# Patient Record
Sex: Male | Born: 1950
Health system: Southern US, Community
[De-identification: ages and names within clinical notes are randomized; demographics above are authoritative.]

## PROBLEM LIST (undated history)

## (undated) DIAGNOSIS — K219 Gastro-esophageal reflux disease without esophagitis: Secondary | ICD-10-CM

## (undated) DIAGNOSIS — I10 Essential (primary) hypertension: Secondary | ICD-10-CM

## (undated) DIAGNOSIS — C801 Malignant (primary) neoplasm, unspecified: Secondary | ICD-10-CM

## (undated) DIAGNOSIS — E785 Hyperlipidemia, unspecified: Secondary | ICD-10-CM

## (undated) HISTORY — PX: TONSILLECTOMY: SUR1361

## (undated) HISTORY — PX: EYE SURGERY: SHX253

---

## 2000-01-31 ENCOUNTER — Encounter: Payer: Self-pay | Admitting: Family Medicine

## 2000-01-31 ENCOUNTER — Encounter: Admission: RE | Admit: 2000-01-31 | Discharge: 2000-01-31 | Payer: Self-pay | Admitting: Family Medicine

## 2000-02-22 ENCOUNTER — Encounter: Payer: Self-pay | Admitting: Otolaryngology

## 2000-02-22 ENCOUNTER — Encounter: Admission: RE | Admit: 2000-02-22 | Discharge: 2000-02-22 | Payer: Self-pay | Admitting: Otolaryngology

## 2000-02-24 ENCOUNTER — Ambulatory Visit (HOSPITAL_BASED_OUTPATIENT_CLINIC_OR_DEPARTMENT_OTHER): Admission: RE | Admit: 2000-02-24 | Discharge: 2000-02-24 | Payer: Self-pay | Admitting: Otolaryngology

## 2000-07-17 ENCOUNTER — Encounter: Admission: RE | Admit: 2000-07-17 | Discharge: 2000-07-17 | Payer: Self-pay | Admitting: Family Medicine

## 2000-07-17 ENCOUNTER — Encounter: Payer: Self-pay | Admitting: Family Medicine

## 2001-03-07 ENCOUNTER — Encounter: Admission: RE | Admit: 2001-03-07 | Discharge: 2001-03-07 | Payer: Self-pay | Admitting: Family Medicine

## 2001-03-07 ENCOUNTER — Encounter: Payer: Self-pay | Admitting: Family Medicine

## 2001-04-17 ENCOUNTER — Encounter: Payer: Self-pay | Admitting: Family Medicine

## 2001-04-17 ENCOUNTER — Encounter: Admission: RE | Admit: 2001-04-17 | Discharge: 2001-04-17 | Payer: Self-pay | Admitting: Family Medicine

## 2001-04-23 ENCOUNTER — Ambulatory Visit (HOSPITAL_COMMUNITY): Admission: RE | Admit: 2001-04-23 | Discharge: 2001-04-23 | Payer: Self-pay | Admitting: Gastroenterology

## 2001-11-23 ENCOUNTER — Encounter: Payer: Self-pay | Admitting: Family Medicine

## 2001-11-23 ENCOUNTER — Encounter: Admission: RE | Admit: 2001-11-23 | Discharge: 2001-11-23 | Payer: Self-pay | Admitting: Family Medicine

## 2001-12-07 ENCOUNTER — Ambulatory Visit (HOSPITAL_COMMUNITY): Admission: RE | Admit: 2001-12-07 | Discharge: 2001-12-07 | Payer: Self-pay | Admitting: Gastroenterology

## 2002-06-11 ENCOUNTER — Ambulatory Visit (HOSPITAL_COMMUNITY): Admission: RE | Admit: 2002-06-11 | Discharge: 2002-06-11 | Payer: Self-pay | Admitting: Gastroenterology

## 2002-07-25 ENCOUNTER — Encounter: Payer: Self-pay | Admitting: Surgery

## 2002-07-25 ENCOUNTER — Ambulatory Visit (HOSPITAL_COMMUNITY): Admission: RE | Admit: 2002-07-25 | Discharge: 2002-07-25 | Payer: Self-pay | Admitting: Surgery

## 2002-08-26 ENCOUNTER — Encounter: Payer: Self-pay | Admitting: Surgery

## 2002-08-31 ENCOUNTER — Inpatient Hospital Stay (HOSPITAL_COMMUNITY): Admission: RE | Admit: 2002-08-31 | Discharge: 2002-09-02 | Payer: Self-pay | Admitting: Surgery

## 2002-11-26 ENCOUNTER — Encounter: Admission: RE | Admit: 2002-11-26 | Discharge: 2002-11-26 | Payer: Self-pay | Admitting: Family Medicine

## 2002-12-06 ENCOUNTER — Encounter: Admission: RE | Admit: 2002-12-06 | Discharge: 2002-12-06 | Payer: Self-pay | Admitting: Otolaryngology

## 2004-03-10 ENCOUNTER — Encounter: Admission: RE | Admit: 2004-03-10 | Discharge: 2004-03-10 | Payer: Self-pay | Admitting: Family Medicine

## 2006-04-26 ENCOUNTER — Encounter: Admission: RE | Admit: 2006-04-26 | Discharge: 2006-04-26 | Payer: Self-pay | Admitting: Family Medicine

## 2010-02-08 ENCOUNTER — Encounter: Payer: Self-pay | Admitting: Family Medicine

## 2011-08-30 ENCOUNTER — Other Ambulatory Visit: Payer: Self-pay | Admitting: Gastroenterology

## 2013-01-14 ENCOUNTER — Other Ambulatory Visit: Payer: Self-pay | Admitting: Family Medicine

## 2013-01-14 ENCOUNTER — Ambulatory Visit
Admission: RE | Admit: 2013-01-14 | Discharge: 2013-01-14 | Disposition: A | Discharge status: Home / Self Care | Payer: BC Managed Care – PPO | Source: Ambulatory Visit | Attending: Family Medicine | Admitting: Family Medicine

## 2013-01-14 DIAGNOSIS — R053 Chronic cough: Secondary | ICD-10-CM

## 2013-01-14 DIAGNOSIS — R05 Cough: Secondary | ICD-10-CM

## 2014-12-08 ENCOUNTER — Encounter: Payer: Self-pay | Admitting: Internal Medicine

## 2017-02-03 DIAGNOSIS — J209 Acute bronchitis, unspecified: Secondary | ICD-10-CM | POA: Diagnosis not present

## 2017-02-03 DIAGNOSIS — J0100 Acute maxillary sinusitis, unspecified: Secondary | ICD-10-CM | POA: Diagnosis not present

## 2017-02-08 DIAGNOSIS — R69 Illness, unspecified: Secondary | ICD-10-CM | POA: Diagnosis not present

## 2017-03-07 DIAGNOSIS — Z86010 Personal history of colonic polyps: Secondary | ICD-10-CM | POA: Diagnosis not present

## 2017-03-22 DIAGNOSIS — J4530 Mild persistent asthma, uncomplicated: Secondary | ICD-10-CM | POA: Diagnosis not present

## 2017-03-22 DIAGNOSIS — I10 Essential (primary) hypertension: Secondary | ICD-10-CM | POA: Diagnosis not present

## 2017-03-22 DIAGNOSIS — R69 Illness, unspecified: Secondary | ICD-10-CM | POA: Diagnosis not present

## 2017-05-01 DIAGNOSIS — R69 Illness, unspecified: Secondary | ICD-10-CM | POA: Diagnosis not present

## 2017-05-23 DIAGNOSIS — I10 Essential (primary) hypertension: Secondary | ICD-10-CM | POA: Diagnosis not present

## 2017-05-23 DIAGNOSIS — Z7951 Long term (current) use of inhaled steroids: Secondary | ICD-10-CM | POA: Diagnosis not present

## 2017-05-23 DIAGNOSIS — N529 Male erectile dysfunction, unspecified: Secondary | ICD-10-CM | POA: Diagnosis not present

## 2017-05-23 DIAGNOSIS — N400 Enlarged prostate without lower urinary tract symptoms: Secondary | ICD-10-CM | POA: Diagnosis not present

## 2017-05-23 DIAGNOSIS — G4700 Insomnia, unspecified: Secondary | ICD-10-CM | POA: Diagnosis not present

## 2017-05-23 DIAGNOSIS — J309 Allergic rhinitis, unspecified: Secondary | ICD-10-CM | POA: Diagnosis not present

## 2017-05-23 DIAGNOSIS — E7800 Pure hypercholesterolemia, unspecified: Secondary | ICD-10-CM | POA: Diagnosis not present

## 2017-05-23 DIAGNOSIS — K219 Gastro-esophageal reflux disease without esophagitis: Secondary | ICD-10-CM | POA: Diagnosis not present

## 2017-05-23 DIAGNOSIS — R69 Illness, unspecified: Secondary | ICD-10-CM | POA: Diagnosis not present

## 2017-05-23 DIAGNOSIS — J42 Unspecified chronic bronchitis: Secondary | ICD-10-CM | POA: Diagnosis not present

## 2017-08-02 DIAGNOSIS — J209 Acute bronchitis, unspecified: Secondary | ICD-10-CM | POA: Diagnosis not present

## 2017-08-02 DIAGNOSIS — J4530 Mild persistent asthma, uncomplicated: Secondary | ICD-10-CM | POA: Diagnosis not present

## 2017-08-02 DIAGNOSIS — B3789 Other sites of candidiasis: Secondary | ICD-10-CM | POA: Diagnosis not present

## 2017-08-02 DIAGNOSIS — J019 Acute sinusitis, unspecified: Secondary | ICD-10-CM | POA: Diagnosis not present

## 2017-08-15 DIAGNOSIS — R69 Illness, unspecified: Secondary | ICD-10-CM | POA: Diagnosis not present

## 2017-10-03 DIAGNOSIS — I10 Essential (primary) hypertension: Secondary | ICD-10-CM | POA: Diagnosis not present

## 2017-10-03 DIAGNOSIS — R311 Benign essential microscopic hematuria: Secondary | ICD-10-CM | POA: Diagnosis not present

## 2017-10-03 DIAGNOSIS — Z0000 Encounter for general adult medical examination without abnormal findings: Secondary | ICD-10-CM | POA: Diagnosis not present

## 2017-10-03 DIAGNOSIS — R319 Hematuria, unspecified: Secondary | ICD-10-CM | POA: Diagnosis not present

## 2017-10-03 DIAGNOSIS — R7302 Impaired glucose tolerance (oral): Secondary | ICD-10-CM | POA: Diagnosis not present

## 2017-10-03 DIAGNOSIS — R3129 Other microscopic hematuria: Secondary | ICD-10-CM | POA: Diagnosis not present

## 2017-10-03 DIAGNOSIS — R7301 Impaired fasting glucose: Secondary | ICD-10-CM | POA: Diagnosis not present

## 2017-10-03 DIAGNOSIS — Z125 Encounter for screening for malignant neoplasm of prostate: Secondary | ICD-10-CM | POA: Diagnosis not present

## 2017-10-03 DIAGNOSIS — E782 Mixed hyperlipidemia: Secondary | ICD-10-CM | POA: Diagnosis not present

## 2017-10-10 DIAGNOSIS — E782 Mixed hyperlipidemia: Secondary | ICD-10-CM | POA: Diagnosis not present

## 2017-10-10 DIAGNOSIS — I493 Ventricular premature depolarization: Secondary | ICD-10-CM | POA: Diagnosis not present

## 2017-10-10 DIAGNOSIS — J449 Chronic obstructive pulmonary disease, unspecified: Secondary | ICD-10-CM | POA: Diagnosis not present

## 2017-10-10 DIAGNOSIS — K219 Gastro-esophageal reflux disease without esophagitis: Secondary | ICD-10-CM | POA: Diagnosis not present

## 2017-10-10 DIAGNOSIS — Z0000 Encounter for general adult medical examination without abnormal findings: Secondary | ICD-10-CM | POA: Diagnosis not present

## 2017-10-11 DIAGNOSIS — R69 Illness, unspecified: Secondary | ICD-10-CM | POA: Diagnosis not present

## 2017-10-11 DIAGNOSIS — Z1159 Encounter for screening for other viral diseases: Secondary | ICD-10-CM | POA: Diagnosis not present

## 2017-10-25 DIAGNOSIS — R69 Illness, unspecified: Secondary | ICD-10-CM | POA: Diagnosis not present

## 2017-11-06 DIAGNOSIS — H40023 Open angle with borderline findings, high risk, bilateral: Secondary | ICD-10-CM | POA: Diagnosis not present

## 2017-11-06 DIAGNOSIS — H524 Presbyopia: Secondary | ICD-10-CM | POA: Diagnosis not present

## 2018-02-19 DIAGNOSIS — R69 Illness, unspecified: Secondary | ICD-10-CM | POA: Diagnosis not present

## 2018-02-20 DIAGNOSIS — R69 Illness, unspecified: Secondary | ICD-10-CM | POA: Diagnosis not present

## 2018-03-08 ENCOUNTER — Other Ambulatory Visit: Payer: Self-pay | Admitting: Family Medicine

## 2018-03-08 ENCOUNTER — Ambulatory Visit
Admission: RE | Admit: 2018-03-08 | Discharge: 2018-03-08 | Disposition: A | Discharge status: Home / Self Care | Payer: Medicare HMO | Source: Ambulatory Visit | Attending: Family Medicine | Admitting: Family Medicine

## 2018-03-08 DIAGNOSIS — S39012A Strain of muscle, fascia and tendon of lower back, initial encounter: Secondary | ICD-10-CM | POA: Diagnosis not present

## 2018-03-08 DIAGNOSIS — S90211A Contusion of right great toe with damage to nail, initial encounter: Secondary | ICD-10-CM | POA: Diagnosis not present

## 2018-03-08 DIAGNOSIS — L03031 Cellulitis of right toe: Secondary | ICD-10-CM | POA: Diagnosis not present

## 2018-03-08 DIAGNOSIS — S90111A Contusion of right great toe without damage to nail, initial encounter: Secondary | ICD-10-CM

## 2018-03-08 DIAGNOSIS — S92421A Displaced fracture of distal phalanx of right great toe, initial encounter for closed fracture: Secondary | ICD-10-CM | POA: Diagnosis not present

## 2018-03-09 ENCOUNTER — Ambulatory Visit (INDEPENDENT_AMBULATORY_CARE_PROVIDER_SITE_OTHER): Payer: Medicare HMO | Admitting: Orthopaedic Surgery

## 2018-03-09 DIAGNOSIS — S92424A Nondisplaced fracture of distal phalanx of right great toe, initial encounter for closed fracture: Secondary | ICD-10-CM | POA: Diagnosis not present

## 2018-03-09 DIAGNOSIS — Z23 Encounter for immunization: Secondary | ICD-10-CM | POA: Diagnosis not present

## 2018-03-09 MED ORDER — AMOXICILLIN-POT CLAVULANATE 875-125 MG PO TABS: 1.0000 | ORAL_TABLET | Freq: Two times a day (BID) | ORAL | 0 refills | Status: DC

## 2018-03-09 NOTE — Progress Notes (Signed)
Office Visit Note   Patient: Jerry Cook           Date of Birth: [birth year]           MRN: [id number] Visit Date: 03/09/2018              Requested by: Jerry Late, MD [address] [place], [place] [address] PCP: Jerry Late, MD   Assessment & Plan: Visit Diagnoses:  1. Nondisplaced fracture of distal phalanx of right great toe, initial encounter for closed fracture     Plan: Impression is subacute right great toe nailbed and nail injury.  History is consistent with a nailbed hematoma which he evacuated himself.  I will change him to Augmentin for 10 days.  He is to stop taking the Keflex.  We discussed that the current nail will fall off and a new nail will take several months to grow in.  We will keep the current nail on in order to protect the nailbed.  The x-rays are consistent with a nondisplaced distal phalanx fracture.  The air on the x-ray is consistent with nailbed injury which allowed air to escape into the toe.  This should should heal fine without any issues.  He is likely to have sensitivity to the toe for several months.  I would like to recheck him in 2 weeks.  Questions encouraged and answered.  Follow-Up Instructions: Return in about 2 weeks (around 03/23/2018).   Orders:  No orders of the defined types were placed in this encounter.  Meds ordered this encounter  Medications  . amoxicillin-clavulanate (AUGMENTIN) 875-125 MG tablet    Sig: Take 1 tablet by mouth 2 (two) times daily.    Dispense:  14 tablet    Refill:  0      Procedures: No procedures performed   Clinical Data: No additional findings.   Subjective: No chief complaint on file.   Jerry Cook is a 68 year old gentleman who has been sent over today as an urgent consult from his PCPs office Dr. Sandi Cook for evaluation of right great toe injury.  He works at the [employer] on [address] and was [employer] when he stubbed his right great toe on a wheel.  He subsequently  developed significant swelling and a blood blister which he then decompressed himself with a sterilized needle.  He endorses soreness and discomfort and swelling in the right great toe.  He denies any constitutional symptoms.  Denies any continued drainage.  Initially there was bloody drainage from the hematoma.  The swelling is overall improved from the initial injury.  He was seen by his PCP today and x-rays were also taken.  He was just started on Keflex.  He is not diabetic.   Review of Systems  Constitutional: Negative.   All other systems reviewed and are negative.    Objective: Vital Signs: There were no vitals taken for this visit.  Physical Exam Vitals signs and nursing note reviewed.  Constitutional:      Appearance: He is well-developed.  HENT:     Head: Normocephalic and atraumatic.  Eyes:     Pupils: Pupils are equal, round, and reactive to light.  Neck:     Musculoskeletal: Neck supple.  Pulmonary:     Effort: Pulmonary effort is normal.  Abdominal:     Palpations: Abdomen is soft.  Musculoskeletal: Normal range of motion.  Skin:    General: Skin is warm.  Neurological:     Mental Status: He is  alert and oriented to person, place, and time.  Psychiatric:        Behavior: Behavior normal.        Thought Content: Thought content normal.        Judgment: Judgment normal.     Ortho Exam Right great toe exam shows mild swelling and a purplish hue consistent with mild venous congestion.  There is good blood flow to the toe with normal capillary refill.  Sensation is intact.  He is tender around the cuticle.  The nail appears to be injured and the findings are consistent with a subacute nailbed injury with nailbed hematoma.  There is no signs of infection.  The nail is adhered to the nailbed. Specialty Comments:  No specialty comments available.  Imaging: Dg Toe Great Right  Result Date: 03/08/2018 CLINICAL DATA:  68 year old male with a history of first toe  injury EXAM: RIGHT GREAT TOE COMPARISON:  None. FINDINGS: There is a fracture of the distal tuft of the distal phalanx of the first toe right foot. Minimally displaced. There is rounded lucency on the dorsal aspect of the great toe, centered within the soft tissues underneath the nail. No radiopaque foreign body. IMPRESSION: Fracture of the distal tuft of the distal phalanx first toe, right foot. There is lucency within the soft tissues of the dorsal first toe, which appears underneath the nail bed. The etiology of the gas is uncertain, however, differential would include penetrating injury and thus definition of an open fracture, or gas-forming organism in the setting of infection. Electronically Signed   By: Corrie Mckusick D.O.   On: 03/08/2018 17:00     PMFS History: There are no active problems to display for this patient.  No past medical history on file.  No family history on file.   Social History   Occupational History  . Not on file  Tobacco Use  . Smoking status: Not on file  Substance and Sexual Activity  . Alcohol use: Not on file  . Drug use: Not on file  . Sexual activity: Not on file

## 2018-03-23 ENCOUNTER — Encounter (INDEPENDENT_AMBULATORY_CARE_PROVIDER_SITE_OTHER): Payer: Self-pay | Admitting: Orthopaedic Surgery

## 2018-03-23 ENCOUNTER — Ambulatory Visit (INDEPENDENT_AMBULATORY_CARE_PROVIDER_SITE_OTHER): Payer: Medicare HMO | Admitting: Orthopaedic Surgery

## 2018-03-23 DIAGNOSIS — S92424A Nondisplaced fracture of distal phalanx of right great toe, initial encounter for closed fracture: Secondary | ICD-10-CM

## 2018-03-23 DIAGNOSIS — S92424D Nondisplaced fracture of distal phalanx of right great toe, subsequent encounter for fracture with routine healing: Secondary | ICD-10-CM

## 2018-03-23 NOTE — Progress Notes (Signed)
    Patient: Jerry Cook           Date of Birth: 08-23-1950           MRN: [id number] Visit Date: 03/23/2018 PCP: Derinda Late, MD   Assessment & Plan:  Chief Complaint:  Chief Complaint  Patient presents with  . Right Great Toe - Pain   Visit Diagnoses:  1. Nondisplaced fracture of distal phalanx of right great toe, initial encounter for closed fracture     Plan: Jerry Cook returns today for follow-up of his great toe injury.  He is feeling much better.  He has completed his course of Augmentin.  Denies any constitutional symptoms. His toenail is now become detached from the cuticle.  I can see that a new nail is starting to form underneath.  There is no evidence of infection.  His toe is mildly tender.  Overall the swelling is improved.  At this point he may begin weaning out of the postop shoe to a regular shoe as tolerated.  He is doing well from my standpoint.  I would like to recheck in 1 more time in about 6 weeks.  We would expect the old nail to slough off over the next couple months as the new nail was coming in.  Follow-Up Instructions: Return in about 6 weeks (around 05/04/2018).   Orders:  No orders of the defined types were placed in this encounter.  No orders of the defined types were placed in this encounter.   Imaging: No results found.  PMFS History: There are no active problems to display for this patient.  History reviewed. No pertinent past medical history.  History reviewed. No pertinent family history.  History reviewed. No pertinent surgical history. Social History   Occupational History  . Not on file  Tobacco Use  . Smoking status: Not on file  Substance and Sexual Activity  . Alcohol use: Not on file  . Drug use: Not on file  . Sexual activity: Not on file

## 2018-04-11 DIAGNOSIS — R75 Inconclusive laboratory evidence of human immunodeficiency virus [HIV]: Secondary | ICD-10-CM | POA: Diagnosis not present

## 2018-04-11 DIAGNOSIS — R972 Elevated prostate specific antigen [PSA]: Secondary | ICD-10-CM | POA: Diagnosis not present

## 2018-05-04 ENCOUNTER — Ambulatory Visit (INDEPENDENT_AMBULATORY_CARE_PROVIDER_SITE_OTHER): Payer: Medicare HMO | Admitting: Orthopaedic Surgery

## 2018-05-08 ENCOUNTER — Ambulatory Visit (INDEPENDENT_AMBULATORY_CARE_PROVIDER_SITE_OTHER): Payer: Medicare HMO

## 2018-05-08 ENCOUNTER — Other Ambulatory Visit: Payer: Self-pay

## 2018-05-08 ENCOUNTER — Ambulatory Visit (INDEPENDENT_AMBULATORY_CARE_PROVIDER_SITE_OTHER): Payer: Medicare HMO | Admitting: Orthopaedic Surgery

## 2018-05-08 DIAGNOSIS — S92424A Nondisplaced fracture of distal phalanx of right great toe, initial encounter for closed fracture: Secondary | ICD-10-CM | POA: Diagnosis not present

## 2018-05-08 NOTE — Progress Notes (Signed)
   Office Visit Note   Patient: Jerry Cook           Date of Birth: [birth year]           MRN: [id number] Visit Date: 05/08/2018              Requested by: Derinda Late, MD [address] [place], [place] [address] PCP: Derinda Late, MD   Assessment & Plan: Visit Diagnoses:  1. Nondisplaced fracture of distal phalanx of right great toe, initial encounter for closed fracture     Plan: Impression is healed right great toe distal phalanx fracture.  His toenail has almost fully grown in.  He will continue to advance with activity as tolerated.  Call with concerns or questions.  Follow-up with [place] as needed.  Follow-Up Instructions: Return if symptoms worsen or fail to improve.   Orders:  Orders Placed This Encounter  Procedures  . XR Toe Great Right   No orders of the defined types were placed in this encounter.     Procedures: No procedures performed   Clinical Data: No additional findings.   Subjective: Chief Complaint  Patient presents with  . Right Great Toe - Follow-up    HPI patient is a pleasant 68 year old gentleman who presents our clinic today approximately 2 months status post right great toe distal phalanx fracture, date of injury 03/08/2018.  He has been doing well.  He notes that his toenail fell off approximately 2 weeks ago which is relieved a lot of pressure.  No fevers or chills.  Minimal pain.  Weightbearing in regular shoe without any issues.    Review of Systems as detailed in HPI.  All others reviewed and are negative.   Objective: Vital Signs: There were no vitals taken for this visit.  Physical Exam well-developed well-nourished gentleman in no acute distress.  Alert and oriented x3.  Ortho Exam examination of the right great toe reveals minimal diffuse erythema.  Very minimal tenderness throughout.  No drainage.  No signs of infection.  His toenail has almost fully grown in.  Neurovascular intact distally.  Specialty Comments:  No  specialty comments available.  Imaging: Xr Toe Great Right  Result Date: 05/08/2018 X-rays demonstrate stable alignment of the fracture with significant bony callus formation    PMFS History: Patient Active Problem List   Diagnosis Date Noted  . Nondisplaced fracture of distal phalanx of right great toe, initial encounter for closed fracture 05/08/2018   No past medical history on file.  No family history on file.  No past surgical history on file. Social History   Occupational History  . Not on file  Tobacco Use  . Smoking status: Not on file  Substance and Sexual Activity  . Alcohol use: Not on file  . Drug use: Not on file  . Sexual activity: Not on file

## 2018-06-22 DIAGNOSIS — R69 Illness, unspecified: Secondary | ICD-10-CM | POA: Diagnosis not present

## 2018-09-06 DIAGNOSIS — R69 Illness, unspecified: Secondary | ICD-10-CM | POA: Diagnosis not present

## 2018-10-10 DIAGNOSIS — R69 Illness, unspecified: Secondary | ICD-10-CM | POA: Diagnosis not present

## 2019-01-14 DIAGNOSIS — N401 Enlarged prostate with lower urinary tract symptoms: Secondary | ICD-10-CM | POA: Diagnosis not present

## 2019-01-14 DIAGNOSIS — Z125 Encounter for screening for malignant neoplasm of prostate: Secondary | ICD-10-CM | POA: Diagnosis not present

## 2019-01-14 DIAGNOSIS — E782 Mixed hyperlipidemia: Secondary | ICD-10-CM | POA: Diagnosis not present

## 2019-01-14 DIAGNOSIS — R69 Illness, unspecified: Secondary | ICD-10-CM | POA: Diagnosis not present

## 2019-01-14 DIAGNOSIS — N528 Other male erectile dysfunction: Secondary | ICD-10-CM | POA: Diagnosis not present

## 2019-01-14 DIAGNOSIS — K219 Gastro-esophageal reflux disease without esophagitis: Secondary | ICD-10-CM | POA: Diagnosis not present

## 2019-01-14 DIAGNOSIS — Z0000 Encounter for general adult medical examination without abnormal findings: Secondary | ICD-10-CM | POA: Diagnosis not present

## 2019-01-14 DIAGNOSIS — I10 Essential (primary) hypertension: Secondary | ICD-10-CM | POA: Diagnosis not present

## 2019-01-14 DIAGNOSIS — R351 Nocturia: Secondary | ICD-10-CM | POA: Diagnosis not present

## 2019-01-14 DIAGNOSIS — R739 Hyperglycemia, unspecified: Secondary | ICD-10-CM | POA: Diagnosis not present

## 2019-01-21 DIAGNOSIS — H40023 Open angle with borderline findings, high risk, bilateral: Secondary | ICD-10-CM | POA: Diagnosis not present

## 2019-01-21 DIAGNOSIS — H524 Presbyopia: Secondary | ICD-10-CM | POA: Diagnosis not present

## 2019-03-05 DIAGNOSIS — R69 Illness, unspecified: Secondary | ICD-10-CM | POA: Diagnosis not present

## 2019-03-20 DIAGNOSIS — S39012A Strain of muscle, fascia and tendon of lower back, initial encounter: Secondary | ICD-10-CM | POA: Diagnosis not present

## 2019-04-18 DIAGNOSIS — M545 Low back pain: Secondary | ICD-10-CM | POA: Diagnosis not present

## 2019-04-20 DIAGNOSIS — Z008 Encounter for other general examination: Secondary | ICD-10-CM | POA: Diagnosis not present

## 2019-04-20 DIAGNOSIS — N400 Enlarged prostate without lower urinary tract symptoms: Secondary | ICD-10-CM | POA: Diagnosis not present

## 2019-04-20 DIAGNOSIS — E663 Overweight: Secondary | ICD-10-CM | POA: Diagnosis not present

## 2019-04-20 DIAGNOSIS — R69 Illness, unspecified: Secondary | ICD-10-CM | POA: Diagnosis not present

## 2019-04-20 DIAGNOSIS — I10 Essential (primary) hypertension: Secondary | ICD-10-CM | POA: Diagnosis not present

## 2019-04-20 DIAGNOSIS — G4700 Insomnia, unspecified: Secondary | ICD-10-CM | POA: Diagnosis not present

## 2019-04-20 DIAGNOSIS — M62838 Other muscle spasm: Secondary | ICD-10-CM | POA: Diagnosis not present

## 2019-04-20 DIAGNOSIS — I739 Peripheral vascular disease, unspecified: Secondary | ICD-10-CM | POA: Diagnosis not present

## 2019-04-20 DIAGNOSIS — J309 Allergic rhinitis, unspecified: Secondary | ICD-10-CM | POA: Diagnosis not present

## 2019-04-20 DIAGNOSIS — K219 Gastro-esophageal reflux disease without esophagitis: Secondary | ICD-10-CM | POA: Diagnosis not present

## 2019-04-20 DIAGNOSIS — E785 Hyperlipidemia, unspecified: Secondary | ICD-10-CM | POA: Diagnosis not present

## 2019-06-13 DIAGNOSIS — Z0100 Encounter for examination of eyes and vision without abnormal findings: Secondary | ICD-10-CM | POA: Diagnosis not present

## 2019-09-18 DIAGNOSIS — R69 Illness, unspecified: Secondary | ICD-10-CM | POA: Diagnosis not present

## 2019-10-15 DIAGNOSIS — R69 Illness, unspecified: Secondary | ICD-10-CM | POA: Diagnosis not present

## 2019-12-23 DIAGNOSIS — R69 Illness, unspecified: Secondary | ICD-10-CM | POA: Diagnosis not present

## 2020-01-28 DIAGNOSIS — E782 Mixed hyperlipidemia: Secondary | ICD-10-CM | POA: Diagnosis not present

## 2020-01-28 DIAGNOSIS — I10 Essential (primary) hypertension: Secondary | ICD-10-CM | POA: Diagnosis not present

## 2020-01-28 DIAGNOSIS — Z125 Encounter for screening for malignant neoplasm of prostate: Secondary | ICD-10-CM | POA: Diagnosis not present

## 2020-01-28 DIAGNOSIS — K219 Gastro-esophageal reflux disease without esophagitis: Secondary | ICD-10-CM | POA: Diagnosis not present

## 2020-01-28 DIAGNOSIS — Z1159 Encounter for screening for other viral diseases: Secondary | ICD-10-CM | POA: Diagnosis not present

## 2020-01-28 DIAGNOSIS — R69 Illness, unspecified: Secondary | ICD-10-CM | POA: Diagnosis not present

## 2020-01-28 DIAGNOSIS — N528 Other male erectile dysfunction: Secondary | ICD-10-CM | POA: Diagnosis not present

## 2020-01-28 DIAGNOSIS — N401 Enlarged prostate with lower urinary tract symptoms: Secondary | ICD-10-CM | POA: Diagnosis not present

## 2020-01-28 DIAGNOSIS — Z0000 Encounter for general adult medical examination without abnormal findings: Secondary | ICD-10-CM | POA: Diagnosis not present

## 2020-03-03 ENCOUNTER — Encounter (HOSPITAL_COMMUNITY): Payer: Self-pay | Admitting: Emergency Medicine

## 2020-03-03 ENCOUNTER — Emergency Department (HOSPITAL_COMMUNITY): Payer: Medicare HMO

## 2020-03-03 ENCOUNTER — Other Ambulatory Visit: Payer: Self-pay

## 2020-03-03 ENCOUNTER — Observation Stay (HOSPITAL_COMMUNITY)
Admission: EM | Admit: 2020-03-03 | Discharge: 2020-03-04 | Disposition: A | Discharge status: Home / Self Care | Payer: Medicare HMO | Attending: Internal Medicine | Admitting: Internal Medicine

## 2020-03-03 ENCOUNTER — Ambulatory Visit (HOSPITAL_COMMUNITY)
Admission: EM | Admit: 2020-03-03 | Discharge: 2020-03-03 | Disposition: A | Discharge status: Home / Self Care | Payer: Medicare HMO

## 2020-03-03 DIAGNOSIS — R042 Hemoptysis: Secondary | ICD-10-CM | POA: Diagnosis not present

## 2020-03-03 DIAGNOSIS — K921 Melena: Secondary | ICD-10-CM

## 2020-03-03 DIAGNOSIS — Z87891 Personal history of nicotine dependence: Secondary | ICD-10-CM | POA: Insufficient documentation

## 2020-03-03 DIAGNOSIS — C01 Malignant neoplasm of base of tongue: Secondary | ICD-10-CM | POA: Diagnosis not present

## 2020-03-03 DIAGNOSIS — C770 Secondary and unspecified malignant neoplasm of lymph nodes of head, face and neck: Secondary | ICD-10-CM | POA: Diagnosis not present

## 2020-03-03 DIAGNOSIS — R591 Generalized enlarged lymph nodes: Secondary | ICD-10-CM

## 2020-03-03 DIAGNOSIS — Z79899 Other long term (current) drug therapy: Secondary | ICD-10-CM | POA: Diagnosis not present

## 2020-03-03 DIAGNOSIS — K625 Hemorrhage of anus and rectum: Secondary | ICD-10-CM | POA: Diagnosis not present

## 2020-03-03 DIAGNOSIS — Z20822 Contact with and (suspected) exposure to covid-19: Secondary | ICD-10-CM | POA: Insufficient documentation

## 2020-03-03 DIAGNOSIS — J069 Acute upper respiratory infection, unspecified: Secondary | ICD-10-CM | POA: Diagnosis not present

## 2020-03-03 DIAGNOSIS — J449 Chronic obstructive pulmonary disease, unspecified: Secondary | ICD-10-CM | POA: Diagnosis not present

## 2020-03-03 DIAGNOSIS — K922 Gastrointestinal hemorrhage, unspecified: Secondary | ICD-10-CM | POA: Diagnosis not present

## 2020-03-03 DIAGNOSIS — R0602 Shortness of breath: Secondary | ICD-10-CM | POA: Diagnosis not present

## 2020-03-03 DIAGNOSIS — I10 Essential (primary) hypertension: Secondary | ICD-10-CM | POA: Diagnosis not present

## 2020-03-03 DIAGNOSIS — K920 Hematemesis: Secondary | ICD-10-CM

## 2020-03-03 DIAGNOSIS — R599 Enlarged lymph nodes, unspecified: Secondary | ICD-10-CM | POA: Diagnosis not present

## 2020-03-03 DIAGNOSIS — C799 Secondary malignant neoplasm of unspecified site: Secondary | ICD-10-CM | POA: Diagnosis not present

## 2020-03-03 DIAGNOSIS — K118 Other diseases of salivary glands: Secondary | ICD-10-CM | POA: Diagnosis not present

## 2020-03-03 DIAGNOSIS — K148 Other diseases of tongue: Secondary | ICD-10-CM | POA: Diagnosis not present

## 2020-03-03 DIAGNOSIS — N400 Enlarged prostate without lower urinary tract symptoms: Secondary | ICD-10-CM

## 2020-03-03 DIAGNOSIS — M50222 Other cervical disc displacement at C5-C6 level: Secondary | ICD-10-CM | POA: Diagnosis not present

## 2020-03-03 HISTORY — DX: Hyperlipidemia, unspecified: E78.5

## 2020-03-03 HISTORY — DX: Essential (primary) hypertension: I10

## 2020-03-03 LAB — CBC
HCT: 37 % — ABNORMAL LOW (ref 39.0–52.0)
Hemoglobin: 12 g/dL — ABNORMAL LOW (ref 13.0–17.0)
MCH: 29.6 pg (ref 26.0–34.0)
MCHC: 32.4 g/dL (ref 30.0–36.0)
MCV: 91.1 fL (ref 80.0–100.0)
Platelets: 155 10*3/uL (ref 150–400)
RBC: 4.06 MIL/uL — ABNORMAL LOW (ref 4.22–5.81)
RDW: 12.5 % (ref 11.5–15.5)
WBC: 9 10*3/uL (ref 4.0–10.5)
nRBC: 0 % (ref 0.0–0.2)

## 2020-03-03 LAB — COMPREHENSIVE METABOLIC PANEL
ALT: 32 U/L (ref 0–44)
AST: 27 U/L (ref 15–41)
Albumin: 3.3 g/dL — ABNORMAL LOW (ref 3.5–5.0)
Alkaline Phosphatase: 59 U/L (ref 38–126)
Anion gap: 9 (ref 5–15)
BUN: 23 mg/dL (ref 8–23)
CO2: 29 mmol/L (ref 22–32)
Calcium: 8.6 mg/dL — ABNORMAL LOW (ref 8.9–10.3)
Chloride: 104 mmol/L (ref 98–111)
Creatinine, Ser: 1.17 mg/dL (ref 0.61–1.24)
GFR, Estimated: >60 mL/min (ref >60)
Glucose, Bld: 110 mg/dL — ABNORMAL HIGH (ref 70–99)
Potassium: 3.5 mmol/L (ref 3.5–5.1)
Sodium: 142 mmol/L (ref 135–145)
Total Bilirubin: 0.5 mg/dL (ref 0.3–1.2)
Total Protein: 6 g/dL — ABNORMAL LOW (ref 6.5–8.1)

## 2020-03-03 LAB — POC OCCULT BLOOD, ED: Fecal Occult Bld: POSITIVE — AB

## 2020-03-03 LAB — PROTIME-INR
INR: 1.2 (ref 0.8–1.2)
Prothrombin Time: 14.4 seconds (ref 11.4–15.2)

## 2020-03-03 LAB — TYPE AND SCREEN
ABO/RH(D): O POS
Antibody Screen: NEGATIVE

## 2020-03-03 LAB — SARS CORONAVIRUS 2 (TAT 6-24 HRS): SARS Coronavirus 2: NEGATIVE

## 2020-03-03 MED ORDER — FINASTERIDE 5 MG PO TABS
5.0000 mg | ORAL_TABLET | Freq: Every day | ORAL | Status: DC
  Administered 2020-03-04: 5 mg via ORAL
  Filled 2020-03-03 (×2): qty 1

## 2020-03-03 MED ORDER — ATORVASTATIN CALCIUM 10 MG PO TABS
10.0000 mg | ORAL_TABLET | Freq: Every day | ORAL | Status: DC
  Administered 2020-03-03 – 2020-03-04 (×2): 10 mg via ORAL
  Filled 2020-03-03 (×2): qty 1

## 2020-03-03 MED ORDER — MOMETASONE FURO-FORMOTEROL FUM 200-5 MCG/ACT IN AERO: 2.0000 | INHALATION_SPRAY | Freq: Two times a day (BID) | RESPIRATORY_TRACT | Status: DC

## 2020-03-03 MED ORDER — ALBUTEROL SULFATE HFA 108 (90 BASE) MCG/ACT IN AERS: 1.0000 | INHALATION_SPRAY | RESPIRATORY_TRACT | Status: DC | PRN

## 2020-03-03 MED ORDER — TRAZODONE HCL 50 MG PO TABS
50.0000 mg | ORAL_TABLET | Freq: Every day | ORAL | Status: DC
  Administered 2020-03-03: 50 mg via ORAL
  Filled 2020-03-03: qty 1

## 2020-03-03 MED ORDER — SILDENAFIL CITRATE 20 MG PO TABS: 20.0000 mg | ORAL_TABLET | Freq: Three times a day (TID) | ORAL | Status: DC

## 2020-03-03 MED ORDER — TRIAMTERENE-HCTZ 37.5-25 MG PO TABS
1.0000 | ORAL_TABLET | Freq: Every day | ORAL | Status: DC
  Filled 2020-03-03: qty 1

## 2020-03-03 MED ORDER — ACETAMINOPHEN 325 MG PO TABS: 650.0000 mg | ORAL_TABLET | Freq: Four times a day (QID) | ORAL | Status: DC | PRN

## 2020-03-03 MED ORDER — TAMSULOSIN HCL 0.4 MG PO CAPS
0.4000 mg | ORAL_CAPSULE | Freq: Every day | ORAL | Status: DC
  Administered 2020-03-03: 0.4 mg via ORAL
  Filled 2020-03-03: qty 1

## 2020-03-03 MED ORDER — IOHEXOL 300 MG/ML  SOLN
75.0000 mL | Freq: Once | INTRAMUSCULAR | Status: AC | PRN
  Administered 2020-03-03: 75 mL via INTRAVENOUS

## 2020-03-03 MED ORDER — PANTOPRAZOLE SODIUM 40 MG PO TBEC
40.0000 mg | DELAYED_RELEASE_TABLET | Freq: Every day | ORAL | Status: DC
  Administered 2020-03-04: 40 mg via ORAL
  Filled 2020-03-03: qty 1

## 2020-03-03 MED ORDER — METOPROLOL TARTRATE 12.5 MG HALF TABLET
12.5000 mg | ORAL_TABLET | Freq: Two times a day (BID) | ORAL | Status: DC
  Administered 2020-03-03 – 2020-03-04 (×2): 12.5 mg via ORAL
  Filled 2020-03-03 (×2): qty 1

## 2020-03-03 MED ORDER — PAROXETINE HCL 20 MG PO TABS
20.0000 mg | ORAL_TABLET | Freq: Every day | ORAL | Status: DC
  Administered 2020-03-04: 20 mg via ORAL
  Filled 2020-03-03 (×2): qty 1

## 2020-03-03 MED ORDER — ACETAMINOPHEN 650 MG RE SUPP: 650.0000 mg | Freq: Four times a day (QID) | RECTAL | Status: DC | PRN

## 2020-03-03 MED ORDER — TRIAMTERENE-HCTZ 37.5-25 MG PO CAPS
1.0000 | ORAL_CAPSULE | Freq: Every day | ORAL | Status: DC
  Filled 2020-03-03: qty 1

## 2020-03-03 MED ORDER — PANTOPRAZOLE SODIUM 40 MG IV SOLR
40.0000 mg | Freq: Once | INTRAVENOUS | Status: AC
  Administered 2020-03-03: 40 mg via INTRAVENOUS
  Filled 2020-03-03: qty 40

## 2020-03-03 NOTE — ED Notes (Signed)
Pt stood up to use urinal in room and had BM in pants and in bedside commode. This RN helped clean pt up, mesh underwear given to pt, pt put into clean gown. This RN hooked pt back up to monitor.

## 2020-03-03 NOTE — ED Triage Notes (Signed)
Patient here from home. Complaint of vomiting blood and dark stools for 1 week. Patient states he has hard dark stools in the past. NAD.

## 2020-03-03 NOTE — ED Triage Notes (Signed)
Pt presents with blood in sputum and dark stools with blood but not black stool xs 2-3 weeks. Denies abdominal pain, SOB or fever. Also states has swelling in lymph nodes with sore throat "that has subsided for the most part."

## 2020-03-03 NOTE — ED Provider Notes (Signed)
I assumed care of patient at shift change from [facility], please see his note for full H&P.  Briefly patient is here for evaluation of rectal bleeding and spitting out phlegm with blood in it.  Physical Exam  BP 133/71   Pulse 63   Temp (!) 97.5 F (36.4 C) (Oral)   Resp 12   SpO2 99%   Physical Exam Vitals and nursing note reviewed.  Constitutional:      General: He is not in acute distress.    Appearance: He is not diaphoretic.  HENT:     Head: Normocephalic and atraumatic.     Mouth/Throat:     Mouth: Mucous membranes are moist.     Pharynx: Oropharynx is clear.     Comments: No obvious intraoral mass or edema.  Voice is hoarse sounding.  Eyes:     General: No scleral icterus.       Right eye: No discharge.        Left eye: No discharge.     Conjunctiva/sclera: Conjunctivae normal.  Neck:     Comments: There is obvious visualized asymmetric edema of the neck.  On palpation these areas are firm consistent with markedly enlarged lymph nodes.  They are nontender. Cardiovascular:     Rate and Rhythm: Normal rate.  Pulmonary:     Effort: Pulmonary effort is normal. No respiratory distress.     Breath sounds: No stridor.  Abdominal:     General: There is no distension.  Musculoskeletal:        General: No deformity.  Skin:    General: Skin is warm and dry.  Neurological:     General: No focal deficit present.     Mental Status: He is alert and oriented to person, place, and time.     Motor: No abnormal muscle tone.  Psychiatric:     Comments: Mood and behavior are appropriate for situation     ED Course/Procedures      Procedures   DG Chest 2 View  Result Date: 03/03/2020 CLINICAL DATA:  Hemoptysis. EXAM: CHEST - 2 VIEW COMPARISON:  01/14/2013. FINDINGS: Mediastinum and hilar structures normal. Heart size normal. No focal infiltrate. No pleural effusion or pneumothorax. Degenerative change thoracic spine. IMPRESSION: No acute cardiopulmonary disease.  Electronically Signed   By: Marcello Moores  Register   On: 03/03/2020 15:14   CT Soft Tissue Neck W Contrast  Result Date: 03/03/2020 CLINICAL DATA:  Lymphadenopathy, neck. Enlarged lymph nodes, shortness of breath, hemoptysis. EXAM: CT NECK WITH CONTRAST TECHNIQUE: Multidetector CT imaging of the neck was performed using the standard protocol following the bolus administration of intravenous contrast. CONTRAST:  67mL OMNIPAQUE IOHEXOL 300 MG/ML  SOLN COMPARISON:  CT of the paranasal sinuses 04/26/2006. FINDINGS: Pharynx and larynx: Prominent streak and beam hardening artifact arising from dental restoration obscures portions of the oral cavity and pharynx. Asymmetric soft tissue within the right base of tongue and glossotonsillar sulcus regions, extending to the right vallecula and measuring 2.5 x 1.9 x 3.0 cm, likely reflecting a base of tongue squamous cell carcinoma (for instance as seen on series 3, image 31). Salivary glands: There is an ill-defined fat pain between right level 2 cystic/necrotic lymphadenopathy and portions of the right parotid and submandibular glands. The parotid and submandibular glands are otherwise unremarkable. Thyroid: Unremarkable. Lymph nodes: Extensive cystic/necrotic cervical lymphadenopathy at the right level 1, 2, 3 and 4 stations and at the left level 2 station. Cystic/necrotic right level 1 lymph nodes measure up  to 2.3 cm in short axis (series 3, image 41). Multiple enlarged cystic/necrotic right level 2 lymph nodes, including a dominant right level 2 nodal conglomerate measuring 4.9 x 4.6 cm in transaxial dimensions (series 3, image 29). This nodal conglomerate has ill-defined margins with the adjacent right sternocleidomastoid muscle, right parotid gland and right submandibular gland and findings are highly suspicious for extracapsular extension. The largest right level 3 lymph node is cystic/necrotic and measures 1.4 cm in short axis (series 3, image 58). An enlarged  cystic/necrotic right level 3/4 lymph node measures 1.7 cm in short axis (series 3, image 66). Multiple cystic/necrotic left level 2 lymph nodes measuring up to 2.3 cm in short axis (series 3, images 29 and 36). An enlarged cystic/necrotic left level 2 lymph node has an ill-defined margin with the adjacent left sternocleidomastoid muscle and findings are suspicious for extracapsular extension. Vascular: Cystic/necrotic lymphadenopathy near completely effaces the upper right internal jugular vein. Cystic/necrotic lymphadenopathy also significantly narrows the upper left internal jugular vein. Limited intracranial: No acute intracranial abnormality identified at the imaged levels. Visualized orbits: Excluded from the field of view. Mastoids and visualized paranasal sinuses: Incompletely imaged sequela of prior maxillofacial reconstructive surgery. Moderate mucosal thickening and possible fluid within the imaged right maxillary sinus. Mild mucosal thickening within the imaged left maxillary sinus. No significant mastoid effusion. Skeleton: No acute bony abnormality or aggressive osseous lesion. C4-C5 grade 1 anterolisthesis. Cervical spondylosis. Most notably, there is advanced disc space narrowing and degenerative endplate sclerosis with a disc bulge, endplate spurring and uncovertebral hypertrophy at C5-C6. Bilateral bony neural foraminal narrowing and apparent mild/moderate spinal canal stenosis at this level. Upper chest: No consolidation within the imaged lung apices. These results were called by telephone at the time of interpretation on 03/03/2020 at 4:54 pm to provider Dr. Tomi Bamberger, who verbally acknowledged these results. IMPRESSION: 2.5 x 1.9 x 3.0 cm right base of tongue and glossotonsillar sulcus mass extending to the region of the right vallecula, highly suspicious for a squamous cell carcinoma primary tumor. ENT consultation and direct visualization recommended. Extensive bilateral cystic/necrotic nodal  metastatic disease at the right level 1, 2, 3 and 4 stations and left level 2 station. Several lymph nodes demonstrate ill-defined margins highly suspicious for extracapsular extension. Lymphadenopathy significantly narrows the upper internal jugular veins bilaterally. Paranasal sinus disease as described. Electronically Signed   By: Kellie Simmering DO   On: 03/03/2020 16:55     MDM   Admit for GI bleed,  Takes PPI  Has large lymph nodes Melana/blood on exam   If Normal CXR than just scan neck.   GI is [facility].   1719: Secure chat message sent to [facility] Dr. Cristina Gong who is patient's primary GI doctor and on call.  He is added to the care team  1725: I spoke with Dr. Cristina Gong who told me that about a month ago patient had labs at [facility] and his hemoglobin was in the 15's then.  We discussed patient's symptoms, he states that patient is on their list to be seen tomorrow morning.  I spoke with Dr. Redmond Baseman of ENT.  He states that he will see patient tomorrow given that patient will be admitted and possibly undergo GI scope.  I spoke with Dr. Roosevelt Locks who will see the patient for admission.  Patient is to be kept n.p.o. at midnight.  I discussed patient's results with him including concern for tongue mass and lymph node involvement.  Considered that patient may actually be bleeding  from his mass given his reports of blood in his mouth and bloody sputum, however unable to rule out a GI source especially with reported red blood on rectal exam.  Note: Portions of this report may have been transcribed using voice recognition software. Every effort was made to ensure accuracy; however, inadvertent computerized transcription errors may be present        Ollen Gross 03/03/20 Tressie Ellis, MD 03/06/20 [id number]

## 2020-03-03 NOTE — Discharge Instructions (Signed)
Please head straight to [facility] ED for further evaluation of bloody stool and throwing up blood. It's important that you head straight there and get evaluated for your symptoms.   If you develop chest pain, dizziness, weakness, headaches, abdominal pain, etc while on the way to the ED- stop immediately and call EMS.

## 2020-03-03 NOTE — ED Provider Notes (Signed)
[place]    CSN: [id number] Arrival date & time: 03/03/20  1057      History   Chief Complaint Chief Complaint  Patient presents with  . Cough  . Rectal Bleeding    HPI Jerry Cook is a 70 y.o. male presenting with cough, rectal bleeding, vomiting blood.  History of hypertension, hyperlipidemia. Pt presents with blood in sputum, 1-2 episodes of bloody vomiting daily, and dark stools with blood. Symptoms for 2-3 weeks. Endorses dizziness with standing but states this is his baseline. Denies abdominal pain, SOB or fever. Also states has cough, sore throat, swelling in lymph nodes with sore throat "that has subsided for the most part." states he has a full physical 3 weeks ago and was feeling well at that time. Denies history colorectal or pulmonary disease.  He states his primary care told him to go to the ER, so he presented to this urgent care instead.  HPI  Past Medical History:  Diagnosis Date  . Hyperlipidemia   . Hypertension     Patient Active Problem List   Diagnosis Date Noted  . Nondisplaced fracture of distal phalanx of right great toe, initial encounter for closed fracture 05/08/2018    Past Surgical History:  Procedure Laterality Date  . TONSILLECTOMY         Home Medications    Prior to Admission medications   Medication Sig Start Date End Date Taking? Authorizing Provider  albuterol (PROVENTIL HFA;VENTOLIN HFA) 108 (90 Base) MCG/ACT inhaler  03/05/18   [provider]  amoxicillin-clavulanate (AUGMENTIN) 875-125 MG tablet Take 1 tablet by mouth 2 (two) times daily. 03/09/18   Leandrew Koyanagi, MD  atorvastatin (LIPITOR) 10 MG tablet  01/23/18   [provider]  clonazePAM Bobbye Charleston) 2 MG tablet  01/26/18   [provider]  finasteride (PROSCAR) 5 MG tablet Take 5 mg by mouth daily.    [provider]  Fluticasone-Salmeterol (ADVAIR) 250-50 MCG/DOSE AEPB Inhale 1 puff into the lungs 2 (two) times daily.     [provider]  metoprolol tartrate (LOPRESSOR) 50 MG tablet  03/05/18   [provider]  omeprazole (PRILOSEC) 20 MG capsule  03/05/18   [provider]  PARoxetine (PAXIL) 20 MG tablet Take 20 mg by mouth daily.    [provider]  sildenafil (REVATIO) 20 MG tablet Take 20 mg by mouth 3 (three) times daily.    [provider]  tamsulosin (FLOMAX) 0.4 MG CAPS capsule Take 0.4 mg by mouth.    [provider]  traZODone (DESYREL) 50 MG tablet  03/07/18   [provider]  triamterene-hydrochlorothiazide (DYAZIDE) 37.5-25 MG capsule Take 1 capsule by mouth daily.    [provider]    Family History History reviewed. No pertinent family history.  Social History Social History   Tobacco Use  . Smoking status: Former Research scientist (life sciences)  . Smokeless tobacco: Never Used  . Tobacco comment: Quit in 1981  Substance Use Topics  . Alcohol use: Yes     Allergies   Patient has no known allergies.   Review of Systems Review of Systems  Constitutional: Negative for appetite change, chills and fever.  HENT: Positive for congestion and sore throat. Negative for ear pain, rhinorrhea, sinus pressure and sinus pain.   Eyes: Negative for redness and visual disturbance.  Respiratory: Positive for cough. Negative for chest tightness, shortness of breath and wheezing.   Cardiovascular: Negative for chest pain and palpitations.  Gastrointestinal: Positive  for blood in stool and vomiting. Negative for abdominal pain, constipation, diarrhea and nausea.  Genitourinary: Negative for dysuria, frequency and urgency.  Musculoskeletal: Negative for myalgias.  Neurological: Negative for dizziness, weakness and headaches.  Psychiatric/Behavioral: Negative for confusion.  All other systems reviewed and are negative.    Physical Exam Triage Vital Signs ED Triage Vitals  Enc Vitals Group     BP 03/03/20 1147 (!) 120/46     Pulse Rate 03/03/20 1147  77     Resp 03/03/20 1147 18     Temp 03/03/20 1147 98.4 F (36.9 C)     Temp Source 03/03/20 1147 Oral     SpO2 03/03/20 1147 96 %     Weight --      Height --      Head Circumference --      Peak Flow --      Pain Score 03/03/20 1142 0     Pain Loc --      Pain Edu? --      Excl. in [place]? --    No data found.  Updated Vital Signs BP (!) 120/46   Pulse 77   Temp 98.4 F (36.9 C) (Oral)   Resp 18   SpO2 96%   Visual Acuity Right Eye Distance:   Left Eye Distance:   Bilateral Distance:    Right Eye Near:   Left Eye Near:    Bilateral Near:     Physical Exam Vitals reviewed.  Constitutional:      General: He is not in acute distress.    Appearance: Normal appearance. He is not ill-appearing.  HENT:     Head: Normocephalic and atraumatic.     Right Ear: Hearing, tympanic membrane, ear canal and external ear normal. No swelling or tenderness. There is no impacted cerumen. No mastoid tenderness. Tympanic membrane is not perforated, erythematous, retracted or bulging.     Left Ear: Hearing, tympanic membrane, ear canal and external ear normal. No swelling or tenderness. There is no impacted cerumen. No mastoid tenderness. Tympanic membrane is not perforated, erythematous, retracted or bulging.     Nose:     Right Sinus: No maxillary sinus tenderness or frontal sinus tenderness.     Left Sinus: No maxillary sinus tenderness or frontal sinus tenderness.     Mouth/Throat:     Mouth: Mucous membranes are moist.     Pharynx: Uvula midline. No oropharyngeal exudate or posterior oropharyngeal erythema.     Tonsils: No tonsillar exudate.  Cardiovascular:     Rate and Rhythm: Normal rate and regular rhythm.     Heart sounds: Normal heart sounds.  Pulmonary:     Effort: Pulmonary effort is normal.     Breath sounds: Normal breath sounds and air entry. No wheezing, rhonchi or rales.  Chest:     Chest wall: No tenderness.  Abdominal:     General: Abdomen is flat. Bowel sounds  are normal. There is no distension.     Palpations: Abdomen is soft. There is no mass.     Tenderness: There is no abdominal tenderness. There is no right CVA tenderness, left CVA tenderness, guarding or rebound.     Comments: Bowel sounds positive in all 4 quadrants. No tenderness to palpation. Negative Murphy Sign, Rovsing's sign, McBurney point tenderness.   Lymphadenopathy:     Cervical: Cervical adenopathy present.     Right cervical: Superficial cervical adenopathy present.  Neurological:     General: No focal deficit present.  Mental Status: He is alert and oriented to person, place, and time.  Psychiatric:        Attention and Perception: Attention and perception normal.        Mood and Affect: Mood and affect normal.        Behavior: Behavior normal. Behavior is cooperative.        Thought Content: Thought content normal.        Judgment: Judgment normal.      UC Treatments / Results  Labs (all labs ordered are listed, but only abnormal results are displayed) Labs Reviewed - No data to display  EKG   Radiology No results found.  Procedures Procedures (including critical care time)  Medications Ordered in UC Medications - No data to display  Initial Impression / Assessment and Plan / UC Course  I have reviewed the triage vital signs and the nursing notes.  Pertinent labs & imaging results that were available during my care of the patient were reviewed by me and considered in my medical decision making (see chart for details).     This patient is a 70 year old male presenting with 3 weeks of vomiting up blood 1-2 times a day, and having 1-2 episodes of bloody diarrhea daily.  Also endorses congestion, sore throat, and enlarged lymph nodes.  Fairly benign exam, no abdominal tenderness.  I am again recommending to this patient that he head straight to the ER for further evaluation.  He will be wheeled there by nurse.  Rectal exam/hemoccult test, covid test  deferred at this visit, as he will be fully worked up at [facility].   Spent over 40 minutes obtaining H&P, performing physical, discussing results, treatment plan and plan for follow-up with patient. Patient agrees with plan.   This chart was dictated using voice recognition software, Dragon. Despite the best efforts of this provider to proofread and correct errors, errors may still occur which can change documentation meaning.     Final Clinical Impressions(s) / UC Diagnoses   Final diagnoses:  Hematemesis, presence of nausea not specified  Melena  Viral URI with cough     Discharge Instructions     Please head straight to [facility] ED for further evaluation of bloody stool and throwing up blood. It's important that you head straight there and get evaluated for your symptoms.   If you develop chest pain, dizziness, weakness, headaches, abdominal pain, etc while on the way to the ED- stop immediately and call EMS.     ED Prescriptions    None     PDMP not reviewed this encounter.   Hazel Sams, PA-C 03/03/20 1214

## 2020-03-03 NOTE — H&P (Signed)
History and Physical    Jerry Cook DOB: 11-Oct-1950 DOA: 03/03/2020  PCP: Derinda Late, MD (Confirm with patient/family/NH records and if not entered, this has to be entered at [facility] point of entry) Patient coming from: Home  I have personally briefly reviewed patient's old medical records in [place]  Chief Complaint: Coughing up blood, black tarry stool  HPI: Jerry Cook is a 70 y.o. male with medical history significant of HTN, history of polyp and rectal bleed, BPH, COPD, HLD, presented with worsening of coughing up blood and rectal bleed.  Patient had first episode of coughing up blood 3 to 4 weeks ago woke up with a big blood clot in the mouth, and this morning, he woke on Monday felt his mouth is full of blood he has some cough but no more blood coming oout.  Same time, he reported on and off throat pain, no fever chills.  He denied any shortness of breath no chest pains, and he did not notice any voice changes.  He lost about 20 pounds in the last year but gained back 10 pounds recently.  Denies any appetite changes.  He has history of colon polyps and has been having on and off rectal bleed which been getting worse lately, he has been noticed streak of blood covering stool and dark-colored stool since last week.  No abdominal pain.  He reported he has a mass on the right side of the neck developed about 2 years ago and gradually getting bigger.  Denies any associated pain.  Quit smoking 40 years ago, drink 1-2 beers a week.  ED Course: Hemoglobin 12 compared to 14 couple weeks ago at [facility].  Large neck mass noticed, CT neck of soft tissue showed large mass on the base of the tongue and glossotonsillar sulcus extending to the region of right vallecula, suspicious for squamous cell carcinoma.  Review of Systems: As per HPI otherwise 14 point review of systems negative.    Past Medical History:  Diagnosis Date  . Hyperlipidemia   . Hypertension      Past Surgical History:  Procedure Laterality Date  . TONSILLECTOMY       reports that he has quit smoking. He has never used smokeless tobacco. He reports current alcohol use. No history on file for drug use.  No Known Allergies  History reviewed. No pertinent family history.    Prior to Admission medications   Medication Sig Start Date End Date Taking? Authorizing Provider  albuterol (PROVENTIL HFA;VENTOLIN HFA) 108 (90 Base) MCG/ACT inhaler  03/05/18   [provider]  amoxicillin-clavulanate (AUGMENTIN) 875-125 MG tablet Take 1 tablet by mouth 2 (two) times daily. 03/09/18   Leandrew Koyanagi, MD  atorvastatin (LIPITOR) 10 MG tablet  01/23/18   [provider]  clonazePAM Bobbye [place]) 2 MG tablet  01/26/18   [provider]  finasteride (PROSCAR) 5 MG tablet Take 5 mg by mouth daily.    [provider]  Fluticasone-Salmeterol (ADVAIR) 250-50 MCG/DOSE AEPB Inhale 1 puff into the lungs 2 (two) times daily.    [provider]  metoprolol tartrate (LOPRESSOR) 50 MG tablet  03/05/18   [provider]  omeprazole (PRILOSEC) 20 MG capsule  03/05/18   [provider]  PARoxetine (PAXIL) 20 MG tablet Take 20 mg by mouth daily.    [provider]  sildenafil (REVATIO) 20 MG tablet Take 20 mg by mouth 3 (three) times daily.    [provider]  tamsulosin (FLOMAX) 0.4 MG CAPS capsule Take 0.4 mg by mouth.    [provider]  traZODone (DESYREL) 50 MG tablet  03/07/18   [provider]  triamterene-hydrochlorothiazide (DYAZIDE) 37.5-25 MG capsule Take 1 capsule by mouth daily.    [provider]    Physical Exam: Vitals:   03/03/20 1715 03/03/20 1800 03/03/20 1815 03/03/20 1830  BP: (!) 152/61 (!) 142/99 116/70 135/71  Pulse: 61 70 (!) 45 (!) 45  Resp: 12 17 11 16   Temp:      TempSrc:      SpO2: 95% 94% 99% 97%    Constitutional: NAD, calm, comfortable Vitals:   03/03/20 1715 03/03/20  1800 03/03/20 1815 03/03/20 1830  BP: (!) 152/61 (!) 142/99 116/70 135/71  Pulse: 61 70 (!) 45 (!) 45  Resp: 12 17 11 16   Temp:      TempSrc:      SpO2: 95% 94% 99% 97%   Eyes: PERRL, lids and conjunctivae normal ENMT: Mucous membranes are moist. Posterior pharynx clear of any exudate or lesions.Normal dentition.  Neck: Large nonmovable right neck lymph node mild tenderness Respiratory: clear to auscultation bilaterally, no wheezing, no crackles. Normal respiratory effort. No accessory muscle use.  Cardiovascular: Regular rate and rhythm, no murmurs / rubs / gallops. No extremity edema. 2+ pedal pulses. No carotid bruits.  Abdomen: no tenderness, no masses palpated. No hepatosplenomegaly. Bowel sounds positive.  Musculoskeletal: no clubbing / cyanosis. No joint deformity upper and lower extremities. Good ROM, no contractures. Normal muscle tone.  Skin: no rashes, lesions, ulcers. No induration Neurologic: CN 2-12 grossly intact. Sensation intact, DTR normal. Strength 5/5 in all 4.  Psychiatric: Normal judgment and insight. Alert and oriented x 3. Normal mood.    Labs on Admission: I have personally reviewed following labs and imaging studies  CBC: Recent Labs  Lab 03/03/20 1303  WBC 9.0  HGB 12.0*  HCT 37.0*  MCV 91.1  PLT 323   Basic Metabolic Panel: Recent Labs  Lab 03/03/20 1303  NA 142  K 3.5  CL 104  CO2 29  GLUCOSE 110*  BUN 23  CREATININE 1.17  CALCIUM 8.6*   GFR: CrCl cannot be calculated (Unknown ideal weight.). Liver Function Tests: Recent Labs  Lab 03/03/20 1303  AST 27  ALT 32  ALKPHOS 59  BILITOT 0.5  PROT 6.0*  ALBUMIN 3.3*   No results for input(s): LIPASE, AMYLASE in the last 168 hours. No results for input(s): AMMONIA in the last 168 hours. Coagulation Profile: Recent Labs  Lab 03/03/20 1513  INR 1.2   Cardiac Enzymes: No results for input(s): CKTOTAL, CKMB, CKMBINDEX, TROPONINI in the last 168 hours. BNP (last 3 results) No  results for input(s): PROBNP in the last 8760 hours. HbA1C: No results for input(s): HGBA1C in the last 72 hours. CBG: No results for input(s): GLUCAP in the last 168 hours. Lipid Profile: No results for input(s): CHOL, HDL, LDLCALC, TRIG, CHOLHDL, LDLDIRECT in the last 72 hours. Thyroid Function Tests: No results for input(s): TSH, T4TOTAL, FREET4, T3FREE, THYROIDAB in the last 72 hours. Anemia Panel: No results for input(s): VITAMINB12, FOLATE, FERRITIN, TIBC, IRON, RETICCTPCT in the last 72 hours. Urine analysis: No results found for: COLORURINE, APPEARANCEUR, LABSPEC, [place], GLUCOSEU, HGBUR, BILIRUBINUR, KETONESUR, PROTEINUR, UROBILINOGEN, NITRITE, LEUKOCYTESUR  Radiological Exams on Admission: DG Chest 2 View  Result Date: 03/03/2020 CLINICAL DATA:  Hemoptysis. EXAM: CHEST - 2 VIEW COMPARISON:  01/14/2013. FINDINGS: Mediastinum and hilar structures normal. Heart  size normal. No focal infiltrate. No pleural effusion or pneumothorax. Degenerative change thoracic spine. IMPRESSION: No acute cardiopulmonary disease. Electronically Signed   By: Marcello Moores  Register   On: 03/03/2020 15:14   CT Soft Tissue Neck W Contrast  Result Date: 03/03/2020 CLINICAL DATA:  Lymphadenopathy, neck. Enlarged lymph nodes, shortness of breath, hemoptysis. EXAM: CT NECK WITH CONTRAST TECHNIQUE: Multidetector CT imaging of the neck was performed using the standard protocol following the bolus administration of intravenous contrast. CONTRAST:  22mL OMNIPAQUE IOHEXOL 300 MG/ML  SOLN COMPARISON:  CT of the paranasal sinuses 04/26/2006. FINDINGS: Pharynx and larynx: Prominent streak and beam hardening artifact arising from dental restoration obscures portions of the oral cavity and pharynx. Asymmetric soft tissue within the right base of tongue and glossotonsillar sulcus regions, extending to the right vallecula and measuring 2.5 x 1.9 x 3.0 cm, likely reflecting a base of tongue squamous cell carcinoma (for instance as  seen on series 3, image 31). Salivary glands: There is an ill-defined fat pain between right level 2 cystic/necrotic lymphadenopathy and portions of the right parotid and submandibular glands. The parotid and submandibular glands are otherwise unremarkable. Thyroid: Unremarkable. Lymph nodes: Extensive cystic/necrotic cervical lymphadenopathy at the right level 1, 2, 3 and 4 stations and at the left level 2 station. Cystic/necrotic right level 1 lymph nodes measure up to 2.3 cm in short axis (series 3, image 41). Multiple enlarged cystic/necrotic right level 2 lymph nodes, including a dominant right level 2 nodal conglomerate measuring 4.9 x 4.6 cm in transaxial dimensions (series 3, image 29). This nodal conglomerate has ill-defined margins with the adjacent right sternocleidomastoid muscle, right parotid gland and right submandibular gland and findings are highly suspicious for extracapsular extension. The largest right level 3 lymph node is cystic/necrotic and measures 1.4 cm in short axis (series 3, image 58). An enlarged cystic/necrotic right level 3/4 lymph node measures 1.7 cm in short axis (series 3, image 66). Multiple cystic/necrotic left level 2 lymph nodes measuring up to 2.3 cm in short axis (series 3, images 29 and 36). An enlarged cystic/necrotic left level 2 lymph node has an ill-defined margin with the adjacent left sternocleidomastoid muscle and findings are suspicious for extracapsular extension. Vascular: Cystic/necrotic lymphadenopathy near completely effaces the upper right internal jugular vein. Cystic/necrotic lymphadenopathy also significantly narrows the upper left internal jugular vein. Limited intracranial: No acute intracranial abnormality identified at the imaged levels. Visualized orbits: Excluded from the field of view. Mastoids and visualized paranasal sinuses: Incompletely imaged sequela of prior maxillofacial reconstructive surgery. Moderate mucosal thickening and possible fluid  within the imaged right maxillary sinus. Mild mucosal thickening within the imaged left maxillary sinus. No significant mastoid effusion. Skeleton: No acute bony abnormality or aggressive osseous lesion. C4-C5 grade 1 anterolisthesis. Cervical spondylosis. Most notably, there is advanced disc space narrowing and degenerative endplate sclerosis with a disc bulge, endplate spurring and uncovertebral hypertrophy at C5-C6. Bilateral bony neural foraminal narrowing and apparent mild/moderate spinal canal stenosis at this level. Upper chest: No consolidation within the imaged lung apices. These results were called by telephone at the time of interpretation on 03/03/2020 at 4:54 pm to provider Dr. Tomi Bamberger, who verbally acknowledged these results. IMPRESSION: 2.5 x 1.9 x 3.0 cm right base of tongue and glossotonsillar sulcus mass extending to the region of the right vallecula, highly suspicious for a squamous cell carcinoma primary tumor. ENT consultation and direct visualization recommended. Extensive bilateral cystic/necrotic nodal metastatic disease at the right level 1, 2, 3 and 4 stations and  left level 2 station. Several lymph nodes demonstrate ill-defined margins highly suspicious for extracapsular extension. Lymphadenopathy significantly narrows the upper internal jugular veins bilaterally. Paranasal sinus disease as described. Electronically Signed   By: Kellie Simmering DO   On: 03/03/2020 16:55    EKG: Independently reviewed.  Sinus bradycardia  Assessment/Plan Active Problems:   GI bleed  (please populate well all problems here in Problem List. (For example, if patient is on BP meds at home and you resume or decide to hold them, it is a problem that needs to be her. Same for CAD, COPD, HLD and so on)  Hemoptysis -Likely from the surface bleeding from the tonsillar mass -ENT consulted, plan for laryngoscopy tomorrow  Lower GI bleed -Suspect recurrent polyp bleed? 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 after midnight  Sinus bradycardia -Asymptomatic, cut down beta-blocker dosage  HTN -As above  COPD -No symptoms signs of acute exacerbation, continue home meds.  BPH -Home meds  DVT prophylaxis: SCD Code Status: Full Code Family Communication: None at bedside Disposition Plan: Expect 1 to 2 days hospital stay for pulmonary scope and EGD and possible colonoscopy. Consults called: [facility] Admission status: Telemetry admission   Lequita Halt MD [facility] Pager [phone]  03/03/2020, 7:20 PM

## 2020-03-03 NOTE — ED Notes (Signed)
Pt was instructed to go to the ED for further evaluation per provider. PT states that he is going to ED now.

## 2020-03-03 NOTE — ED Provider Notes (Signed)
[facility] EMERGENCY DEPARTMENT Provider Note   CSN: [id number] Arrival date & time: 03/03/20  1230     History Chief Complaint  Patient presents with  . Rectal Bleeding  . Emesis    Jerry Cook is a 70 y.o. male.  Patient with history of hyperlipidemia, hypertension, history of GERD and Niesen fundoplication, continues to take PPI daily -- presents to the emergency department via urgent care for concern of hemoptysis, vomiting blood, and bloody stools.  Patient has noted enlarged lymph nodes in his neck for several weeks.  No infectious symptoms at onset.  He states that about a week or 2 ago he produced bloody phlegm that he spit out.  This morning he awoke with blood in his mouth that he spit out.  This is what prompted urgent care visit after he spoke with PCP.  He has noted brown to black stools ongoing over the past several weeks.  He reports that his last checkup was a month ago and had about a 20 pound weight loss but thought this was due to decreasing portion size.  He denies any sick contacts.  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  He has been immunized against Kiln.  He has been seen by Dr. Cristina Gong of GI in the past.  He has had colonoscopies which per his report were unremarkable.  He has also had EGD, last several years ago.  He feels that his voice is "raspy" compared to normal.  He denies chest pain or abdominal pain.  No urinary symptoms.        Past Medical History:  Diagnosis Date  . Hyperlipidemia   . Hypertension     Patient Active Problem List   Diagnosis Date Noted  . Nondisplaced fracture of distal phalanx of right great toe, initial encounter for closed fracture 05/08/2018    Past Surgical History:  Procedure Laterality Date  . TONSILLECTOMY         History reviewed. No pertinent family history.  Social History   Tobacco Use  . Smoking status: Former Research scientist (life sciences)  . Smokeless tobacco: Never Used  . Tobacco comment: Quit in 1981   Substance Use Topics  . Alcohol use: Yes    Home Medications Prior to Admission medications   Medication Sig Start Date End Date Taking? Authorizing Provider  albuterol (PROVENTIL HFA;VENTOLIN HFA) 108 (90 Base) MCG/ACT inhaler  03/05/18   [provider]  amoxicillin-clavulanate (AUGMENTIN) 875-125 MG tablet Take 1 tablet by mouth 2 (two) times daily. 03/09/18   Leandrew Koyanagi, MD  atorvastatin (LIPITOR) 10 MG tablet  01/23/18   [provider]  clonazePAM Bobbye Charleston) 2 MG tablet  01/26/18   [provider]  finasteride (PROSCAR) 5 MG tablet Take 5 mg by mouth daily.    [provider]  Fluticasone-Salmeterol (ADVAIR) 250-50 MCG/DOSE AEPB Inhale 1 puff into the lungs 2 (two) times daily.    [provider]  metoprolol tartrate (LOPRESSOR) 50 MG tablet  03/05/18   [provider]  omeprazole (PRILOSEC) 20 MG capsule  03/05/18   [provider]  PARoxetine (PAXIL) 20 MG tablet Take 20 mg by mouth daily.    [provider]  sildenafil (REVATIO) 20 MG tablet Take 20 mg by mouth 3 (three) times daily.    [provider]  tamsulosin (FLOMAX) 0.4 MG CAPS capsule Take 0.4 mg by mouth.    [provider]  traZODone (DESYREL) 50 MG tablet  03/07/18   [provider]  triamterene-hydrochlorothiazide (DYAZIDE) 37.5-25 MG capsule Take 1 capsule by mouth daily.    [provider]    Allergies    Patient has no known allergies.  Review of Systems   Review of Systems  Constitutional: Positive for unexpected weight change. Negative for fever.  HENT: Positive for sore throat. Negative for rhinorrhea.   Eyes: Negative for redness.  Respiratory: Positive for cough. Negative for shortness of breath.   Cardiovascular: Negative for chest pain.  Gastrointestinal: Positive for blood in stool. Negative for abdominal pain, diarrhea, nausea and vomiting.  Genitourinary: Negative for dysuria and hematuria.   Musculoskeletal: Negative for myalgias.  Skin: Negative for rash.  Neurological: Negative for headaches.  Hematological: Positive for adenopathy.    Physical Exam Updated Vital Signs BP (!) 120/58 (BP Location: Left Arm)   Pulse (!) 47   Temp (!) 97.5 F (36.4 C) (Oral)   Resp 16   SpO2 98%   Physical Exam Vitals and nursing note reviewed. Exam conducted with a chaperone present.  Constitutional:      Appearance: He is well-developed and well-nourished.  HENT:     Head: Normocephalic and atraumatic.     Mouth/Throat:     Pharynx: Oropharynx is clear. No oropharyngeal exudate or posterior oropharyngeal erythema.  Eyes:     General:        Right eye: No discharge.        Left eye: No discharge.     Conjunctiva/sclera: Conjunctivae normal.  Cardiovascular:     Rate and Rhythm: Regular rhythm. Bradycardia present. Occasional extrasystoles are present.    Heart sounds: Normal heart sounds.  Pulmonary:     Effort: Pulmonary effort is normal.     Breath sounds: Normal breath sounds.  Chest:  Breasts:     Right: No supraclavicular adenopathy.     Left: No supraclavicular adenopathy.    Abdominal:     Palpations: Abdomen is soft.     Tenderness: There is no abdominal tenderness.  Genitourinary:    Rectum: Guaiac result positive. External hemorrhoid present. No tenderness.     Comments: Slight red blood noted on finger, no large volume bleeding Musculoskeletal:     Cervical back: Normal range of motion and neck supple.  Lymphadenopathy:     Upper Body:     Right upper body: No supraclavicular adenopathy.     Left upper body: No supraclavicular adenopathy.     Comments: Patient has bulky cervical lymphadenopathy bilaterally.  Lymph nodes are hard, enlarged, not mobile.  No supraclavicular nodes palpated.   Skin:    General: Skin is warm and dry.  Neurological:     Mental Status: He is alert.  Psychiatric:        Mood and Affect: Mood and affect normal.     ED  Results / Procedures / Treatments   Labs (all labs ordered are listed, but only abnormal results are displayed) Labs Reviewed  COMPREHENSIVE METABOLIC PANEL - Abnormal; Notable for the following components:      Result Value   Glucose, Bld 110 (*)    Calcium 8.6 (*)    Total Protein 6.0 (*)    Albumin 3.3 (*)    All other components within normal limits  CBC - Abnormal; Notable for the following components:   RBC 4.06 (*)    Hemoglobin 12.0 (*)    HCT 37.0 (*)    All other components within normal limits  POC OCCULT BLOOD, ED - Abnormal; Notable  for the following components:   Fecal Occult Bld POSITIVE (*)    All other components within normal limits  SARS CORONAVIRUS 2 (TAT 6-24 HRS)  PROTIME-INR  TYPE AND SCREEN  ABO/RH    EKG None  Radiology DG Chest 2 View  Result Date: 03/03/2020 CLINICAL DATA:  Hemoptysis. EXAM: CHEST - 2 VIEW COMPARISON:  01/14/2013. FINDINGS: Mediastinum and hilar structures normal. Heart size normal. No focal infiltrate. No pleural effusion or pneumothorax. Degenerative change thoracic spine. IMPRESSION: No acute cardiopulmonary disease. Electronically Signed   By: Marcello Moores  Register   On: 03/03/2020 15:14    Procedures Procedures   Medications Ordered in ED Medications  pantoprazole (PROTONIX) injection 40 mg (has no administration in time range)    ED Course  I have reviewed the triage vital signs and the nursing notes.  Pertinent labs & imaging results that were available during my care of the patient were reviewed by me and considered in my medical decision making (see chart for details).  Patient seen and examined. Work-up initiated.   Vital signs reviewed and are as follows: BP (!) 120/58 (BP Location: Left Arm)   Pulse (!) 47   Temp (!) 97.5 F (36.4 C) (Oral)   Resp 16   SpO2 98%   Rectal exam performed with RN chaperone.  There is a small amount of reddish stool noted.  No large volume bleed.  No masses.  Chest x-ray  reviewed.  3:25 PM patient will need admission for GI bleed.  CT of the neck ordered to further evaluate lymphadenopathy.  Will need to evaluate for reactive lymph nodes versus other cause.  Signout to [employer] at shift change.     MDM Rules/Calculators/A&P                          Awaiting completion of work-up.   Final Clinical Impression(s) / ED Diagnoses Final diagnoses:  None    Rx / DC Orders ED Discharge Orders    None       Carlisle Cater, PA-C 03/03/20 1526    Veryl Speak, MD 03/04/20 2337

## 2020-03-04 DIAGNOSIS — N400 Enlarged prostate without lower urinary tract symptoms: Secondary | ICD-10-CM

## 2020-03-04 DIAGNOSIS — I10 Essential (primary) hypertension: Secondary | ICD-10-CM

## 2020-03-04 DIAGNOSIS — K922 Gastrointestinal hemorrhage, unspecified: Secondary | ICD-10-CM | POA: Diagnosis present

## 2020-03-04 DIAGNOSIS — C770 Secondary and unspecified malignant neoplasm of lymph nodes of head, face and neck: Secondary | ICD-10-CM | POA: Diagnosis not present

## 2020-03-04 DIAGNOSIS — D62 Acute posthemorrhagic anemia: Secondary | ICD-10-CM | POA: Diagnosis not present

## 2020-03-04 DIAGNOSIS — K921 Melena: Secondary | ICD-10-CM | POA: Diagnosis not present

## 2020-03-04 DIAGNOSIS — R042 Hemoptysis: Secondary | ICD-10-CM | POA: Diagnosis not present

## 2020-03-04 DIAGNOSIS — C01 Malignant neoplasm of base of tongue: Secondary | ICD-10-CM | POA: Diagnosis not present

## 2020-03-04 LAB — BASIC METABOLIC PANEL
Anion gap: 9 (ref 5–15)
BUN: 15 mg/dL (ref 8–23)
CO2: 28 mmol/L (ref 22–32)
Calcium: 8.9 mg/dL (ref 8.9–10.3)
Chloride: 104 mmol/L (ref 98–111)
Creatinine, Ser: 1.05 mg/dL (ref 0.61–1.24)
GFR, Estimated: >60 mL/min (ref >60)
Glucose, Bld: 93 mg/dL (ref 70–99)
Potassium: 3.1 mmol/L — ABNORMAL LOW (ref 3.5–5.1)
Sodium: 141 mmol/L (ref 135–145)

## 2020-03-04 LAB — CBC
HCT: 33.5 % — ABNORMAL LOW (ref 39.0–52.0)
Hemoglobin: 11.6 g/dL — ABNORMAL LOW (ref 13.0–17.0)
MCH: 30.6 pg (ref 26.0–34.0)
MCHC: 34.6 g/dL (ref 30.0–36.0)
MCV: 88.4 fL (ref 80.0–100.0)
Platelets: 149 10*3/uL — ABNORMAL LOW (ref 150–400)
RBC: 3.79 MIL/uL — ABNORMAL LOW (ref 4.22–5.81)
RDW: 12.5 % (ref 11.5–15.5)
WBC: 6.9 10*3/uL (ref 4.0–10.5)
nRBC: 0 % (ref 0.0–0.2)

## 2020-03-04 LAB — IRON AND TIBC
Iron: 43 ug/dL — ABNORMAL LOW (ref 45–182)
Saturation Ratios: 18 % (ref 17.9–39.5)
TIBC: 235 ug/dL — ABNORMAL LOW (ref 250–450)
UIBC: 192 ug/dL

## 2020-03-04 LAB — ABO/RH: ABO/RH(D): O POS

## 2020-03-04 LAB — FERRITIN: Ferritin: 143 ng/mL (ref 24–336)

## 2020-03-04 MED ORDER — GERHARDT'S BUTT CREAM
TOPICAL_CREAM | Freq: Two times a day (BID) | CUTANEOUS | Status: DC | PRN
  Filled 2020-03-04: qty 1

## 2020-03-04 MED ORDER — LOPERAMIDE HCL 2 MG PO CAPS: 2.0000 mg | ORAL_CAPSULE | Freq: Four times a day (QID) | ORAL | Status: DC | PRN

## 2020-03-04 MED ORDER — HYDROCORTISONE (PERIANAL) 2.5 % EX CREA
TOPICAL_CREAM | Freq: Two times a day (BID) | CUTANEOUS | Status: DC
  Filled 2020-03-04: qty 28.35

## 2020-03-04 MED ORDER — POTASSIUM CHLORIDE CRYS ER 20 MEQ PO TBCR
40.0000 meq | EXTENDED_RELEASE_TABLET | Freq: Once | ORAL | Status: AC
  Administered 2020-03-04: 40 meq via ORAL
  Filled 2020-03-04: qty 2

## 2020-03-04 NOTE — Consult Note (Signed)
Referring Provider: Wyn Quaker, PA-C (ED) Primary Care Physician: Shirline Frees [facility]) Primary Gastroenterologist: Dr. Cristina Gong [facility])  Reason for Consultation:  Hemoptysis, melena in the setting of tongue mass  HPI: Jerry Cook is a 70 y.o. male with history of HTN and COPD presenting for consultation of hemoptysis and melena in the setting of tongue mass.  Patient states that he has been having intermittent blood in his mouth over the past 1 month.  1 morning, he woke up with blood in his mouth.  Another time, he coughed up some bloody sputum.  He's noted vocal hoarseness over the last 2-3 weeks.  He denies any nausea, vomiting, or hematemesis.  Denies dysphagia.  Has noted weight loss of approximately 10 to 20 pounds over the past year.  Denies changes in appetite.  Denies abdominal pain.  Notes chronic diarrhea, though states he has not had work-up for this.  Typically has 1-3 loose stools per day, typically after eating.  Reports a couple episodes of melena, though is unable to report whether or not these happened after episodes of hemoptysis.  States he occasionally has a scant amount of bright red blood per rectum on the tissue after wiping a lot due to diarrhea.  Per ED report, patient had external hemorrhoids and some scant bright red blood per rectum on exam.  He denies any family history of colon cancer or other gastrointestinal malignancy.  Denies, or blood thinner use.  He has never had an EGD but has had prior colonoscopies. Colonoscopy 02/2017: diverticulosis, otherwise normal Coloscopy 06/2011: Two 14mm polyps (biopsy revealed one to be a serrated adenoma)  Past Medical History:  Diagnosis Date  . Hyperlipidemia   . Hypertension     Past Surgical History:  Procedure Laterality Date  . TONSILLECTOMY      Prior to Admission medications   Medication Sig Start Date End Date Taking? Authorizing Provider  atorvastatin (LIPITOR) 10 MG tablet Take 10 mg by mouth at  bedtime. 01/23/18  Yes [provider]  cetirizine (ZYRTEC) 10 MG tablet Take 10 mg by mouth daily.   Yes [provider]  clonazePAM (KLONOPIN) 2 MG tablet Take 2 mg by mouth at bedtime. 01/26/18  Yes [provider]  finasteride (PROSCAR) 5 MG tablet Take 5 mg by mouth daily.   Yes [provider]  fluticasone (FLONASE) 50 MCG/ACT nasal spray Place 2 sprays into both nostrils as needed for allergies.   Yes [provider]  metoprolol tartrate (LOPRESSOR) 50 MG tablet Take 50 mg by mouth 2 (two) times daily. 03/05/18  Yes [provider]  omeprazole (PRILOSEC) 20 MG capsule Take 20 mg by mouth daily. 03/05/18  Yes [provider]  PARoxetine (PAXIL) 20 MG tablet Take 20 mg by mouth daily.   Yes [provider]  sildenafil (REVATIO) 20 MG tablet Take 100 mg by mouth as needed (erectile dysfunction).   Yes [provider]  tamsulosin (FLOMAX) 0.4 MG CAPS capsule Take 0.4 mg by mouth at bedtime.   Yes [provider]  traZODone (DESYREL) 100 MG tablet Take 100 mg by mouth at bedtime. 03/07/18  Yes [provider]  triamterene-hydrochlorothiazide (DYAZIDE) 37.5-25 MG capsule Take 1 capsule by mouth daily.   Yes [provider]    Scheduled Meds: . atorvastatin  10 mg Oral Daily  . finasteride  5 mg Oral Daily  . metoprolol tartrate  12.5 mg Oral BID  . pantoprazole  40 mg Oral Daily  .  PARoxetine  20 mg Oral Daily  . potassium chloride  40 mEq Oral Once  . tamsulosin  0.4 mg Oral QPC supper  . traZODone  50 mg Oral QHS   Continuous Infusions: PRN Meds:.acetaminophen **OR** acetaminophen  Allergies as of 03/03/2020  . (No Known Allergies)    History reviewed. No pertinent family history.  Social History   Socioeconomic History  . Marital status: Single    Spouse name: Not on file  . Number of children: Not on file  . Years of education: Not on file  . Highest education level: Not  on file  Occupational History  . Not on file  Tobacco Use  . Smoking status: Former Research scientist (life sciences)  . Smokeless tobacco: Never Used  . Tobacco comment: Quit in 1981  Substance and Sexual Activity  . Alcohol use: Yes  . Drug use: Not on file  . Sexual activity: Not on file  Other Topics Concern  . Not on file  Social History Narrative  . Not on file   Social Determinants of Health   Financial Resource Strain: Not on file  Food Insecurity: Not on file  Transportation Needs: Not on file  Physical Activity: Not on file  Stress: Not on file  Social Connections: Not on file  Intimate Partner Violence: Not on file    Review of Systems: Review of Systems  Constitutional: Positive for weight loss. Negative for chills and fever.  HENT: Negative for hearing loss and tinnitus.   Eyes: Negative for pain and redness.  Respiratory: Negative for cough and shortness of breath.   Cardiovascular: Negative for chest pain and palpitations.  Gastrointestinal: Positive for blood in stool, diarrhea and melena. Negative for abdominal pain, constipation, heartburn, nausea and vomiting.  Genitourinary: Negative for flank pain and hematuria.  Musculoskeletal: Negative for falls and joint pain.  Skin: Negative for itching and rash.  Neurological: Negative for seizures and loss of consciousness.  Endo/Heme/Allergies: Negative for polydipsia. Does not bruise/bleed easily.  Psychiatric/Behavioral: Negative for substance abuse. The patient is not nervous/anxious.      Physical Exam: Vital signs: Vitals:   03/04/20 0542 03/04/20 0704  BP: (!) 142/77 (!) 144/73  Pulse: (!) 56 75  Resp: 19 17  Temp:    SpO2: 94% 97%      Physical Exam Vitals reviewed.  Constitutional:      General: He is not in acute distress. HENT:     Head: Normocephalic and atraumatic.     Nose: Nose normal. No congestion.     Mouth/Throat:     Mouth: Mucous membranes are moist.     Pharynx: Oropharynx is clear.     Comments:  Vocal hoarseness Cardiovascular:     Rate and Rhythm: Normal rate and regular rhythm.     Pulses: Normal pulses.  Pulmonary:     Effort: Pulmonary effort is normal. No respiratory distress.     Breath sounds: Normal breath sounds.  Abdominal:     General: Bowel sounds are normal. There is no distension.     Palpations: Abdomen is soft. There is no mass.     Tenderness: There is no abdominal tenderness. There is no guarding or rebound.     Hernia: No hernia is present.  Musculoskeletal:        General: No swelling.     Cervical back: Normal range of motion and neck supple.  Skin:    General: Skin is warm and dry.  Neurological:     General: No  focal deficit present.     Mental Status: He is alert and oriented to person, place, and time.  Psychiatric:        Mood and Affect: Mood normal.        Behavior: Behavior normal. Behavior is cooperative.      GI:  Lab Results: Recent Labs    03/03/20 1303 03/04/20 0535  WBC 9.0 6.9  HGB 12.0* 11.6*  HCT 37.0* 33.5*  PLT 155 149*   BMET Recent Labs    03/03/20 1303 03/04/20 0535  NA 142 141  K 3.5 3.1*  CL 104 104  CO2 29 28  GLUCOSE 110* 93  BUN 23 15  CREATININE 1.17 1.05  CALCIUM 8.6* 8.9   LFT Recent Labs    03/03/20 1303  PROT 6.0*  ALBUMIN 3.3*  AST 27  ALT 32  ALKPHOS 59  BILITOT 0.5   PT/INR Recent Labs    03/03/20 1513  LABPROT 14.4  INR 1.2     Studies/Results: DG Chest 2 View  Result Date: 03/03/2020 CLINICAL DATA:  Hemoptysis. EXAM: CHEST - 2 VIEW COMPARISON:  01/14/2013. FINDINGS: Mediastinum and hilar structures normal. Heart size normal. No focal infiltrate. No pleural effusion or pneumothorax. Degenerative change thoracic spine. IMPRESSION: No acute cardiopulmonary disease. Electronically Signed   By: Marcello Moores  Register   On: 03/03/2020 15:14   CT Soft Tissue Neck W Contrast  Result Date: 03/03/2020 CLINICAL DATA:  Lymphadenopathy, neck. Enlarged lymph nodes, shortness of breath,  hemoptysis. EXAM: CT NECK WITH CONTRAST TECHNIQUE: Multidetector CT imaging of the neck was performed using the standard protocol following the bolus administration of intravenous contrast. CONTRAST:  13mL OMNIPAQUE IOHEXOL 300 MG/ML  SOLN COMPARISON:  CT of the paranasal sinuses 04/26/2006. FINDINGS: Pharynx and larynx: Prominent streak and beam hardening artifact arising from dental restoration obscures portions of the oral cavity and pharynx. Asymmetric soft tissue within the right base of tongue and glossotonsillar sulcus regions, extending to the right vallecula and measuring 2.5 x 1.9 x 3.0 cm, likely reflecting a base of tongue squamous cell carcinoma (for instance as seen on series 3, image 31). Salivary glands: There is an ill-defined fat pain between right level 2 cystic/necrotic lymphadenopathy and portions of the right parotid and submandibular glands. The parotid and submandibular glands are otherwise unremarkable. Thyroid: Unremarkable. Lymph nodes: Extensive cystic/necrotic cervical lymphadenopathy at the right level 1, 2, 3 and 4 stations and at the left level 2 station. Cystic/necrotic right level 1 lymph nodes measure up to 2.3 cm in short axis (series 3, image 41). Multiple enlarged cystic/necrotic right level 2 lymph nodes, including a dominant right level 2 nodal conglomerate measuring 4.9 x 4.6 cm in transaxial dimensions (series 3, image 29). This nodal conglomerate has ill-defined margins with the adjacent right sternocleidomastoid muscle, right parotid gland and right submandibular gland and findings are highly suspicious for extracapsular extension. The largest right level 3 lymph node is cystic/necrotic and measures 1.4 cm in short axis (series 3, image 58). An enlarged cystic/necrotic right level 3/4 lymph node measures 1.7 cm in short axis (series 3, image 66). Multiple cystic/necrotic left level 2 lymph nodes measuring up to 2.3 cm in short axis (series 3, images 29 and 36). An enlarged  cystic/necrotic left level 2 lymph node has an ill-defined margin with the adjacent left sternocleidomastoid muscle and findings are suspicious for extracapsular extension. Vascular: Cystic/necrotic lymphadenopathy near completely effaces the upper right internal jugular vein. Cystic/necrotic lymphadenopathy also significantly narrows the upper left internal  jugular vein. Limited intracranial: No acute intracranial abnormality identified at the imaged levels. Visualized orbits: Excluded from the field of view. Mastoids and visualized paranasal sinuses: Incompletely imaged sequela of prior maxillofacial reconstructive surgery. Moderate mucosal thickening and possible fluid within the imaged right maxillary sinus. Mild mucosal thickening within the imaged left maxillary sinus. No significant mastoid effusion. Skeleton: No acute bony abnormality or aggressive osseous lesion. C4-C5 grade 1 anterolisthesis. Cervical spondylosis. Most notably, there is advanced disc space narrowing and degenerative endplate sclerosis with a disc bulge, endplate spurring and uncovertebral hypertrophy at C5-C6. Bilateral bony neural foraminal narrowing and apparent mild/moderate spinal canal stenosis at this level. Upper chest: No consolidation within the imaged lung apices. These results were called by telephone at the time of interpretation on 03/03/2020 at 4:54 pm to provider Dr. Tomi Bamberger, who verbally acknowledged these results. IMPRESSION: 2.5 x 1.9 x 3.0 cm right base of tongue and glossotonsillar sulcus mass extending to the region of the right vallecula, highly suspicious for a squamous cell carcinoma primary tumor. ENT consultation and direct visualization recommended. Extensive bilateral cystic/necrotic nodal metastatic disease at the right level 1, 2, 3 and 4 stations and left level 2 station. Several lymph nodes demonstrate ill-defined margins highly suspicious for extracapsular extension. Lymphadenopathy significantly narrows the  upper internal jugular veins bilaterally. Paranasal sinus disease as described. Electronically Signed   By: Kellie Simmering DO   On: 03/03/2020 16:55    Impression: Hemoptysis and melena in the setting of tongue mass.  Suspect melena is from swallowed blood and less likely originating from the GI tract.  Drop in hemoglobin likely related to (concerning for malignancy). -Hgb 11.6, iron and ferritin pending -Normal BUN (15)/ Cr 1.05 -Normal INR: 1.2  Scant bright red blood per rectum, most likely related to external hemorrhoids and wiping due to diarrhea.  Can use topical hydrocortisone x 2 weeks, as well as topical cream like zinc oxide to prevent irritation.  Chronic diarrhea  Plan: Would favor a work-up for right ENT/oncology.  If ENT and or oncology would like further GI work-up, we can complete that after work-up of tongue mass.  Recommend outpatient follow-up for chronic diarrhea and scant BRBPR (which is likely hemorrhoidal).  Imodium as needed for diarrhea.  [facility] will sign off.  Please contact us if we can be of any further assistance during his hospital stay.   LOS: 1 day   Salley Slaughter  PA-C 03/04/2020, 8:50 AM  Contact #  [phone]

## 2020-03-04 NOTE — TOC Transition Note (Signed)
Transition of Care [facility]) - CM/SW Discharge Note   Patient Details  Name: Jerry Cook MRN: [id number] Date of Birth: Mar 05, 1950  Transition of Care [facility]) CM/SW Contact:  Pollie Friar, RN Phone Number: 03/04/2020, 11:55 AM   Clinical Narrative:    Pt discharging home with self care. No needs per TOC.    Final next level of care: Home/Self Care Barriers to Discharge: No Barriers Identified   Patient Goals and CMS Choice        Discharge Placement                       Discharge Plan and Services                                     Social Determinants of Health (SDOH) Interventions     Readmission Risk Interventions No flowsheet data found.

## 2020-03-04 NOTE — Consult Note (Signed)
Reason for Consult: Tongue base mass Referring Physician: Hospitalist  Jerry Cook is an 70 y.o. male.  HPI: 70 year old male admitted yesterday due to complaint of coughing up blood and rectal bleeding.  He had an episode of blood in the throat 3-4 weeks ago and then again a few days ago.  He has had a mass in the right side of the neck that has gradually grown, he says, over the past few weeks.  He has had some intermittent throat pain.  He has had some weight loss.  He denies shortness of breath, difficulty swallowing, or change to voice.  He quit smoking 40 years ago and drinks 1-2 beers per week.  He has had some rectal bright red bleeding also.  Past Medical History:  Diagnosis Date  . Hyperlipidemia   . Hypertension     Past Surgical History:  Procedure Laterality Date  . TONSILLECTOMY      History reviewed. No pertinent family history.  Social History:  reports that he has quit smoking. He has never used smokeless tobacco. He reports current alcohol use. No history on file for drug use.  Allergies: No Known Allergies  Medications: I have reviewed the patient's current medications.  Results for orders placed or performed during the hospital encounter of 03/03/20 (from the past 48 hour(s))  Type and screen [place]     Status: None   Collection Time: 03/03/20  1:00 PM  Result Value Ref Range   ABO/RH(D) O POS    Antibody Screen NEG    Sample Expiration      03/06/2020,2359 Performed at [facility], [place] [address] [place], [place] [address]   Comprehensive metabolic panel     Status: Abnormal   Collection Time: 03/03/20  1:03 PM  Result Value Ref Range   Sodium 142 135 - 145 mmol/L   Potassium 3.5 3.5 - 5.1 mmol/L   Chloride 104 98 - 111 mmol/L   CO2 29 22 - 32 mmol/L   Glucose, Bld 110 (H) 70 - 99 mg/dL    Comment: Glucose reference range applies only to samples taken after fasting for at least 8 hours.   BUN 23 8 - 23 mg/dL    Creatinine, Ser 1.17 0.61 - 1.24 mg/dL   Calcium 8.6 (L) 8.9 - 10.3 mg/dL   Total Protein 6.0 (L) 6.5 - 8.1 g/dL   Albumin 3.3 (L) 3.5 - 5.0 g/dL   AST 27 15 - 41 U/L   ALT 32 0 - 44 U/L   Alkaline Phosphatase 59 38 - 126 U/L   Total Bilirubin 0.5 0.3 - 1.2 mg/dL   GFR, Estimated >60 >60 mL/min    Comment: (NOTE) Calculated using the CKD-EPI Creatinine Equation (2021)    Anion gap 9 5 - 15    Comment: Performed at [facility], [place] [address] [place], [place] [address]  CBC     Status: Abnormal   Collection Time: 03/03/20  1:03 PM  Result Value Ref Range   WBC 9.0 4.0 - 10.5 K/uL   RBC 4.06 (L) 4.22 - 5.81 MIL/uL   Hemoglobin 12.0 (L) 13.0 - 17.0 g/dL   HCT 37.0 (L) 39.0 - 52.0 %   MCV 91.1 80.0 - 100.0 fL   MCH 29.6 26.0 - 34.0 pg   MCHC 32.4 30.0 - 36.0 g/dL   RDW 12.5 11.5 - 15.5 %   Platelets 155 150 - 400 K/uL   nRBC  0.0 0.0 - 0.2 %    Comment: Performed at [facility], [place] [address] [place], [place] [address]  SARS CORONAVIRUS 2 (TAT 6-24 HRS) Nasopharyngeal Nasopharyngeal Swab     Status: None   Collection Time: 03/03/20  2:29 PM   Specimen: Nasopharyngeal Swab  Result Value Ref Range   SARS Coronavirus 2 NEGATIVE NEGATIVE    Comment: (NOTE) SARS-CoV-2 target nucleic acids are NOT DETECTED.  The SARS-CoV-2 RNA is generally detectable in upper and lower respiratory specimens during the acute phase of infection. Negative results do not preclude SARS-CoV-2 infection, do not rule out co-infections with other pathogens, and should not be used as the sole basis for treatment or other patient management decisions. Negative results must be combined with clinical observations, patient history, and epidemiological information. The expected result is Negative.  Fact Sheet for Patients: SugarRoll.be  Fact Sheet for Healthcare Providers: https://www.woods-mathews.com/  This test is not yet approved or cleared by the  [place] FDA and  has been authorized for detection and/or diagnosis of SARS-CoV-2 by FDA under an Emergency Use Authorization (EUA). This EUA will remain  in effect (meaning this test can be used) for the duration of the COVID-19 declaration under Se ction 564(b)(1) of the Act, 21 U.S.C. section 360bbb-3(b)(1), unless the authorization is terminated or revoked sooner.  Performed at [facility], [place] [address], [place], [place] [address]   POC occult blood, ED     Status: Abnormal   Collection Time: 03/03/20  3:05 PM  Result Value Ref Range   Fecal Occult Bld POSITIVE (A) NEGATIVE  Protime-INR     Status: None   Collection Time: 03/03/20  3:13 PM  Result Value Ref Range   Prothrombin Time 14.4 11.4 - 15.2 seconds   INR 1.2 0.8 - 1.2    Comment: (NOTE) INR goal varies based on device and disease states. Performed at [facility], [place] [address], [place], [place] [address]   Basic metabolic panel     Status: Abnormal   Collection Time: 03/04/20  5:35 AM  Result Value Ref Range   Sodium 141 135 - 145 mmol/L   Potassium 3.1 (L) 3.5 - 5.1 mmol/L   Chloride 104 98 - 111 mmol/L   CO2 28 22 - 32 mmol/L   Glucose, Bld 93 70 - 99 mg/dL    Comment: Glucose reference range applies only to samples taken after fasting for at least 8 hours.   BUN 15 8 - 23 mg/dL   Creatinine, Ser 1.05 0.61 - 1.24 mg/dL   Calcium 8.9 8.9 - 10.3 mg/dL   GFR, Estimated >60 >60 mL/min    Comment: (NOTE) Calculated using the CKD-EPI Creatinine Equation (2021)    Anion gap 9 5 - 15    Comment: Performed at [place] [address], [place], [place] [address]  CBC     Status: Abnormal   Collection Time: 03/04/20  5:35 AM  Result Value Ref Range   WBC 6.9 4.0 - 10.5 K/uL   RBC 3.79 (L) 4.22 - 5.81 MIL/uL   Hemoglobin 11.6 (L) 13.0 - 17.0 g/dL   HCT 33.5 (L) 39.0 - 52.0 %   MCV 88.4 80.0 - 100.0 fL   MCH 30.6 26.0 - 34.0 pg   MCHC 34.6 30.0 - 36.0 g/dL   RDW 12.5 11.5 - 15.5 %    Platelets 149 (L) 150 - 400 K/uL   nRBC 0.0 0.0 - 0.2 %  Comment: Performed at [facility], [place] [address], [place], [place] [address]    DG Chest 2 View  Result Date: 03/03/2020 CLINICAL DATA:  Hemoptysis. EXAM: CHEST - 2 VIEW COMPARISON:  01/14/2013. FINDINGS: Mediastinum and hilar structures normal. Heart size normal. No focal infiltrate. No pleural effusion or pneumothorax. Degenerative change thoracic spine. IMPRESSION: No acute cardiopulmonary disease. Electronically Signed   By: Marcello Moores  Register   On: 03/03/2020 15:14   CT Soft Tissue Neck W Contrast  Result Date: 03/03/2020 CLINICAL DATA:  Lymphadenopathy, neck. Enlarged lymph nodes, shortness of breath, hemoptysis. EXAM: CT NECK WITH CONTRAST TECHNIQUE: Multidetector CT imaging of the neck was performed using the standard protocol following the bolus administration of intravenous contrast. CONTRAST:  26mL OMNIPAQUE IOHEXOL 300 MG/ML  SOLN COMPARISON:  CT of the paranasal sinuses 04/26/2006. FINDINGS: Pharynx and larynx: Prominent streak and beam hardening artifact arising from dental restoration obscures portions of the oral cavity and pharynx. Asymmetric soft tissue within the right base of tongue and glossotonsillar sulcus regions, extending to the right vallecula and measuring 2.5 x 1.9 x 3.0 cm, likely reflecting a base of tongue squamous cell carcinoma (for instance as seen on series 3, image 31). Salivary glands: There is an ill-defined fat pain between right level 2 cystic/necrotic lymphadenopathy and portions of the right parotid and submandibular glands. The parotid and submandibular glands are otherwise unremarkable. Thyroid: Unremarkable. Lymph nodes: Extensive cystic/necrotic cervical lymphadenopathy at the right level 1, 2, 3 and 4 stations and at the left level 2 station. Cystic/necrotic right level 1 lymph nodes measure up to 2.3 cm in short axis (series 3, image 41). Multiple enlarged cystic/necrotic right level 2  lymph nodes, including a dominant right level 2 nodal conglomerate measuring 4.9 x 4.6 cm in transaxial dimensions (series 3, image 29). This nodal conglomerate has ill-defined margins with the adjacent right sternocleidomastoid muscle, right parotid gland and right submandibular gland and findings are highly suspicious for extracapsular extension. The largest right level 3 lymph node is cystic/necrotic and measures 1.4 cm in short axis (series 3, image 58). An enlarged cystic/necrotic right level 3/4 lymph node measures 1.7 cm in short axis (series 3, image 66). Multiple cystic/necrotic left level 2 lymph nodes measuring up to 2.3 cm in short axis (series 3, images 29 and 36). An enlarged cystic/necrotic left level 2 lymph node has an ill-defined margin with the adjacent left sternocleidomastoid muscle and findings are suspicious for extracapsular extension. Vascular: Cystic/necrotic lymphadenopathy near completely effaces the upper right internal jugular vein. Cystic/necrotic lymphadenopathy also significantly narrows the upper left internal jugular vein. Limited intracranial: No acute intracranial abnormality identified at the imaged levels. Visualized orbits: Excluded from the field of view. Mastoids and visualized paranasal sinuses: Incompletely imaged sequela of prior maxillofacial reconstructive surgery. Moderate mucosal thickening and possible fluid within the imaged right maxillary sinus. Mild mucosal thickening within the imaged left maxillary sinus. No significant mastoid effusion. Skeleton: No acute bony abnormality or aggressive osseous lesion. C4-C5 grade 1 anterolisthesis. Cervical spondylosis. Most notably, there is advanced disc space narrowing and degenerative endplate sclerosis with a disc bulge, endplate spurring and uncovertebral hypertrophy at C5-C6. Bilateral bony neural foraminal narrowing and apparent mild/moderate spinal canal stenosis at this level. Upper chest: No consolidation within the  imaged lung apices. These results were called by telephone at the time of interpretation on 03/03/2020 at 4:54 pm to provider Dr. Tomi Bamberger, who verbally acknowledged these results. IMPRESSION: 2.5 x 1.9 x 3.0 cm right base of tongue  and glossotonsillar sulcus mass extending to the region of the right vallecula, highly suspicious for a squamous cell carcinoma primary tumor. ENT consultation and direct visualization recommended. Extensive bilateral cystic/necrotic nodal metastatic disease at the right level 1, 2, 3 and 4 stations and left level 2 station. Several lymph nodes demonstrate ill-defined margins highly suspicious for extracapsular extension. Lymphadenopathy significantly narrows the upper internal jugular veins bilaterally. Paranasal sinus disease as described. Electronically Signed   By: Kellie Simmering DO   On: 03/03/2020 16:55    Review of Systems  HENT:       Blood in throat  Gastrointestinal: Positive for blood in stool.  All other systems reviewed and are negative.  Blood pressure (!) 144/73, pulse 75, temperature 98.2 F (36.8 C), resp. rate 17, height 5\' 8"  (1.727 m), weight 83.5 kg, SpO2 97 %. Physical Exam Constitutional:      Appearance: Normal appearance. He is normal weight.  HENT:     Head: Normocephalic and atraumatic.     Right Ear: External ear normal.     Left Ear: External ear normal.     Nose: Nose normal.     Mouth/Throat:     Mouth: Mucous membranes are moist.     Pharynx: Oropharynx is clear.     Comments: Normal tongue and oropharynx. Eyes:     Extraocular Movements: Extraocular movements intact.     Conjunctiva/sclera: Conjunctivae normal.     Pupils: Pupils are equal, round, and reactive to light.  Neck:     Comments: Right more than left bulky adenopathy. Cardiovascular:     Rate and Rhythm: Normal rate.  Pulmonary:     Effort: Pulmonary effort is normal.  Musculoskeletal:     Cervical back: Normal range of motion.  Skin:    General: Skin is warm and  dry.  Neurological:     General: No focal deficit present.     Mental Status: He is alert and oriented to person, place, and time.  Psychiatric:        Mood and Affect: Mood normal.        Behavior: Behavior normal.        Thought Content: Thought content normal.        Judgment: Judgment normal.     Assessment/Plan: Tongue base cancer with cervical metastases  I personally reviewed his neck CT demonstrating a right tongue base mass with right more than left bulky adenopathy.  He will need biopsy of the tongue base via laryngoscopy in the operating room setting.  This is often done as an outpatient but will look at options while he is here, not today.  Assuming a cancer diagnosis, treatment will be combination chemotherapy and radiation therapy.  A PET scan will be needed as an outpatient.  Melida Quitter 03/04/2020, 8:38 AM

## 2020-03-04 NOTE — Discharge Summary (Signed)
Physician Discharge Summary  Jerry Cook XUX:[id number] DOB: [birth year] DOA: 03/03/2020  PCP: Shirline Frees, MD  Admit date: 03/03/2020 Discharge date: 03/06/2020  Admitted From: home Disposition:  home   Recommendations for Outpatient Follow-up:  1. Please order an anemia panel as outpt  Home Health:  none  Discharge Condition:  stable   CODE STATUS:  Full code   Diet recommendation:  Heart healthy Consultations:  ENT  GI  Procedures/Studies: . none   Discharge Diagnoses:  Principal Problem:   Hemoptysis Active Problems:   Melena   HTN (hypertension), benign   BPH (benign prostatic hyperplasia)     Brief Summary: Jerry Cook is a 70 y.o. male with medical history significant of HTN, history of polyp and rectal bleed, BPH, COPD, HLD, presented with worsening hemoptysis and dark stools.   Patient had first episode of coughing up blood 3 to 4 weeks ago woke up with a big blood clot in the mouth, and this morning, he woke on Monday felt his mouth is full of blood he has some cough but no more blood coming oout.  Same time, he reported on and off throat pain, no fever chills.  He denied any shortness of breath no chest pains, and he did not notice any voice changes.  He lost about 20 pounds in the last year but gained back 10 pounds recently.  Denies any appetite changes.  He has history of colon polyps and has been having on and off rectal bleed which been getting worse lately, he has been noticed streak of blood covering stool and dark-colored stool since last week.  No abdominal pain.  He reported he has a mass on the right side of the neck developed about 2 years ago and gradually getting bigger.  Denies any associated pain.  Quit smoking 40 years ago, drink 1-2 beers a week.  Hospital Course:  Hemoptysis - due to mass in right neck- He has been evaluated by Dr Hazeline Junker in the hospital and plans to follow up as outpatient  Melena? and diarrhea - no bleeding noted  in the hospital - suspected to be partly due to swallowing blood from above mass - evaluated by GI> recommended to take Protonix daily along with Align (for diarrhea) and f/u as outpatient  Bright red blood when wiping after BM - He states he has has some bleeding when wiping which he is attributing to local irritation  Anemia - Hb is 11.6 today- per Dr Cristina Gong, his Hb was about 15 some months ago - recommend an anemia panel by PCP   Discharge Exam: Vitals:   03/04/20 0542 03/04/20 0704  BP: (!) 142/77 (!) 144/73  Pulse: (!) 56 75  Resp: 19 17  Temp:    SpO2: 94% 97%   Vitals:   03/04/20 0239 03/04/20 0300 03/04/20 0542 03/04/20 0704  BP: 114/67 115/65 (!) 142/77 (!) 144/73  Pulse: 65 65 (!) 56 75  Resp: 10 14 19 17   Temp:  98.2 F (36.8 C)    TempSrc:   Oral   SpO2: 94% 98% 94% 97%  Weight:      Height:        General: Pt is alert, awake, not in acute distress Cardiovascular: RRR, S1/S2 +, no rubs, no gallops Respiratory: CTA bilaterally, no wheezing, no rhonchi Abdominal: Soft, NT, ND, bowel sounds + Extremities: no edema, no cyanosis   Discharge Instructions  Discharge Instructions    Diet - low sodium heart healthy  Complete by: As directed    Increase activity slowly   Complete by: As directed      Allergies as of 03/04/2020   No Known Allergies     Medication List    TAKE these medications   atorvastatin 10 MG tablet Commonly known as: LIPITOR Take 10 mg by mouth at bedtime.   cetirizine 10 MG tablet Commonly known as: ZYRTEC Take 10 mg by mouth daily.   clonazePAM 2 MG tablet Commonly known as: KLONOPIN Take 2 mg by mouth at bedtime.   finasteride 5 MG tablet Commonly known as: PROSCAR Take 5 mg by mouth daily.   fluticasone 50 MCG/ACT nasal spray Commonly known as: FLONASE Place 2 sprays into both nostrils as needed for allergies.   metoprolol tartrate 50 MG tablet Commonly known as: LOPRESSOR Take 50 mg by mouth 2 (two) times  daily.   omeprazole 20 MG capsule Commonly known as: PRILOSEC Take 20 mg by mouth daily.   PARoxetine 20 MG tablet Commonly known as: PAXIL Take 20 mg by mouth daily.   sildenafil 20 MG tablet Commonly known as: REVATIO Take 100 mg by mouth as needed (erectile dysfunction).   tamsulosin 0.4 MG Caps capsule Commonly known as: FLOMAX Take 0.4 mg by mouth at bedtime.   traZODone 100 MG tablet Commonly known as: DESYREL Take 100 mg by mouth at bedtime.   triamterene-hydrochlorothiazide 37.5-25 MG capsule Commonly known as: DYAZIDE Take 1 capsule by mouth daily.       Follow-up Information    Melissa Montane, MD Follow up.   Specialty: Otolaryngology Contact information: [place] [address] [phone]        Leta Baptist, MD Follow up.   Specialty: Otolaryngology Contact information: [place] [address] [phone]        Shirline Frees, MD Follow up.   Specialty: Family Medicine Contact information: [place] [address] [phone]              No Known Allergies    DG Chest 2 View  Result Date: 03/03/2020 CLINICAL DATA:  Hemoptysis. EXAM: CHEST - 2 VIEW COMPARISON:  01/14/2013. FINDINGS: Mediastinum and hilar structures normal. Heart size normal. No focal infiltrate. No pleural effusion or pneumothorax. Degenerative change thoracic spine. IMPRESSION: No acute cardiopulmonary disease. Electronically Signed   By: Marcello Moores  Register   On: 03/03/2020 15:14   CT Soft Tissue Neck W Contrast  Result Date: 03/03/2020 CLINICAL DATA:  Lymphadenopathy, neck. Enlarged lymph nodes, shortness of breath, hemoptysis. EXAM: CT NECK WITH CONTRAST TECHNIQUE: Multidetector CT imaging of the neck was performed using the standard protocol following the bolus administration of intravenous contrast. CONTRAST:  20mL OMNIPAQUE IOHEXOL 300 MG/ML  SOLN COMPARISON:  CT of the paranasal sinuses 04/26/2006. FINDINGS:  Pharynx and larynx: Prominent streak and beam hardening artifact arising from dental restoration obscures portions of the oral cavity and pharynx. Asymmetric soft tissue within the right base of tongue and glossotonsillar sulcus regions, extending to the right vallecula and measuring 2.5 x 1.9 x 3.0 cm, likely reflecting a base of tongue squamous cell carcinoma (for instance as seen on series 3, image 31). Salivary glands: There is an ill-defined fat pain between right level 2 cystic/necrotic lymphadenopathy and portions of the right parotid and submandibular glands. The parotid and submandibular glands are otherwise unremarkable. Thyroid: Unremarkable. Lymph nodes: Extensive cystic/necrotic cervical lymphadenopathy at the right level 1, 2, 3 and 4 stations and  at the left level 2 station. Cystic/necrotic right level 1 lymph nodes measure up to 2.3 cm in short axis (series 3, image 41). Multiple enlarged cystic/necrotic right level 2 lymph nodes, including a dominant right level 2 nodal conglomerate measuring 4.9 x 4.6 cm in transaxial dimensions (series 3, image 29). This nodal conglomerate has ill-defined margins with the adjacent right sternocleidomastoid muscle, right parotid gland and right submandibular gland and findings are highly suspicious for extracapsular extension. The largest right level 3 lymph node is cystic/necrotic and measures 1.4 cm in short axis (series 3, image 58). An enlarged cystic/necrotic right level 3/4 lymph node measures 1.7 cm in short axis (series 3, image 66). Multiple cystic/necrotic left level 2 lymph nodes measuring up to 2.3 cm in short axis (series 3, images 29 and 36). An enlarged cystic/necrotic left level 2 lymph node has an ill-defined margin with the adjacent left sternocleidomastoid muscle and findings are suspicious for extracapsular extension. Vascular: Cystic/necrotic lymphadenopathy near completely effaces the upper right internal jugular vein. Cystic/necrotic  lymphadenopathy also significantly narrows the upper left internal jugular vein. Limited intracranial: No acute intracranial abnormality identified at the imaged levels. Visualized orbits: Excluded from the field of view. Mastoids and visualized paranasal sinuses: Incompletely imaged sequela of prior maxillofacial reconstructive surgery. Moderate mucosal thickening and possible fluid within the imaged right maxillary sinus. Mild mucosal thickening within the imaged left maxillary sinus. No significant mastoid effusion. Skeleton: No acute bony abnormality or aggressive osseous lesion. C4-C5 grade 1 anterolisthesis. Cervical spondylosis. Most notably, there is advanced disc space narrowing and degenerative endplate sclerosis with a disc bulge, endplate spurring and uncovertebral hypertrophy at C5-C6. Bilateral bony neural foraminal narrowing and apparent mild/moderate spinal canal stenosis at this level. Upper chest: No consolidation within the imaged lung apices. These results were called by telephone at the time of interpretation on 03/03/2020 at 4:54 pm to provider Dr. Tomi Bamberger, who verbally acknowledged these results. IMPRESSION: 2.5 x 1.9 x 3.0 cm right base of tongue and glossotonsillar sulcus mass extending to the region of the right vallecula, highly suspicious for a squamous cell carcinoma primary tumor. ENT consultation and direct visualization recommended. Extensive bilateral cystic/necrotic nodal metastatic disease at the right level 1, 2, 3 and 4 stations and left level 2 station. Several lymph nodes demonstrate ill-defined margins highly suspicious for extracapsular extension. Lymphadenopathy significantly narrows the upper internal jugular veins bilaterally. Paranasal sinus disease as described. Electronically Signed   By: Kellie Simmering DO   On: 03/03/2020 16:55     The results of significant diagnostics from this hospitalization (including imaging, microbiology, ancillary and laboratory) are listed below  for reference.     Microbiology: Recent Results (from the past 240 hour(s))  SARS CORONAVIRUS 2 (TAT 6-24 HRS) Nasopharyngeal Nasopharyngeal Swab     Status: None   Collection Time: 03/03/20  2:29 PM   Specimen: Nasopharyngeal Swab  Result Value Ref Range Status   SARS Coronavirus 2 NEGATIVE NEGATIVE Final    Comment: (NOTE) SARS-CoV-2 target nucleic acids are NOT DETECTED.  The SARS-CoV-2 RNA is generally detectable in upper and lower respiratory specimens during the acute phase of infection. Negative results do not preclude SARS-CoV-2 infection, do not rule out co-infections with other pathogens, and should not be used as the sole basis for treatment or other patient management decisions. Negative results must be combined with clinical observations, patient history, and epidemiological information. The expected result is Negative.  Fact Sheet for Patients: SugarRoll.be  Fact Sheet for Healthcare Providers: https://www.woods-mathews.com/  This test is not yet approved or cleared by the [place] and  has been authorized for detection and/or diagnosis of SARS-CoV-2 by FDA under an Emergency Use Authorization (EUA). This EUA will remain  in effect (meaning this test can be used) for the duration of the COVID-19 declaration under Se ction 564(b)(1) of the Act, 21 U.S.C. section 360bbb-3(b)(1), unless the authorization is terminated or revoked sooner.  Performed at [facility], [place] [address], [place], [place] [address]      Labs: BNP (last 3 results) No results for input(s): BNP in the last 8760 hours. Basic Metabolic Panel: Recent Labs  Lab 03/03/20 1303 03/04/20 0535  NA 142 141  K 3.5 3.1*  CL 104 104  CO2 29 28  GLUCOSE 110* 93  BUN 23 15  CREATININE 1.17 1.05  CALCIUM 8.6* 8.9   Liver Function Tests: Recent Labs  Lab 03/03/20 1303  AST 27  ALT 32  ALKPHOS 59  BILITOT 0.5  PROT 6.0*  ALBUMIN  3.3*   No results for input(s): LIPASE, AMYLASE in the last 168 hours. No results for input(s): AMMONIA in the last 168 hours. CBC: Recent Labs  Lab 03/03/20 1303 03/04/20 0535  WBC 9.0 6.9  HGB 12.0* 11.6*  HCT 37.0* 33.5*  MCV 91.1 88.4  PLT 155 149*   Cardiac Enzymes: No results for input(s): CKTOTAL, CKMB, CKMBINDEX, TROPONINI in the last 168 hours. BNP: Invalid input(s): POCBNP CBG: No results for input(s): GLUCAP in the last 168 hours. D-Dimer No results for input(s): DDIMER in the last 72 hours. Hgb A1c No results for input(s): HGBA1C in the last 72 hours. Lipid Profile No results for input(s): CHOL, HDL, LDLCALC, TRIG, CHOLHDL, LDLDIRECT in the last 72 hours. Thyroid function studies No results for input(s): TSH, T4TOTAL, T3FREE, THYROIDAB in the last 72 hours.  Invalid input(s): FREET3 Anemia work up Recent Labs    03/04/20 0853  FERRITIN 143  TIBC 235*  IRON 43*   Urinalysis No results found for: COLORURINE, APPEARANCEUR, LABSPEC, [place], GLUCOSEU, [place], [place], [place], PROTEINUR, UROBILINOGEN, NITRITE, LEUKOCYTESUR Sepsis Labs Invalid input(s): PROCALCITONIN,  WBC,  LACTICIDVEN Microbiology Recent Results (from the past 240 hour(s))  SARS CORONAVIRUS 2 (TAT 6-24 HRS) Nasopharyngeal Nasopharyngeal Swab     Status: None   Collection Time: 03/03/20  2:29 PM   Specimen: Nasopharyngeal Swab  Result Value Ref Range Status   SARS Coronavirus 2 NEGATIVE NEGATIVE Final    Comment: (NOTE) SARS-CoV-2 target nucleic acids are NOT DETECTED.  The SARS-CoV-2 RNA is generally detectable in upper and lower respiratory specimens during the acute phase of infection. Negative results do not preclude SARS-CoV-2 infection, do not rule out co-infections with other pathogens, and should not be used as the sole basis for treatment or other patient management decisions. Negative results must be combined with clinical observations, patient history, and  epidemiological information. The expected result is Negative.  Fact Sheet for Patients: SugarRoll.be  Fact Sheet for Healthcare Providers: https://www.woods-mathews.com/  This test is not yet approved or cleared by the [place] FDA and  has been authorized for detection and/or diagnosis of SARS-CoV-2 by FDA under an Emergency Use Authorization (EUA). This EUA will remain  in effect (meaning this test can be used) for the duration of the COVID-19 declaration under Se ction 564(b)(1) of the Act, 21 U.S.C. section 360bbb-3(b)(1), unless the authorization is terminated or revoked sooner.  Performed at [facility], [place] [address] [place], [place] [address]  Time coordinating discharge in minutes: 65  SIGNED:   Debbe Odea, MD  [facility] 03/06/2020, 3:27 PM

## 2020-03-04 NOTE — ED Notes (Addendum)
Attempted to give reportx1 

## 2020-03-04 NOTE — Care Management CC44 (Signed)
Condition Code 44 Documentation Completed  Patient Details  Name: Jerry Cook MRN: [id number] Date of Birth: October 11, 1950   Condition Code 44 given:  Yes Patient signature on Condition Code 44 notice:  Yes Documentation of 2 MD's agreement:  Yes Code 44 added to claim:  Yes    Geralynn Ochs, LCSW 03/04/2020, 11:33 AM

## 2020-03-04 NOTE — Care Management Obs Status (Signed)
[place] NOTIFICATION   Patient Details  Name: Jerry Cook MRN: [id number] Date of Birth: 1950/10/03   Medicare Observation Status Notification Given:  Yes    Geralynn Ochs, [place] 03/04/2020, 11:33 AM

## 2020-03-04 NOTE — Progress Notes (Signed)
Received report from 3W RN; in room giving pt care when report called from ED @ [phone].

## 2020-03-05 ENCOUNTER — Other Ambulatory Visit: Payer: Self-pay | Admitting: Otolaryngology

## 2020-03-06 DIAGNOSIS — I10 Essential (primary) hypertension: Secondary | ICD-10-CM

## 2020-03-06 DIAGNOSIS — N400 Enlarged prostate without lower urinary tract symptoms: Secondary | ICD-10-CM

## 2020-03-11 ENCOUNTER — Other Ambulatory Visit (HOSPITAL_COMMUNITY)
Admission: RE | Admit: 2020-03-11 | Discharge: 2020-03-11 | Disposition: A | Discharge status: Home / Self Care | Payer: Medicare HMO | Source: Ambulatory Visit | Attending: Otolaryngology | Admitting: Otolaryngology

## 2020-03-11 DIAGNOSIS — Z01812 Encounter for preprocedural laboratory examination: Secondary | ICD-10-CM | POA: Diagnosis not present

## 2020-03-11 DIAGNOSIS — Z20822 Contact with and (suspected) exposure to covid-19: Secondary | ICD-10-CM | POA: Diagnosis not present

## 2020-03-11 LAB — SARS CORONAVIRUS 2 (TAT 6-24 HRS): SARS Coronavirus 2: NEGATIVE

## 2020-03-12 ENCOUNTER — Other Ambulatory Visit: Payer: Self-pay

## 2020-03-12 ENCOUNTER — Encounter (HOSPITAL_COMMUNITY): Payer: Self-pay | Admitting: Otolaryngology

## 2020-03-12 NOTE — Progress Notes (Addendum)
Jerry Cook has not answered the phone, I can not tell if patient has  voice mail, there is a beep after recording that patient is unable to take the call , I left a message, maybe it was recorded. I called  Patient's emergency contact, Jerry Cook.  Jerry Cook said that she is a friend, she had not spoken to Jerry Cook for months until he called and asked for a ride to and from the hospital. Jerry Cook said she is not able to stay with patient and he can not stay with her after surgery.  I called Dr. Redmond Baseman, medical assistant , Janace Hoard, I left a voice message that Jerry Cook does not have anyone to stay with him Friday after surgery for the 24 hours after surgery.  Jerry Cook called me, patient denies chest pain or shortness of breath. Patient tested negative for Covid and has been in quarantine since that time.  Jerry Cook name Jerry Cook as the one that would stay with him  Friday - Saturday. I told Jerry Cook that Jerry Cook said that she cannot stay with patient. Jerry Cook said we will work it out. I told Jerry Cook that I had left a message at Dr. Redmond Baseman office - no one to stay with him the 24 hour period after surgery to see if there is anything he can do.

## 2020-03-13 ENCOUNTER — Encounter (HOSPITAL_COMMUNITY)
Admission: RE | Disposition: A | Discharge status: Home / Self Care | Payer: Self-pay | Source: Home / Self Care | Attending: Otolaryngology

## 2020-03-13 ENCOUNTER — Ambulatory Visit (HOSPITAL_COMMUNITY): Payer: Medicare HMO | Admitting: Certified Registered Nurse Anesthetist

## 2020-03-13 ENCOUNTER — Encounter (HOSPITAL_COMMUNITY): Payer: Self-pay | Admitting: Otolaryngology

## 2020-03-13 ENCOUNTER — Ambulatory Visit (HOSPITAL_COMMUNITY)
Admission: RE | Admit: 2020-03-13 | Discharge: 2020-03-13 | Disposition: A | Discharge status: Home / Self Care | Payer: Medicare HMO | Attending: Otolaryngology | Admitting: Otolaryngology

## 2020-03-13 DIAGNOSIS — R042 Hemoptysis: Secondary | ICD-10-CM | POA: Insufficient documentation

## 2020-03-13 DIAGNOSIS — E785 Hyperlipidemia, unspecified: Secondary | ICD-10-CM | POA: Diagnosis not present

## 2020-03-13 DIAGNOSIS — C7989 Secondary malignant neoplasm of other specified sites: Secondary | ICD-10-CM | POA: Insufficient documentation

## 2020-03-13 DIAGNOSIS — C01 Malignant neoplasm of base of tongue: Secondary | ICD-10-CM | POA: Insufficient documentation

## 2020-03-13 DIAGNOSIS — Z87891 Personal history of nicotine dependence: Secondary | ICD-10-CM | POA: Diagnosis not present

## 2020-03-13 DIAGNOSIS — Z79899 Other long term (current) drug therapy: Secondary | ICD-10-CM | POA: Insufficient documentation

## 2020-03-13 DIAGNOSIS — I10 Essential (primary) hypertension: Secondary | ICD-10-CM | POA: Diagnosis not present

## 2020-03-13 DIAGNOSIS — C770 Secondary and unspecified malignant neoplasm of lymph nodes of head, face and neck: Secondary | ICD-10-CM | POA: Diagnosis not present

## 2020-03-13 DIAGNOSIS — K219 Gastro-esophageal reflux disease without esophagitis: Secondary | ICD-10-CM | POA: Insufficient documentation

## 2020-03-13 HISTORY — PX: LARYNGOSCOPY AND BRONCHOSCOPY: SHX5659

## 2020-03-13 HISTORY — DX: Gastro-esophageal reflux disease without esophagitis: K21.9

## 2020-03-13 LAB — BASIC METABOLIC PANEL
Anion gap: 10 (ref 5–15)
BUN: 12 mg/dL (ref 8–23)
CO2: 26 mmol/L (ref 22–32)
Calcium: 9.2 mg/dL (ref 8.9–10.3)
Chloride: 101 mmol/L (ref 98–111)
Creatinine, Ser: 1.21 mg/dL (ref 0.61–1.24)
GFR, Estimated: >60 mL/min (ref >60)
Glucose, Bld: 99 mg/dL (ref 70–99)
Potassium: 4.3 mmol/L (ref 3.5–5.1)
Sodium: 137 mmol/L (ref 135–145)

## 2020-03-13 LAB — CBC
HCT: 39.6 % (ref 39.0–52.0)
Hemoglobin: 13.2 g/dL (ref 13.0–17.0)
MCH: 30.1 pg (ref 26.0–34.0)
MCHC: 33.3 g/dL (ref 30.0–36.0)
MCV: 90.2 fL (ref 80.0–100.0)
Platelets: 196 10*3/uL (ref 150–400)
RBC: 4.39 MIL/uL (ref 4.22–5.81)
RDW: 12.8 % (ref 11.5–15.5)
WBC: 7.3 10*3/uL (ref 4.0–10.5)
nRBC: 0 % (ref 0.0–0.2)

## 2020-03-13 SURGERY — LARYNGOSCOPY, WITH BRONCHOSCOPY
Anesthesia: General | Site: Throat

## 2020-03-13 MED ORDER — OXYCODONE HCL 5 MG PO TABS: 5.0000 mg | ORAL_TABLET | Freq: Once | ORAL | Status: DC | PRN

## 2020-03-13 MED ORDER — OXYMETAZOLINE HCL 0.05 % NA SOLN
NASAL | Status: AC
  Filled 2020-03-13: qty 30

## 2020-03-13 MED ORDER — ONDANSETRON HCL 4 MG/2ML IJ SOLN
INTRAMUSCULAR | Status: DC | PRN
  Administered 2020-03-13: 4 mg via INTRAVENOUS

## 2020-03-13 MED ORDER — LIDOCAINE 2% (20 MG/ML) 5 ML SYRINGE
INTRAMUSCULAR | Status: AC
  Filled 2020-03-13: qty 10

## 2020-03-13 MED ORDER — LACTATED RINGERS IV SOLN: INTRAVENOUS | Status: DC

## 2020-03-13 MED ORDER — SUGAMMADEX SODIUM 200 MG/2ML IV SOLN
INTRAVENOUS | Status: DC | PRN
  Administered 2020-03-13: 350 mg via INTRAVENOUS

## 2020-03-13 MED ORDER — FENTANYL CITRATE (PF) 100 MCG/2ML IJ SOLN: 25.0000 ug | INTRAMUSCULAR | Status: DC | PRN

## 2020-03-13 MED ORDER — ORAL CARE MOUTH RINSE: 15.0000 mL | Freq: Once | OROMUCOSAL | Status: AC

## 2020-03-13 MED ORDER — MIDAZOLAM HCL 5 MG/5ML IJ SOLN
INTRAMUSCULAR | Status: DC | PRN
  Administered 2020-03-13: 2 mg via INTRAVENOUS

## 2020-03-13 MED ORDER — CHLORHEXIDINE GLUCONATE 0.12 % MT SOLN
15.0000 mL | Freq: Once | OROMUCOSAL | Status: AC
  Administered 2020-03-13: 15 mL via OROMUCOSAL
  Filled 2020-03-13: qty 15

## 2020-03-13 MED ORDER — DEXAMETHASONE SODIUM PHOSPHATE 10 MG/ML IJ SOLN
INTRAMUSCULAR | Status: DC | PRN
Start: 2020-03-13 — End: 2020-03-13
  Administered 2020-03-13: 10 mg via INTRAVENOUS

## 2020-03-13 MED ORDER — MIDAZOLAM HCL 2 MG/2ML IJ SOLN
INTRAMUSCULAR | Status: AC
  Filled 2020-03-13: qty 2

## 2020-03-13 MED ORDER — ONDANSETRON HCL 4 MG/2ML IJ SOLN: 4.0000 mg | Freq: Once | INTRAMUSCULAR | Status: DC | PRN

## 2020-03-13 MED ORDER — PROPOFOL 10 MG/ML IV BOLUS
INTRAVENOUS | Status: AC
  Filled 2020-03-13: qty 20

## 2020-03-13 MED ORDER — PROPOFOL 10 MG/ML IV BOLUS
INTRAVENOUS | Status: DC | PRN
  Administered 2020-03-13: 180 mg via INTRAVENOUS

## 2020-03-13 MED ORDER — FENTANYL CITRATE (PF) 100 MCG/2ML IJ SOLN
INTRAMUSCULAR | Status: DC | PRN
  Administered 2020-03-13: 100 ug via INTRAVENOUS
  Administered 2020-03-13: 50 ug via INTRAVENOUS

## 2020-03-13 MED ORDER — DEXAMETHASONE SODIUM PHOSPHATE 10 MG/ML IJ SOLN
INTRAMUSCULAR | Status: AC
  Filled 2020-03-13: qty 2

## 2020-03-13 MED ORDER — ONDANSETRON HCL 4 MG/2ML IJ SOLN
INTRAMUSCULAR | Status: AC
  Filled 2020-03-13: qty 2

## 2020-03-13 MED ORDER — ROCURONIUM BROMIDE 10 MG/ML (PF) SYRINGE
PREFILLED_SYRINGE | INTRAVENOUS | Status: AC
  Filled 2020-03-13: qty 20

## 2020-03-13 MED ORDER — ARTIFICIAL TEARS OPHTHALMIC OINT
TOPICAL_OINTMENT | OPHTHALMIC | Status: AC
  Filled 2020-03-13: qty 3.5

## 2020-03-13 MED ORDER — FENTANYL CITRATE (PF) 250 MCG/5ML IJ SOLN
INTRAMUSCULAR | Status: AC
  Filled 2020-03-13: qty 5

## 2020-03-13 MED ORDER — OXYCODONE HCL 5 MG/5ML PO SOLN: 5.0000 mg | Freq: Once | ORAL | Status: DC | PRN

## 2020-03-13 MED ORDER — LIDOCAINE 2% (20 MG/ML) 5 ML SYRINGE
INTRAMUSCULAR | Status: DC | PRN
  Administered 2020-03-13: 60 mg via INTRAVENOUS

## 2020-03-13 MED ORDER — EPINEPHRINE HCL (NASAL) 0.1 % NA SOLN
NASAL | Status: AC
  Filled 2020-03-13: qty 30

## 2020-03-13 MED ORDER — ROCURONIUM BROMIDE 10 MG/ML (PF) SYRINGE
PREFILLED_SYRINGE | INTRAVENOUS | Status: DC | PRN
  Administered 2020-03-13: 60 mg via INTRAVENOUS

## 2020-03-13 SURGICAL SUPPLY — 39 items
BALLN PULM 15 16.5 18X75 (BALLOONS)
BALLOON PULM 15 16.5 18X75 (BALLOONS) IMPLANT
BLADE SURG 15 STRL LF DISP TIS (BLADE) IMPLANT
BLADE SURG 15 STRL SS (BLADE)
CANISTER SUCT 3000ML PPV (MISCELLANEOUS) ×2 IMPLANT
CATH BALLOON 10X40MM (CATHETERS) IMPLANT
CNTNR URN SCR LID CUP LEK RST (MISCELLANEOUS) IMPLANT
CONT SPEC 4OZ STRL OR WHT (MISCELLANEOUS)
COVER BACK TABLE 60X90IN (DRAPES) ×2 IMPLANT
COVER MAYO STAND STRL (DRAPES) ×2 IMPLANT
COVER WAND RF STERILE (DRAPES) ×2 IMPLANT
DEVICE INFLATION 20/61 (MISCELLANEOUS) IMPLANT
DRAPE HALF SHEET 40X57 (DRAPES) ×2 IMPLANT
GAUZE 4X4 16PLY RFD (DISPOSABLE) ×2 IMPLANT
GAUZE SPONGE 4X4 12PLY STRL (GAUZE/BANDAGES/DRESSINGS) IMPLANT
GLOVE BIO SURGEON STRL SZ7.5 (GLOVE) ×2 IMPLANT
GOWN STRL REUS W/ TWL LRG LVL3 (GOWN DISPOSABLE) IMPLANT
GOWN STRL REUS W/TWL LRG LVL3 (GOWN DISPOSABLE)
GUARD TEETH (MISCELLANEOUS) ×2 IMPLANT
KIT BASIN OR (CUSTOM PROCEDURE TRAY) ×2 IMPLANT
KIT TURNOVER KIT B (KITS) ×2 IMPLANT
NDL HYPO 25GX1X1/2 BEV (NEEDLE) IMPLANT
NDL SPNL 22GX3.5 QUINCKE BK (NEEDLE) IMPLANT
NDL TRANS ORAL INJECTION (NEEDLE) IMPLANT
NEEDLE HYPO 25GX1X1/2 BEV (NEEDLE) IMPLANT
NEEDLE SPNL 22GX3.5 QUINCKE BK (NEEDLE) IMPLANT
NEEDLE TRANS ORAL INJECTION (NEEDLE) IMPLANT
NS IRRIG 1000ML POUR BTL (IV SOLUTION) ×2 IMPLANT
PAD ARMBOARD 7.5X6 YLW CONV (MISCELLANEOUS) ×4 IMPLANT
PATTIES SURGICAL .5 X1 (DISPOSABLE) ×2 IMPLANT
POSITIONER HEAD DONUT 9IN (MISCELLANEOUS) IMPLANT
SET COLLECT BLD 25X3/4 12 (NEEDLE) ×2 IMPLANT
SOL ANTI FOG 6CC (MISCELLANEOUS) IMPLANT
SOLUTION ANTI FOG 6CC (MISCELLANEOUS)
SURGILUBE 2OZ TUBE FLIPTOP (MISCELLANEOUS) IMPLANT
SYSTEM BALLN DILATION 16X40 (BALLOONS) IMPLANT
TOWEL GREEN STERILE FF (TOWEL DISPOSABLE) ×4 IMPLANT
TUBE CONNECTING 12X1/4 (SUCTIONS) ×2 IMPLANT
WATER STERILE IRR 1000ML POUR (IV SOLUTION) IMPLANT

## 2020-03-13 NOTE — Brief Op Note (Signed)
03/13/2020  1:28 PM  PATIENT:  Jerry Cook  70 y.o. male  PRE-OPERATIVE DIAGNOSIS:  Tongue base cancer with cervical metastases  POST-OPERATIVE DIAGNOSIS:  Tongue base cancer with cervical metastases  PROCEDURE:  Procedure(s): Direct LARYNGOSCOPY WITH BX  AND ESPHOGOSCOPY (N/A)  SURGEON:  Surgeon(s) and Role:    Melida Quitter, MD - Primary  PHYSICIAN ASSISTANT:   ASSISTANTS: none   ANESTHESIA:   general  EBL:  0 mL   BLOOD ADMINISTERED:none  DRAINS: none   LOCAL MEDICATIONS USED:  NONE  SPECIMEN:  Source of Specimen:  Right tongue base mass  DISPOSITION OF SPECIMEN:  PATHOLOGY  COUNTS:  YES  TOURNIQUET:  * No tourniquets in log *  DICTATION: .Note written in EPIC  PLAN OF CARE: Discharge to home after PACU  PATIENT DISPOSITION:  PACU - hemodynamically stable.   Delay start of Pharmacological VTE agent (>24hrs) due to surgical blood loss or risk of bleeding: no

## 2020-03-13 NOTE — H&P (Signed)
Jerry Cook is an 70 y.o. male.   Chief Complaint: Tongue base cancer HPI: 71 year old male recently admitted to hospital with hemoptysis and rectal bleeding.  He has had a right neck mass for the past few weeks.  Imaging demonstrates a tongue base mass.  He presents for surgical management.  Past Medical History:  Diagnosis Date  . GERD (gastroesophageal reflux disease)   . Hyperlipidemia   . Hypertension     Past Surgical History:  Procedure Laterality Date  . EYE SURGERY Bilateral    cataract  . TONSILLECTOMY      History reviewed. No pertinent family history. Social History:  reports that he has quit smoking. He quit after 13.00 years of use. He has never used smokeless tobacco. He reports current alcohol use of about 1.0 standard drink of alcohol per week. No history on file for drug use.  Allergies: No Known Allergies  Medications Prior to Admission  Medication Sig Dispense Refill  . atorvastatin (LIPITOR) 10 MG tablet Take 10 mg by mouth at bedtime.    . cetirizine (ZYRTEC) 10 MG tablet Take 10 mg by mouth daily.    . clonazePAM (KLONOPIN) 2 MG tablet Take 2 mg by mouth at bedtime.    . finasteride (PROSCAR) 5 MG tablet Take 5 mg by mouth daily.    . fluticasone (FLONASE) 50 MCG/ACT nasal spray Place 2 sprays into both nostrils as needed for allergies.    . metoprolol tartrate (LOPRESSOR) 50 MG tablet Take 50 mg by mouth 2 (two) times daily.    [name] omeprazole (PRILOSEC) 20 MG capsule Take 20 mg by mouth daily.    [name] PARoxetine (PAXIL) 20 MG tablet Take 20 mg by mouth daily.    . tamsulosin (FLOMAX) 0.4 MG CAPS capsule Take 0.4 mg by mouth at bedtime.    . traZODone (DESYREL) 100 MG tablet Take 100 mg by mouth at bedtime.    . sildenafil (REVATIO) 20 MG tablet Take 100 mg by mouth as needed (erectile dysfunction).    . triamterene-hydrochlorothiazide (DYAZIDE) 37.5-25 MG capsule Take 1 capsule by mouth daily.      Results for orders placed or performed during the hospital  encounter of 03/13/20 (from the past 48 hour(s))  CBC per protocol     Status: None   Collection Time: 03/13/20 11:06 AM  Result Value Ref Range   WBC 7.3 4.0 - 10.5 K/uL   RBC 4.39 4.22 - 5.81 MIL/uL   Hemoglobin 13.2 13.0 - 17.0 g/dL   HCT 39.6 39.0 - 52.0 %   MCV 90.2 80.0 - 100.0 fL   MCH 30.1 26.0 - 34.0 pg   MCHC 33.3 30.0 - 36.0 g/dL   RDW 12.8 11.5 - 15.5 %   Platelets 196 150 - 400 K/uL   nRBC 0.0 0.0 - 0.2 %    Comment: Performed at [facility], [place] [address], [place], [place] [address]  Basic metabolic panel per protocol     Status: None   Collection Time: 03/13/20 11:06 AM  Result Value Ref Range   Sodium 137 135 - 145 mmol/L   Potassium 4.3 3.5 - 5.1 mmol/L   Chloride 101 98 - 111 mmol/L   CO2 26 22 - 32 mmol/L   Glucose, Bld 99 70 - 99 mg/dL    Comment: Glucose reference range applies only to samples taken after fasting for at least 8 hours.   BUN 12 8 - 23 mg/dL   Creatinine,  Ser 1.21 0.61 - 1.24 mg/dL   Calcium 9.2 8.9 - 10.3 mg/dL   GFR, Estimated >60 >60 mL/min    Comment: (NOTE) Calculated using the CKD-EPI Creatinine Equation (2021)    Anion gap 10 5 - 15    Comment: Performed at [place] [address], [place], [place] [address]   No results found.  Review of Systems  All other systems reviewed and are negative.   Blood pressure 138/61, pulse 64, temperature 98.1 F (36.7 C), temperature source Oral, resp. rate 17, height 5\' 8"  (1.727 m), weight 77.1 kg, SpO2 99 %. Physical Exam Constitutional:      Appearance: Normal appearance. He is normal weight.  HENT:     Head: Normocephalic and atraumatic.     Right Ear: External ear normal.     Left Ear: External ear normal.     Nose: Nose normal.     Mouth/Throat:     Mouth: Mucous membranes are moist.     Pharynx: Oropharynx is clear.  Eyes:     Extraocular Movements: Extraocular movements intact.     Conjunctiva/sclera: Conjunctivae normal.     Pupils: Pupils are equal, round,  and reactive to light.  Neck:     Comments: Right neck mass Cardiovascular:     Rate and Rhythm: Normal rate.  Pulmonary:     Effort: Pulmonary effort is normal.  Musculoskeletal:     Cervical back: Normal range of motion.  Skin:    General: Skin is warm and dry.  Neurological:     General: No focal deficit present.     Mental Status: He is alert and oriented to person, place, and time.  Psychiatric:        Mood and Affect: Mood normal.        Behavior: Behavior normal.        Thought Content: Thought content normal.        Judgment: Judgment normal.      Assessment/Plan Tongue base cancer with neck metastasis  To OR for direct laryngoscopy with biopsy and esophagoscopy.  Melida Quitter, MD 03/13/2020, 12:41 PM

## 2020-03-13 NOTE — Anesthesia Procedure Notes (Signed)
Procedure Name: Intubation Date/Time: 03/13/2020 1:16 PM Performed by: Candis Shine, CRNA Pre-anesthesia Checklist: Patient identified, Emergency Drugs available, Suction available and Patient being monitored Patient Re-evaluated:Patient Re-evaluated prior to induction Oxygen Delivery Method: Circle System Utilized Preoxygenation: Pre-oxygenation with 100% oxygen Induction Type: IV induction Ventilation: Mask ventilation without difficulty and Oral airway inserted - appropriate to patient size Laryngoscope Size: Glidescope and 4 Grade View: Grade I Tube type: Oral Tube size: 7.0 mm Number of attempts: 1 Airway Equipment and Method: Stylet,  Oral airway and Video-laryngoscopy Placement Confirmation: ETT inserted through vocal cords under direct vision,  positive ETCO2 and breath sounds checked- equal and bilateral Secured at: 23 cm Tube secured with: Tape Dental Injury: Teeth and Oropharynx as per pre-operative assessment  Comments: Performed by Domingo Madeira, SRNA.

## 2020-03-13 NOTE — Anesthesia Preprocedure Evaluation (Signed)
Anesthesia Evaluation  Patient identified by MRN, date of birth, ID band Patient awake    Reviewed: Allergy & Precautions, NPO status , Patient's Chart, lab work & pertinent test results  Airway Mallampati: III  TM Distance: >3 FB Neck ROM: Full    Dental  (+) Teeth Intact, Dental Advisory Given Lower front bridge:   Pulmonary former smoker,    breath sounds clear to auscultation       Cardiovascular hypertension,  Rhythm:Regular Rate:Normal     Neuro/Psych    GI/Hepatic   Endo/Other    Renal/GU      Musculoskeletal   Abdominal   Peds  Hematology   Anesthesia Other Findings   Reproductive/Obstetrics                             Anesthesia Physical Anesthesia Plan  ASA: III  Anesthesia Plan: General   Post-op Pain Management:    Induction: Intravenous  PONV Risk Score and Plan: Ondansetron and Dexamethasone  Airway Management Planned: Oral ETT and Video Laryngoscope Planned  Additional Equipment:   Intra-op Plan:   Post-operative Plan: Extubation in OR  Informed Consent: I have reviewed the patients History and Physical, chart, labs and discussed the procedure including the risks, benefits and alternatives for the proposed anesthesia with the patient or authorized representative who has indicated his/her understanding and acceptance.     Dental advisory given  Plan Discussed with: CRNA and Anesthesiologist  Anesthesia Plan Comments:         Anesthesia Quick Evaluation

## 2020-03-13 NOTE — Anesthesia Postprocedure Evaluation (Signed)
Anesthesia Post Note  Patient: Jerry Cook  Procedure(s) Performed: Direct LARYNGOSCOPY WITH BX  AND ESPHOGOSCOPY (N/A Throat)     Patient location during evaluation: PACU Anesthesia Type: General Level of consciousness: awake and alert Pain management: pain level controlled Vital Signs Assessment: post-procedure vital signs reviewed and stable Respiratory status: spontaneous breathing, nonlabored ventilation, respiratory function stable and patient connected to nasal cannula oxygen Cardiovascular status: blood pressure returned to baseline and stable Postop Assessment: no apparent nausea or vomiting Anesthetic complications: no   No complications documented.  Last Vitals:  Vitals:   03/13/20 1415 03/13/20 1430  BP: (!) 109/58 110/65  Pulse: 61 (!) 50  Resp: 11 13  Temp:    SpO2: 93% 93%    Last Pain:  Vitals:   03/13/20 1430  TempSrc:   PainSc: 0-No pain                 [name]

## 2020-03-13 NOTE — Transfer of Care (Signed)
Immediate Anesthesia Transfer of Care Note  Patient: Jerry Cook  Procedure(s) Performed: Direct LARYNGOSCOPY WITH BX  AND ESPHOGOSCOPY (N/A Throat)  Patient Location: PACU  Anesthesia Type:General  Level of Consciousness: awake and alert   Airway & Oxygen Therapy: Patient Spontanous Breathing and Patient connected to face mask oxygen  Post-op Assessment: Report given to RN and Post -op Vital signs reviewed and stable  Post vital signs: Reviewed and stable  Last Vitals:  Vitals Value Taken Time  BP 119/53 03/13/20 1346  Temp    Pulse 59 03/13/20 1347  Resp 13 03/13/20 1347  SpO2 96 % 03/13/20 1347  Vitals shown include unvalidated device data.  Last Pain:  Vitals:   03/13/20 1116  TempSrc:   PainSc: 0-No pain         Complications: No complications documented.

## 2020-03-13 NOTE — Op Note (Signed)
Preop diagnosis: Tongue base cancer Postop diagnosis: same Procedure: Direct laryngoscopy with biopsy, rigid esophagoscopy Surgeon: Redmond Baseman Anesth: General endotracheal anesthesia Compl: None Indications: The patient is a 69 year old male with a history of hemoptysis and found to have a tongue base mass with right neck mass.  He presents for surgical evaluation and biopsy. Findings: Large granular mass of right tongue base down into vallecula and to lower right tonsil fossa. Description:  After discussing risks, benefits, and alternatives, the patient was brought to the operating room and placed on the operative table in the supine position.  Anesthesia was induced and the patient was intubated by the anesthesia team without difficulty.  The eyes were taped closed and the bed was turned 90 degrees from anesthesia.  A tooth guard was placed over the upper teeth.  A Storz laryngoscope was inserted and used to view the various areas of the pharynx and larynx including the endolarynx, post-cricoid area, pyriform sinuses, and vallecula.  Findings are noted above.  Biopsies were taken from the right tongue base using cup forceps.  After completion, the laryngoscope was removed and a rigid esophagoscope was inserted and passed down the esophagus keeping the lumen in view down to the mid esophagus.  The scope was then slowly backed out while evaluating the esophagus.  After completion, the esophagoscope was removed.  The patient was then turned back to anesthesia for wake up and was extubated and moved to the recovery room in stable condition.

## 2020-03-14 ENCOUNTER — Encounter (HOSPITAL_COMMUNITY): Payer: Self-pay | Admitting: Otolaryngology

## 2020-03-17 ENCOUNTER — Other Ambulatory Visit: Payer: Self-pay | Admitting: Otolaryngology

## 2020-03-17 ENCOUNTER — Other Ambulatory Visit (HOSPITAL_COMMUNITY): Payer: Self-pay | Admitting: Otolaryngology

## 2020-03-17 DIAGNOSIS — C01 Malignant neoplasm of base of tongue: Secondary | ICD-10-CM

## 2020-03-17 LAB — SURGICAL PATHOLOGY

## 2020-03-19 ENCOUNTER — Telehealth: Payer: Self-pay | Admitting: Hematology and Oncology

## 2020-03-19 NOTE — Telephone Encounter (Signed)
Received a new pt referral from Dr. Redmond Baseman for cancer of the tongue. Jerry Cook has been scheduled to see Dr. Chryl Heck on 3/9 at 1120am. I cld and lft the appt date and time on the pt's vm.

## 2020-03-25 ENCOUNTER — Inpatient Hospital Stay: Payer: Medicare HMO | Attending: Hematology and Oncology | Admitting: Hematology and Oncology

## 2020-03-25 ENCOUNTER — Other Ambulatory Visit: Payer: Self-pay

## 2020-03-25 ENCOUNTER — Encounter: Payer: Self-pay | Admitting: Hematology and Oncology

## 2020-03-25 ENCOUNTER — Inpatient Hospital Stay: Payer: Medicare HMO

## 2020-03-25 VITALS — BP 106/56 | HR 72 | Temp 97.2°F | Resp 18 | Ht 68.0 in | Wt 156.1 lb

## 2020-03-25 DIAGNOSIS — R634 Abnormal weight loss: Secondary | ICD-10-CM | POA: Diagnosis not present

## 2020-03-25 DIAGNOSIS — K219 Gastro-esophageal reflux disease without esophagitis: Secondary | ICD-10-CM | POA: Diagnosis not present

## 2020-03-25 DIAGNOSIS — R413 Other amnesia: Secondary | ICD-10-CM | POA: Diagnosis not present

## 2020-03-25 DIAGNOSIS — J320 Chronic maxillary sinusitis: Secondary | ICD-10-CM | POA: Diagnosis not present

## 2020-03-25 DIAGNOSIS — E785 Hyperlipidemia, unspecified: Secondary | ICD-10-CM | POA: Diagnosis not present

## 2020-03-25 DIAGNOSIS — I2510 Atherosclerotic heart disease of native coronary artery without angina pectoris: Secondary | ICD-10-CM | POA: Diagnosis not present

## 2020-03-25 DIAGNOSIS — Z87891 Personal history of nicotine dependence: Secondary | ICD-10-CM | POA: Insufficient documentation

## 2020-03-25 DIAGNOSIS — K148 Other diseases of tongue: Secondary | ICD-10-CM

## 2020-03-25 DIAGNOSIS — Z79899 Other long term (current) drug therapy: Secondary | ICD-10-CM | POA: Insufficient documentation

## 2020-03-25 DIAGNOSIS — M4312 Spondylolisthesis, cervical region: Secondary | ICD-10-CM | POA: Insufficient documentation

## 2020-03-25 DIAGNOSIS — C01 Malignant neoplasm of base of tongue: Secondary | ICD-10-CM | POA: Diagnosis not present

## 2020-03-25 DIAGNOSIS — I119 Hypertensive heart disease without heart failure: Secondary | ICD-10-CM | POA: Insufficient documentation

## 2020-03-25 DIAGNOSIS — M47814 Spondylosis without myelopathy or radiculopathy, thoracic region: Secondary | ICD-10-CM | POA: Diagnosis not present

## 2020-03-25 LAB — CMP (CANCER CENTER ONLY)
ALT: 18 U/L (ref 0–44)
AST: 20 U/L (ref 15–41)
Albumin: 3.9 g/dL (ref 3.5–5.0)
Alkaline Phosphatase: 75 U/L (ref 38–126)
Anion gap: 9 (ref 5–15)
BUN: 19 mg/dL (ref 8–23)
CO2: 32 mmol/L (ref 22–32)
Calcium: 9.6 mg/dL (ref 8.9–10.3)
Chloride: 98 mmol/L (ref 98–111)
Creatinine: 1.71 mg/dL — ABNORMAL HIGH (ref 0.61–1.24)
GFR, Estimated: 42 mL/min — ABNORMAL LOW (ref >60)
Glucose, Bld: 102 mg/dL — ABNORMAL HIGH (ref 70–99)
Potassium: 3.9 mmol/L (ref 3.5–5.1)
Sodium: 139 mmol/L (ref 135–145)
Total Bilirubin: 0.7 mg/dL (ref 0.3–1.2)
Total Protein: 7.1 g/dL (ref 6.5–8.1)

## 2020-03-25 LAB — CBC WITH DIFFERENTIAL/PLATELET
Abs Immature Granulocytes: 0.04 10*3/uL (ref 0.00–0.07)
Basophils Absolute: 0.1 10*3/uL (ref 0.0–0.1)
Basophils Relative: 1 %
Eosinophils Absolute: 0.3 10*3/uL (ref 0.0–0.5)
Eosinophils Relative: 3 %
HCT: 41.8 % (ref 39.0–52.0)
Hemoglobin: 13.9 g/dL (ref 13.0–17.0)
Immature Granulocytes: 0 %
Lymphocytes Relative: 12 %
Lymphs Abs: 1.2 10*3/uL (ref 0.7–4.0)
MCH: 29.6 pg (ref 26.0–34.0)
MCHC: 33.3 g/dL (ref 30.0–36.0)
MCV: 88.9 fL (ref 80.0–100.0)
Monocytes Absolute: 1.1 10*3/uL — ABNORMAL HIGH (ref 0.1–1.0)
Monocytes Relative: 11 %
Neutro Abs: 7.3 10*3/uL (ref 1.7–7.7)
Neutrophils Relative %: 73 %
Platelets: 218 10*3/uL (ref 150–400)
RBC: 4.7 MIL/uL (ref 4.22–5.81)
RDW: 12.3 % (ref 11.5–15.5)
WBC: 9.9 10*3/uL (ref 4.0–10.5)
nRBC: 0 % (ref 0.0–0.2)

## 2020-03-25 NOTE — Progress Notes (Signed)
[place] NOTE  Patient Care Team: Shirline Frees, MD as PCP - General (Family Medicine)  CHIEF COMPLAINTS/PURPOSE OF CONSULTATION:  New diagnosis of squamous cell carcinoma of the base of the tongue  ASSESSMENT & PLAN:  No problem-specific Assessment & Plan notes found for this encounter.  Orders Placed This Encounter  Procedures  . CBC with Differential/Platelet    Standing Status:   Standing    Number of Occurrences:   22    Standing Expiration Date:   03/25/2021  . CMP ([place] only)    Standing Status:   Future    Standing Expiration Date:   03/25/2021   1.  Squamous cell carcinoma of the base of the tongue, p16 positive. T3N2( based on CT imaging although clinically it appears close to 6 cms)Mx I agree with PET/CT to complete systemic staging.  If he does not have any evidence of metastatic disease, ideal treatment would be concurrent systemic therapy with radiation but given his borderline PS, lack of family support or social support, I am not quite sure if he cannot tolerate chemotherapy and radiation together. He was barely able to get up on the table without help on today's exam.  I did explain to him that CRT would be his best hope at curing his cancer in the absence of systemic disease but given his PS and risks versus benefits, we may have to also consider radiation therapy alone which I agree is not exactly curative in intent but may be in his best interest.  If he does have evidence of metastatic disease then we will proceed with PD-L1 testing and decide between chemo versus chemoimmunotherapy or or immunotherapy alone with the major dose modifications for disease control.  He appears to have a decent understanding of my recommendations. We have discussed briefly about chemotherapy and adverse effects including but not limited to fatigue, nausea, vomiting, increased risk of infections, nephrotoxicity, ototoxicity etc. 2.  Hypertension well  controlled, no concerns today. 3, rapid weight loss, recommend referral to nutrition for assistance.   He will return to clinic in 2 weeks after PET/CT to discuss final recommendations. Thank you for consulting [place] in the care of this patient.  Please not hesitate to contact us with any additional questions or concerns.  HISTORY OF PRESENTING ILLNESS:  Jerry Cook 70 y.o. male is here because of new diagnosis of SCC of BOT  Chronology  This is a 70 year old male patient who was admitted to the hospital in February 2022 with chief complaint of hemoptysis and rectal bleeding.  He was noted to have a right neck mass for the past few weeks prior to presentation.  He had CT imaging which showed a 2.5 x 1.9 x 3 cm right base of the tongue and glossotonsillar sulcus mass extending to the region of the right vallecula highly suspicious for squamous cell carcinoma primary tumor.  Extensive bilateral cystic/necrotic nodal metastatic disease at the right level 1-3 and 4 stations and left level 2 station.  Several lymph nodes demonstrate ill-defined margins highly suspicious for extracapsular extension.  Lymphadenopathy significantly narrows the upper internal jugular veins bilaterally  He then had direct laryngoscopy with biopsy, rigid esophagoscopy by Dr. Redmond Baseman on 03/13/2020 which showed large granular mass of right tongue base down into the vallecula and the lower right tonsillar fossa, biopsies were taken from the right tongue base using cup forceps  Pathology from tongue base biopsy showed squamous cell carcinoma, p16 testing strongly and diffusely  positive  He is scheduled for systemic imaging to complete staging He is here for an initial visit by himself.  And nurse navigator Anderson Malta was also here during his appointment.  He tells me that he has probably noticed this neck mass for about a month prior to his hospitalization in February.  He does report some discomfort on swallowing.  He has been  eating less portions but he has been able to eat all textures.  He lost about 20 pounds or so in the past 2 weeks.  He denies any pain except after the tongue biopsy.  No otalgia. No change in breathing, bowel habits or urinary habits.  He has well-controlled hypertension he lives alone with his dog, has no family members or friends to support except for 1 person who can occasionally help him if needed.  He is not physically active, cannot think of anything physically active that he is done recently.  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  He quit smoking back in the 1980s.  Rest of the pertinent 10 point ROS reviewed and negative.  REVIEW OF SYSTEMS:   Constitutional: Denies fevers, chills or abnormal night sweats Eyes: Denies blurriness of vision, double vision or watery eyes Ears, nose, mouth, throat, and face: As mentioned above. Respiratory: Denies cough, dyspnea or wheezes Cardiovascular: Denies palpitation, chest discomfort or lower extremity swelling Gastrointestinal:  Denies nausea, heartburn or change in bowel habits Skin: Denies abnormal skin rashes Lymphatics: Denies new lymphadenopathy or easy bruising Neurological:Denies numbness, tingling or new weaknesses Behavioral/Psych: Mood is stable, no new changes  All other systems were reviewed with the patient and are negative.  MEDICAL HISTORY:  Past Medical History:  Diagnosis Date  . GERD (gastroesophageal reflux disease)   . Hyperlipidemia   . Hypertension     SURGICAL HISTORY: Past Surgical History:  Procedure Laterality Date  . EYE SURGERY Bilateral    cataract  . LARYNGOSCOPY AND BRONCHOSCOPY N/A 03/13/2020   Procedure: Direct LARYNGOSCOPY WITH BX  AND ESPHOGOSCOPY;  Surgeon: Melida Quitter, MD;  Location: [facility];  Service: ENT;  Laterality: N/A;  . TONSILLECTOMY      SOCIAL HISTORY: Social History   Socioeconomic History  . Marital status: Single    Spouse name: Not on file  . Number of children: Not on file  . Years of  education: Not on file  . Highest education level: Not on file  Occupational History  . Not on file  Tobacco Use  . Smoking status: Former Smoker    Years: 13.00    Quit date: 03/24/1979    Years since quitting: 41.0  . Smokeless tobacco: Never Used  . Tobacco comment: Quit in 1981  Vaping Use  . Vaping Use: Never used  Substance and Sexual Activity  . Alcohol use: Yes    Alcohol/week: 1.0 standard drink    Types: 1 Cans of beer per week    Comment: Weekly  . Drug use: Not on file  . Sexual activity: Not on file  Other Topics Concern  . Not on file  Social History Narrative  . Not on file   Social Determinants of Health   Financial Resource Strain: Not on file  Food Insecurity: Not on file  Transportation Needs: Not on file  Physical Activity: Not on file  Stress: Not on file  Social Connections: Not on file  Intimate Partner Violence: Not on file    FAMILY HISTORY: History reviewed. No pertinent family history.  ALLERGIES:  has No Known Allergies.  MEDICATIONS:  Current Outpatient Medications  Medication Sig Dispense Refill  . atorvastatin (LIPITOR) 10 MG tablet Take 10 mg by mouth at bedtime.    . cetirizine (ZYRTEC) 10 MG tablet Take 10 mg by mouth daily.    . clonazePAM (KLONOPIN) 2 MG tablet Take 2 mg by mouth at bedtime.    . finasteride (PROSCAR) 5 MG tablet Take 5 mg by mouth daily.    . metoprolol tartrate (LOPRESSOR) 50 MG tablet Take 50 mg by mouth 2 (two) times daily.    [name] omeprazole (PRILOSEC) 20 MG capsule Take 20 mg by mouth daily.    [name] PARoxetine (PAXIL) 20 MG tablet Take 20 mg by mouth daily.    . tamsulosin (FLOMAX) 0.4 MG CAPS capsule Take 0.4 mg by mouth at bedtime.    . traZODone (DESYREL) 100 MG tablet Take 100 mg by mouth at bedtime.    . triamterene-hydrochlorothiazide (DYAZIDE) 37.5-25 MG capsule Take 1 capsule by mouth daily.    . fluticasone (FLONASE) 50 MCG/ACT nasal spray Place 2 sprays into both nostrils as needed for allergies.  (Patient not taking: Reported on 03/25/2020)    . sildenafil (REVATIO) 20 MG tablet Take 100 mg by mouth as needed (erectile dysfunction). (Patient not taking: Reported on 03/25/2020)     No current facility-administered medications for this visit.     PHYSICAL EXAMINATION:  ECOG PERFORMANCE STATUS: 2 - Symptomatic, <50% confined to bed  Vitals:   03/25/20 1130  BP: (!) 106/56  Pulse: 72  Resp: 18  Temp: (!) 97.2 F (36.2 C)  SpO2: 99%   Filed Weights   03/25/20 1130  Weight: 156 lb 1.6 oz (70.8 kg)    GENERAL:alert, no distress and comfortable SKIN: skin color, texture, turgor are normal, no rashes or significant lesions EYES: normal, conjunctiva are pink and non-injected, sclera clear OROPHARYNX:no exudate, no erythema and lips, buccal mucosa, and tongue normal  NECK: supple, thyroid normal size, non-tender, without nodularity LYMPH:  no palpable lymphadenopathy in the cervical, axillary or inguinal LUNGS: clear to auscultation and percussion with normal breathing effort HEART: regular rate & rhythm and no murmurs and no lower extremity edema ABDOMEN:abdomen soft, non-tender and normal bowel sounds Musculoskeletal:no cyanosis of digits and no clubbing  PSYCH: alert & oriented x 3 with fluent speech NEURO: no focal motor/sensory deficits  LABORATORY DATA:  I have reviewed the data as listed Lab Results  Component Value Date   WBC 7.3 03/13/2020   HGB 13.2 03/13/2020   HCT 39.6 03/13/2020   MCV 90.2 03/13/2020   PLT 196 03/13/2020     Chemistry      Component Value Date/Time   NA 137 03/13/2020 1106   K 4.3 03/13/2020 1106   CL 101 03/13/2020 1106   CO2 26 03/13/2020 1106   BUN 12 03/13/2020 1106   CREATININE 1.21 03/13/2020 1106      Component Value Date/Time   CALCIUM 9.2 03/13/2020 1106   ALKPHOS 59 03/03/2020 1303   AST 27 03/03/2020 1303   ALT 32 03/03/2020 1303   BILITOT 0.5 03/03/2020 1303       RADIOGRAPHIC STUDIES: I have personally reviewed  the radiological images as listed and agreed with the findings in the report. DG Chest 2 View  Result Date: 03/03/2020 CLINICAL DATA:  Hemoptysis. EXAM: CHEST - 2 VIEW COMPARISON:  01/14/2013. FINDINGS: Mediastinum and hilar structures normal. Heart size normal. No focal infiltrate. No pleural effusion or pneumothorax. Degenerative change thoracic spine. IMPRESSION: No acute cardiopulmonary  disease. Electronically Signed   By: Marcello Moores  Register   On: 03/03/2020 15:14   CT Soft Tissue Neck W Contrast  Result Date: 03/03/2020 CLINICAL DATA:  Lymphadenopathy, neck. Enlarged lymph nodes, shortness of breath, hemoptysis. EXAM: CT NECK WITH CONTRAST TECHNIQUE: Multidetector CT imaging of the neck was performed using the standard protocol following the bolus administration of intravenous contrast. CONTRAST:  72m OMNIPAQUE IOHEXOL 300 MG/ML  SOLN COMPARISON:  CT of the paranasal sinuses 04/26/2006. FINDINGS: Pharynx and larynx: Prominent streak and beam hardening artifact arising from dental restoration obscures portions of the oral cavity and pharynx. Asymmetric soft tissue within the right base of tongue and glossotonsillar sulcus regions, extending to the right vallecula and measuring 2.5 x 1.9 x 3.0 cm, likely reflecting a base of tongue squamous cell carcinoma (for instance as seen on series 3, image 31). Salivary glands: There is an ill-defined fat pain between right level 2 cystic/necrotic lymphadenopathy and portions of the right parotid and submandibular glands. The parotid and submandibular glands are otherwise unremarkable. Thyroid: Unremarkable. Lymph nodes: Extensive cystic/necrotic cervical lymphadenopathy at the right level 1, 2, 3 and 4 stations and at the left level 2 station. Cystic/necrotic right level 1 lymph nodes measure up to 2.3 cm in short axis (series 3, image 41). Multiple enlarged cystic/necrotic right level 2 lymph nodes, including a dominant right level 2 nodal conglomerate measuring 4.9  x 4.6 cm in transaxial dimensions (series 3, image 29). This nodal conglomerate has ill-defined margins with the adjacent right sternocleidomastoid muscle, right parotid gland and right submandibular gland and findings are highly suspicious for extracapsular extension. The largest right level 3 lymph node is cystic/necrotic and measures 1.4 cm in short axis (series 3, image 58). An enlarged cystic/necrotic right level 3/4 lymph node measures 1.7 cm in short axis (series 3, image 66). Multiple cystic/necrotic left level 2 lymph nodes measuring up to 2.3 cm in short axis (series 3, images 29 and 36). An enlarged cystic/necrotic left level 2 lymph node has an ill-defined margin with the adjacent left sternocleidomastoid muscle and findings are suspicious for extracapsular extension. Vascular: Cystic/necrotic lymphadenopathy near completely effaces the upper right internal jugular vein. Cystic/necrotic lymphadenopathy also significantly narrows the upper left internal jugular vein. Limited intracranial: No acute intracranial abnormality identified at the imaged levels. Visualized orbits: Excluded from the field of view. Mastoids and visualized paranasal sinuses: Incompletely imaged sequela of prior maxillofacial reconstructive surgery. Moderate mucosal thickening and possible fluid within the imaged right maxillary sinus. Mild mucosal thickening within the imaged left maxillary sinus. No significant mastoid effusion. Skeleton: No acute bony abnormality or aggressive osseous lesion. C4-C5 grade 1 anterolisthesis. Cervical spondylosis. Most notably, there is advanced disc space narrowing and degenerative endplate sclerosis with a disc bulge, endplate spurring and uncovertebral hypertrophy at C5-C6. Bilateral bony neural foraminal narrowing and apparent mild/moderate spinal canal stenosis at this level. Upper chest: No consolidation within the imaged lung apices. These results were called by telephone at the time of  interpretation on 03/03/2020 at 4:54 pm to provider Dr. KTomi Bamberger who verbally acknowledged these results. IMPRESSION: 2.5 x 1.9 x 3.0 cm right base of tongue and glossotonsillar sulcus mass extending to the region of the right vallecula, highly suspicious for a squamous cell carcinoma primary tumor. ENT consultation and direct visualization recommended. Extensive bilateral cystic/necrotic nodal metastatic disease at the right level 1, 2, 3 and 4 stations and left level 2 station. Several lymph nodes demonstrate ill-defined margins highly suspicious for extracapsular extension. Lymphadenopathy significantly narrows  the upper internal jugular veins bilaterally. Paranasal sinus disease as described. Electronically Signed   By: Kellie Simmering DO   On: 03/03/2020 16:55    SURGICAL PATHOLOGY   CASE: [id number]  PATIENT: Hermine Messick  Surgical Pathology Report    Reason for Addendum #1: Immunohistochemistry results   Clinical History: tongue base cancer with cervical metastases (cm)      FINAL MICROSCOPIC DIAGNOSIS:   A. TONGUE, RIGHT BASE MASS, BIOPSY:  - Squamous cell carcinoma.  - See comment.   COMMENT:  Immunohistochemistry will be performed and reported as an addendum.   Dr. Tresa Moore agrees.   Called to Dr. Redmond Baseman' office on 03/16/2020.  P16 positive  All questions were answered. The patient knows to call the clinic with any problems, questions or concerns. I spent 60 minutes in the care of this patient including H and P, review of records, counseling and coordination of care.     Benay Maricle, MD 03/25/2020 12:37 PM

## 2020-03-25 NOTE — Progress Notes (Signed)
Oncology Nurse Navigator Documentation  Met with patient during initial consult with Dr. Chryl Heck.    Introduced myself as his Navigator, explained my role as a member of the Care Team. . Provided New Patient Information packet: o Contact information for physician, this navigator, other members of the Care Team o Advance Directive information ([place] blue pamphlet with LCSW insert); provided [facility] AD booklet at his request,  o Fall Prevention Patient [place] sheet o Symptom Management Clinic information o [facility] campus map with highlight of [place] o SLP Information sheet . Provided and discussed educational handouts for PEG and PAC. [name] Assisted with post-consult appt scheduling. [name] He verbalized understanding of information provided. I encouraged him to call with questions/concerns moving forward.   Navigator Initial Assessment . Employment Status: he is retired.  . Currently on FMLA / STD:no . Living Situation:He lives alone.  . Support System: none . PCP: Shirline Frees MD . PCD: Dr. Otila Kluver. I explained to him that he would be contacted by a dentist who works with patients receiving radiation for evaluation before beginning treatment.  . Financial Concerns:no . Transportation Needs: no . Sensory Deficits:no . Language Barriers/Interpreter Needed:  no . Ambulation Needs: no . DME Used in Home: no . Psychosocial Needs:  no . Concerns/Needs Understanding Cancer:  addressed/answered by navigator to best of ability . Self-Expressed Needs: no   [name] RN, BSN, OCN Head & Neck Oncology Nurse [place] at [facility] Phone # [phone]  Fax # [phone]

## 2020-03-27 ENCOUNTER — Telehealth (HOSPITAL_COMMUNITY): Payer: Self-pay

## 2020-03-27 NOTE — Telephone Encounter (Signed)
I called and left message on patients answering machine to return call to Dental Medicine to schedule a New Outpatient Consult.

## 2020-04-01 ENCOUNTER — Ambulatory Visit (HOSPITAL_COMMUNITY): Payer: Medicare HMO

## 2020-04-01 ENCOUNTER — Telehealth: Payer: Self-pay | Admitting: Hematology and Oncology

## 2020-04-01 NOTE — Telephone Encounter (Signed)
R/s appointment per 3/16 inbasket msg. Called patient, no answer. Left message with appointment date and time.

## 2020-04-06 NOTE — Progress Notes (Signed)
Head and Neck Cancer Location of Tumor / Histology:  Squamous cell carcinoma of the base of the tongue, p16(+)  Patient presented ~1 month ago with symptoms of: Went to ED on 03/03/2020 for cough, rectal bleeding, and vomiting blood. Patient told ED provider that for 3-4 weeks he had blood in sputum, 1-2 episodes of bloody vomiting daily, and dark stools with blood. Reported the mass on the right side of the neck developed about 2 years ago and gradually became bigger.   CT Neck w/ Contrast 03/03/2020 IMPRESSION --2.5 x 1.9 x 3.0 cm right base of tongue and glossotonsillar sulcus mass extending to the region of the right vallecula, highly suspicious for a squamous cell carcinoma primary tumor. ENT consultation and direct visualization recommended. --Extensive bilateral cystic/necrotic nodal metastatic disease at the right level 1, 2, 3 and 4 stations and left level 2 station. Several lymph nodes demonstrate ill-defined margins highly suspicious for extracapsular extension. --Lymphadenopathy significantly narrows the upper internal jugular veins bilaterally.  Biopsies revealed:  03/13/2020 FINAL MICROSCOPIC DIAGNOSIS:  A. TONGUE, RIGHT BASE MASS, BIOPSY:  - Squamous cell carcinoma.  - See comment.  COMMENT:  Immunohistochemistry will be performed and reported as an addendum ADDENDUM: Immunohistochemistry for p16 is strongly and diffusely positive  Nutrition Status Yes No Comments  Weight changes? []  [x]    Swallowing concerns? []  [x]  Patient denies; states he had some issues after biopsy, but problems have resolved now and he is back to his regular diet  PEG? []  [x]     Referrals Yes No Comments  Social Work? [x]  []    Dentistry? [x]  []  Waiting to be scheduled  Swallowing therapy? [x]  []    Nutrition? [x]  []    Med/Onc? [x]  []  Dr. Arletha Pili Iruku   Safety Issues Yes No Comments  Prior radiation? []  [x]    Pacemaker/ICD? []  [x]    Possible current pregnancy? []  [x]  N/A  Is the patient on  methotrexate? []  [x]     Tobacco/Marijuana/Snuff/ETOH use: Former smoker (quit 03/24/1979); Denies any current alcohol consumption and has never used recreational drugs  Past/Anticipated interventions by otolaryngology, if any: 03/13/2020 Dr. Melida Quitter Direct laryngoscopy with biopsy, rigid esophagoscopy  Past/Anticipated interventions by medical oncology, if any:  Under care of Dr. Arletha Pili Iruku 03/25/2020 --I agree with PET/CT to complete systemic staging.   --If he does not have any evidence of metastatic disease, ideal treatment would be concurrent systemic therapy with radiation but given his borderline PS, lack of family support or social support, I am not quite sure if he cannot tolerate chemotherapy and radiation together. He was barely able to get up on the table without help on today's exam.   --I did explain to him that CRT would be his best hope at curing his cancer in the absence of systemic disease but given his PS and risks versus benefits, we may have to also consider radiation therapy alone which I agree is not exactly curative in intent but may be in his best interest.   --If he does have evidence of metastatic disease then we will proceed with PD-L1 testing and decide between chemo versus chemoimmunotherapy or or immunotherapy alone with the major dose modifications for disease control. --He appears to have a decent understanding of my recommendations. --We have discussed briefly about chemotherapy and adverse effects including but not limited to fatigue, nausea, vomiting, increased risk of infections, nephrotoxicity, ototoxicity etc.  Current Complaints / other details:   Scheduled for PET scan on 04/10/2020. Patient has received both Moderna vaccines as  well as Moderna booster

## 2020-04-07 ENCOUNTER — Inpatient Hospital Stay: Payer: Medicare HMO | Admitting: Hematology and Oncology

## 2020-04-07 ENCOUNTER — Ambulatory Visit
Admission: RE | Admit: 2020-04-07 | Discharge: 2020-04-07 | Disposition: A | Discharge status: Home / Self Care | Payer: Medicare HMO | Source: Ambulatory Visit | Attending: Radiation Oncology | Admitting: Radiation Oncology

## 2020-04-07 ENCOUNTER — Other Ambulatory Visit: Payer: Self-pay

## 2020-04-07 ENCOUNTER — Encounter: Payer: Self-pay | Admitting: Radiation Oncology

## 2020-04-07 VITALS — BP 92/56 | HR 56 | Resp 17 | Ht 70.0 in | Wt 158.1 lb

## 2020-04-07 DIAGNOSIS — I10 Essential (primary) hypertension: Secondary | ICD-10-CM | POA: Diagnosis not present

## 2020-04-07 DIAGNOSIS — R591 Generalized enlarged lymph nodes: Secondary | ICD-10-CM | POA: Diagnosis not present

## 2020-04-07 DIAGNOSIS — K219 Gastro-esophageal reflux disease without esophagitis: Secondary | ICD-10-CM | POA: Insufficient documentation

## 2020-04-07 DIAGNOSIS — Z79899 Other long term (current) drug therapy: Secondary | ICD-10-CM | POA: Diagnosis not present

## 2020-04-07 DIAGNOSIS — C01 Malignant neoplasm of base of tongue: Secondary | ICD-10-CM | POA: Diagnosis not present

## 2020-04-07 DIAGNOSIS — E785 Hyperlipidemia, unspecified: Secondary | ICD-10-CM | POA: Insufficient documentation

## 2020-04-07 DIAGNOSIS — Z87891 Personal history of nicotine dependence: Secondary | ICD-10-CM | POA: Diagnosis not present

## 2020-04-07 NOTE — Progress Notes (Signed)
Dental Form with Estimates of Radiation Dose      Diagnosis:    ICD-10-CM   1. Squamous cell carcinoma of base of tongue (HCC)  C01     Prognosis: Pending PET, tentatively curative.  Anticipated # of fractions: 35  Daily?: yes  # of weeks of radiotherapy: 7  Chemotherapy?:  Undetermined  Anticipated xerostomia:  Mild permanent   Pre-simulation needs:   Scatter protection    Simulation: ASAP following PET  Other Notes: One or two right mandibular molars may get above 50 Gray posteriorly.  Please contact Eppie Gibson, MD, with patient's disposition after evaluation and/or dental treatment.

## 2020-04-07 NOTE — Progress Notes (Signed)
Oncology Nurse Navigator Documentation  Met with patient during initial consult with Dr. [name].  . Further introduced myself as his Navigator, explained my role as a member of the Care Team. . Assisted with post-consult appt scheduling. Jerry Cook is aware that he needs to see Dr. Benson Norway before treatment can proceed. He has not returned the most recent calls from Dr. Raynelle Dick office to schedule an appointment. At [name] request I have contacted Dr. Raynelle Dick office and asked them to call [name] to get an appointment scheduled. He is also aware of his PET scan scheduled for 04/10/20. [name] He verbalized understanding of information provided. . I encouraged him to call with questions/concerns moving forward.  [name], RN, BSN, OCN Head & Neck Oncology Nurse [facility] [phone]

## 2020-04-07 NOTE — Progress Notes (Signed)
Radiation Oncology         ([phone]) [phone] ________________________________  Initial Outpatient Consultation  Name: Jerry Cook MRN: [id number]  Date: 04/07/2020  DOB: [birth year]  [id number], Gwyndolyn Saxon, MD  Melida Quitter, MD   REFERRING PHYSICIAN: Melida Quitter, MD  DIAGNOSIS:    ICD-10-CM   1. Squamous cell carcinoma of base of tongue (HCC)  C01    Cancer Staging Squamous cell carcinoma of base of tongue (HCC) Staging form: Pharynx - HPV-Mediated Oropharynx, AJCC 8th Edition - Clinical stage from 03/25/2020: Stage II (cT3, cN2, cM0, p16+) - Signed by Benay Raider, MD on 03/25/2020 Stage prefix: Initial diagnosis   CHIEF COMPLAINT: Here to discuss management of base of tongue tongue cancer  HISTORY OF PRESENT ILLNESS::Jerry Cook is a 70 y.o. male retired from [employer] who presented to the ED on 03/03/2020 with complaints of hemoptysis, hematemesis, and hematochezia.   Subsequently, the patient saw Dr. Redmond Baseman who recommended biopsy of the tongue base.  Direct laryngoscopy with biopsy and rigid esophagoscopy on 03/13/2020 revealed: squamous cell carcinoma.  p16 strongly and diffusely positive.  Pertinent imaging thus far includes soft tissue CT scan of neck performed on 03/03/2020 revealing a 2.5 x 1.9 x 3.0 cm right base of tongue and glossotonsillar sulcus mass extending to the region of the right vallecula, highly suspicious for a squamous cell carcinoma primary tumor. There was also noted to be extensive bilateral cystic/necrotic nodal metastatic disease at the right level 1, 2, 3, and 4 stations and left level 2 station. There were several lymph nodes demonstrating ill-defined margins, highly suspicious for extracapsular extension. Finally, there was lymphadenopathy significantly narrowing the upper internal jugular veins bilaterally.   Nutrition Status Yes No Comments  Weight changes? [x]  [x]  About 20lb  Swallowing concerns? []  [x]  Patient denies; states he had  some issues after biopsy, but problems have resolved now and he is back to his regular diet  PEG? []  [x]     Referrals Yes No Comments  Social Work? [x]  []    Dentistry? [x]  []  Waiting to be scheduled  Swallowing therapy? [x]  []    Nutrition? [x]  []    Med/Onc? [x]  []  Dr. Arletha Pili Iruku   Safety Issues Yes No Comments  Prior radiation? []  [x]    Pacemaker/ICD? []  [x]    Possible current pregnancy? []  [x]  N/A  Is the patient on methotrexate? []  [x]      Pain status: Denies  Other symptoms: Denies dizziness or lightheadedness  Tobacco history, if any: Distant history, see below  ETOH abuse, if any: Denies   He lives alone with his pet dog, Gizmo.  PET scan is pending.  Dentistry appointment is pending.  He has seen Dr Chryl Heck and is felt to be a suboptimal candidate for concurrent chemotherapy.  Will reassess after PET scan    PREVIOUS RADIATION THERAPY: No  PAST MEDICAL HISTORY:  has a past medical history of GERD (gastroesophageal reflux disease), Hyperlipidemia, and Hypertension.    PAST SURGICAL HISTORY: Past Surgical History:  Procedure Laterality Date  . EYE SURGERY Bilateral    cataract  . LARYNGOSCOPY AND BRONCHOSCOPY N/A 03/13/2020   Procedure: Direct LARYNGOSCOPY WITH BX  AND ESPHOGOSCOPY;  Surgeon: Melida Quitter, MD;  Location: [facility];  Service: ENT;  Laterality: N/A;  . TONSILLECTOMY      FAMILY HISTORY: family history is not on file.  SOCIAL HISTORY:  reports that he quit smoking about 41 years ago. He quit after 13.00 years of use. He has never used smokeless tobacco.  He reports previous alcohol use of about 1.0 standard drink of alcohol per week. He reports that he does not use drugs.  ALLERGIES: Patient has no known allergies.  MEDICATIONS:  Current Outpatient Medications  Medication Sig Dispense Refill  . atorvastatin (LIPITOR) 10 MG tablet Take 10 mg by mouth at bedtime.    . bifidobacterium infantis (ALIGN) capsule Take 1 capsule by mouth daily.    .  cetirizine (ZYRTEC) 10 MG tablet Take 10 mg by mouth daily.    . clonazePAM (KLONOPIN) 2 MG tablet Take 2 mg by mouth at bedtime.    . finasteride (PROSCAR) 5 MG tablet Take 5 mg by mouth daily.    . fluticasone (FLONASE) 50 MCG/ACT nasal spray Place 2 sprays into both nostrils as needed for allergies. (Patient not taking: Reported on 03/25/2020)    . metoprolol tartrate (LOPRESSOR) 50 MG tablet Take 50 mg by mouth 2 (two) times daily.    [name] omeprazole (PRILOSEC) 20 MG capsule Take 20 mg by mouth daily.    [name] PARoxetine (PAXIL) 20 MG tablet Take 20 mg by mouth daily.    . sildenafil (REVATIO) 20 MG tablet Take 100 mg by mouth as needed (erectile dysfunction). (Patient not taking: Reported on 03/25/2020)    . tamsulosin (FLOMAX) 0.4 MG CAPS capsule Take 0.4 mg by mouth at bedtime.    . traZODone (DESYREL) 100 MG tablet Take 100 mg by mouth at bedtime.    . triamterene-hydrochlorothiazide (DYAZIDE) 37.5-25 MG capsule Take 1 capsule by mouth daily.     No current facility-administered medications for this encounter.    REVIEW OF SYSTEMS:  Notable for that above.   PHYSICAL EXAM:  height is 5\' 10"  (1.778 m) and weight is 158 lb 2 oz (71.7 kg). His blood pressure is 92/56 (abnormal) and his pulse is 56 (abnormal). His respiration is 17 and oxygen saturation is 99%.   General: Alert and oriented, in no acute distress HEENT: Head is normocephalic. Extraocular movements are intact. Oropharynx is notable for no obvious lesions in the upper throat.  Anterior dental bridge, mandible.  Extensive metal dental work.  No trismus.  Tongue is midline. Neck: Neck is notable for palpable adenopathy in the bilateral level 2 regions as well as right level 1 and 3.  Dominant mass is at right level 2, approximately 5 cm Heart: Regular in rate and rhythm with no murmurs, rubs, or gallops. Chest: Clear to auscultation bilaterally, with no rhonchi, wheezes, or rales. Abdomen: Soft, nontender, nondistended, with no rigidity  or guarding. Lymphatics: see Neck Exam Skin: No concerning lesions over neck. Neurologic: Cranial nerves II through XII are grossly intact. No obvious focalities. Speech is fluent. Coordination is intact. Psychiatric: Judgment and insight are intact. Affect is appropriate.   ECOG = 1  0 - Asymptomatic (Fully active, able to carry on all predisease activities without restriction)  1 - Symptomatic but completely ambulatory (Restricted in physically strenuous activity but ambulatory and able to carry out work of a light or sedentary nature. For example, light housework, office work)  2 - Symptomatic, <50% in bed during the day (Ambulatory and capable of all self care but unable to carry out any work activities. Up and about more than 50% of waking hours)  3 - Symptomatic, >50% in bed, but not bedbound (Capable of only limited self-care, confined to bed or chair 50% or more of waking hours)  4 - Bedbound (Completely disabled. Cannot carry on any self-care. Totally confined to bed  or chair)  5 - Death   Eustace Pen MM, Creech RH, Tormey DC, et al. [phone]). "Toxicity and response criteria of the [facility]". [facility] Oncol. 5 (6): 649-55   LABORATORY DATA:  Lab Results  Component Value Date   WBC 9.9 03/25/2020   HGB 13.9 03/25/2020   HCT 41.8 03/25/2020   MCV 88.9 03/25/2020   PLT 218 03/25/2020   CMP     Component Value Date/Time   NA 139 03/25/2020 1238   K 3.9 03/25/2020 1238   CL 98 03/25/2020 1238   CO2 32 03/25/2020 1238   GLUCOSE 102 (H) 03/25/2020 1238   BUN 19 03/25/2020 1238   CREATININE 1.71 (H) 03/25/2020 1238   CALCIUM 9.6 03/25/2020 1238   PROT 7.1 03/25/2020 1238   ALBUMIN 3.9 03/25/2020 1238   AST 20 03/25/2020 1238   ALT 18 03/25/2020 1238   ALKPHOS 75 03/25/2020 1238   BILITOT 0.7 03/25/2020 1238   GFRNONAA 42 (L) 03/25/2020 1238      No results found for: TSH   RADIOGRAPHY: As above.  I personally reviewed his images.  PET is  pending.  IMPRESSION/PLAN:  This is a delightful patient with head and neck cancer. I recommend radiotherapy for this patient.  We discussed the potential risks, benefits, and side effects of radiotherapy. We talked in detail about acute and late effects. We discussed that some of the most bothersome acute effects may be mucositis, dysgeusia, salivary changes, skin irritation, hair loss, dehydration, weight loss and fatigue. We talked about late effects which include but are not necessarily limited to dysphagia, hypothyroidism, nerve injury, vascular injury, spinal cord injury, xerostomia, trismus, neck edema, and potential injury to any of the tissues in the head and neck region. No guarantees of treatment were given. A consent form was signed and placed in the patient's medical record. The patient is enthusiastic about proceeding with treatment. I look forward to participating in the patient's care.    Simulation (treatment planning) will take place assuming that PET does not show metastatic disease.  Treatment planning will not occur until he is cleared by dentistry   We also discussed that the treatment of head and neck cancer is a multidisciplinary process to maximize treatment outcomes and quality of life. For this reason the following referrals have been or will be made:   Medical oncology to discuss chemotherapy    Dentistry for dental evaluation, possible extractions in the radiation fields, and /or advice on reducing risk of cavities, osteoradionecrosis, or other oral issues.  Anticipate he will need scatter protection devices   Nutritionist for nutrition support during and after treatment.  He understands he will need to PEG tube if he undergoes concurrent chemotherapy.  Otherwise he will need to be very diligent about his nutrition and we may be able to avoid it with definitive radiation.   Speech language pathology for swallowing and/or speech therapy.   Social work for social  support.    Physical therapy due to risk of lymphedema in neck and deconditioning.   Baseline labs including TSH.  On date of service, in total, I spent 60 minutes on this encounter. Patient was seen in person.  __________________________________________   Eppie Gibson, MD  This document serves as a record of services personally performed by Eppie Gibson, MD. It was created on his behalf by Clerance Lav, a trained medical scribe. The creation of this record is based on the scribe's personal observations and the provider's  statements to them. This document has been checked and approved by the attending provider.  

## 2020-04-10 ENCOUNTER — Encounter (HOSPITAL_COMMUNITY)
Admission: RE | Admit: 2020-04-10 | Discharge: 2020-04-10 | Disposition: A | Discharge status: Home / Self Care | Payer: Medicare HMO | Source: Ambulatory Visit | Attending: Otolaryngology | Admitting: Otolaryngology

## 2020-04-10 ENCOUNTER — Other Ambulatory Visit: Payer: Self-pay

## 2020-04-10 DIAGNOSIS — N400 Enlarged prostate without lower urinary tract symptoms: Secondary | ICD-10-CM | POA: Insufficient documentation

## 2020-04-10 DIAGNOSIS — C01 Malignant neoplasm of base of tongue: Secondary | ICD-10-CM | POA: Diagnosis not present

## 2020-04-10 DIAGNOSIS — I2510 Atherosclerotic heart disease of native coronary artery without angina pectoris: Secondary | ICD-10-CM | POA: Insufficient documentation

## 2020-04-10 DIAGNOSIS — I517 Cardiomegaly: Secondary | ICD-10-CM | POA: Diagnosis not present

## 2020-04-10 DIAGNOSIS — I700 Atherosclerosis of aorta: Secondary | ICD-10-CM | POA: Diagnosis not present

## 2020-04-10 LAB — GLUCOSE, CAPILLARY: Glucose-Capillary: 112 mg/dL — ABNORMAL HIGH (ref 70–99)

## 2020-04-10 MED ORDER — FLUDEOXYGLUCOSE F - 18 (FDG) INJECTION: 7.0000 | Freq: Once | INTRAVENOUS | Status: DC | PRN

## 2020-04-14 ENCOUNTER — Ambulatory Visit (INDEPENDENT_AMBULATORY_CARE_PROVIDER_SITE_OTHER): Payer: Self-pay | Admitting: Dentistry

## 2020-04-14 ENCOUNTER — Encounter: Payer: Self-pay | Admitting: Hematology and Oncology

## 2020-04-14 ENCOUNTER — Other Ambulatory Visit: Payer: Self-pay

## 2020-04-14 ENCOUNTER — Inpatient Hospital Stay: Payer: Medicare HMO | Admitting: Hematology and Oncology

## 2020-04-14 VITALS — BP 112/50 | HR 46 | Temp 98.1°F

## 2020-04-14 VITALS — BP 117/43 | HR 63 | Temp 94.3°F | Resp 16 | Ht 70.0 in | Wt 152.0 lb

## 2020-04-14 DIAGNOSIS — K219 Gastro-esophageal reflux disease without esophagitis: Secondary | ICD-10-CM | POA: Diagnosis not present

## 2020-04-14 DIAGNOSIS — K032 Erosion of teeth: Secondary | ICD-10-CM

## 2020-04-14 DIAGNOSIS — J320 Chronic maxillary sinusitis: Secondary | ICD-10-CM | POA: Diagnosis not present

## 2020-04-14 DIAGNOSIS — K08109 Complete loss of teeth, unspecified cause, unspecified class: Secondary | ICD-10-CM

## 2020-04-14 DIAGNOSIS — I119 Hypertensive heart disease without heart failure: Secondary | ICD-10-CM | POA: Diagnosis not present

## 2020-04-14 DIAGNOSIS — R634 Abnormal weight loss: Secondary | ICD-10-CM | POA: Diagnosis not present

## 2020-04-14 DIAGNOSIS — Z87891 Personal history of nicotine dependence: Secondary | ICD-10-CM | POA: Diagnosis not present

## 2020-04-14 DIAGNOSIS — C01 Malignant neoplasm of base of tongue: Secondary | ICD-10-CM

## 2020-04-14 DIAGNOSIS — K0510 Chronic gingivitis, plaque induced: Secondary | ICD-10-CM

## 2020-04-14 DIAGNOSIS — E785 Hyperlipidemia, unspecified: Secondary | ICD-10-CM | POA: Diagnosis not present

## 2020-04-14 DIAGNOSIS — I2510 Atherosclerotic heart disease of native coronary artery without angina pectoris: Secondary | ICD-10-CM | POA: Diagnosis not present

## 2020-04-14 DIAGNOSIS — M264 Malocclusion, unspecified: Secondary | ICD-10-CM

## 2020-04-14 DIAGNOSIS — Z79899 Other long term (current) drug therapy: Secondary | ICD-10-CM | POA: Diagnosis not present

## 2020-04-14 DIAGNOSIS — R413 Other amnesia: Secondary | ICD-10-CM

## 2020-04-14 DIAGNOSIS — K06020 Generalized gingival recession, unspecified: Secondary | ICD-10-CM

## 2020-04-14 DIAGNOSIS — Z01818 Encounter for other preprocedural examination: Secondary | ICD-10-CM

## 2020-04-14 NOTE — Progress Notes (Signed)
[facility] NOTE  Patient Care Team: Shirline Frees, MD as PCP - General (Family Medicine) Eppie Gibson, MD as Consulting Physician (Radiation Oncology) Malmfelt, Stephani Police, RN as Oncology Nurse Navigator Benay Milbourne, MD as Consulting Physician (Hematology and Oncology)  CHIEF COMPLAINTS/PURPOSE OF CONSULTATION:  New diagnosis of squamous cell carcinoma of the base of the tongue  ASSESSMENT & PLAN:  No problem-specific Assessment & Plan notes found for this encounter.  No orders of the defined types were placed in this encounter.  1.  Squamous cell carcinoma of the base of the tongue, p16 positive. T3N2( based on CT imaging although clinically it appears close to 6 cms)Mx No definitive metastatic disease on imaging. He was barely able to get up on the table without help on today's exam. He spends most of his day watching TV, eats [employer] sub every day, doesn't fix his meals.   I did explain to him that CRT would be his best hope at curing his cancer in the absence of systemic disease but given his PS and risks versus benefits, we may have to consider radiation therapy alone which I agree is not exactly curative in intent but may be in his best interest. We have discussed briefly about chemotherapy and adverse effects including but not limited to fatigue, nausea, vomiting, increased risk of infections, nephrotoxicity, ototoxicity etc. He is agreeable to our recommendations  2.  Hypertension well controlled, no concerns today. 3.  Rapid weight loss, recommend referral to nutrition for assistance.  4. Memory loss, he had no memory of seeing Dr [name] last week. He says his memory is not the sharpest anymore. Concern for dementia?   He will follow up with medical oncology as needed. He has no symptoms or changes in bowel habits, so he could consider colonoscopy at a later time. His H and N cancer needs treatment asap.  Thank you for consulting [place] in the  care of this patient.  Please not hesitate to contact us with any additional questions or concerns.  HISTORY OF PRESENTING ILLNESS:  Jerry Cook 70 y.o. male is here because of new diagnosis of SCC of BOT  Chronology  This is a 70 year old male patient who was admitted to the hospital in February 2022 with chief complaint of hemoptysis and rectal bleeding.  He was noted to have a right neck mass for the past few weeks prior to presentation.  He had CT imaging which showed a 2.5 x 1.9 x 3 cm right base of the tongue and glossotonsillar sulcus mass extending to the region of the right vallecula highly suspicious for squamous cell carcinoma primary tumor.  Extensive bilateral cystic/necrotic nodal metastatic disease at the right level 1-3 and 4 stations and left level 2 station.  Several lymph nodes demonstrate ill-defined margins highly suspicious for extracapsular extension.  Lymphadenopathy significantly narrows the upper internal jugular veins bilaterally  He then had direct laryngoscopy with biopsy, rigid esophagoscopy by Dr. Redmond Baseman on 03/13/2020 which showed large granular mass of right tongue base down into the vallecula and the lower right tonsillar fossa, biopsies were taken from the right tongue base using cup forceps  Pathology from tongue base biopsy showed squamous cell carcinoma, p16 testing strongly and diffusely positive  He is scheduled for systemic imaging to complete staging He is here for an initial visit by himself.  And nurse navigator Anderson Malta was also here during his appointment.  He tells me that he has probably noticed this neck mass for  about a month prior to his hospitalization in February.  He does report some discomfort on swallowing.  He has been eating less portions but he has been able to eat all textures.  He lost about 20 pounds or so in the past 2 weeks.  He denies any pain except after the tongue biopsy.  No otalgia. No change in breathing, bowel habits or urinary  habits.  He has well-controlled hypertension he lives alone with his dog, has no family members or friends to support except for 1 person who can occasionally help him if needed.  He is not physically active, cannot think of anything physically active that he is done recently.  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  He quit smoking back in the 1980s.  Rest of the pertinent 10 point ROS reviewed and negative.  Interim History  He is here for a FU. He saw Dr [name] last week but absolutely has no memory of seeing her. He denies any pain He has been eating only one meal a day, lost 6 lbs from treatment. He denies any change in breathing, bowel habits or urinary habits. He spends most of his day watching TV, has no family or social support.   REVIEW OF SYSTEMS:   Constitutional: Denies fevers, chills or abnormal night sweats Eyes: Denies blurriness of vision, double vision or watery eyes Ears, nose, mouth, throat, and face: As mentioned above. Respiratory: Denies cough, dyspnea or wheezes Cardiovascular: Denies palpitation, chest discomfort or lower extremity swelling Gastrointestinal:  Denies nausea, heartburn or change in bowel habits Skin: Denies abnormal skin rashes Lymphatics: Denies new lymphadenopathy or easy bruising Neurological:Denies numbness, tingling or new weaknesses Behavioral/Psych: Mood is stable, no new changes  All other systems were reviewed with the patient and are negative.  MEDICAL HISTORY:  Past Medical History:  Diagnosis Date  . GERD (gastroesophageal reflux disease)   . Hyperlipidemia   . Hypertension     SURGICAL HISTORY: Past Surgical History:  Procedure Laterality Date  . EYE SURGERY Bilateral    cataract  . LARYNGOSCOPY AND BRONCHOSCOPY N/A 03/13/2020   Procedure: Direct LARYNGOSCOPY WITH BX  AND ESPHOGOSCOPY;  Surgeon: Melida Quitter, MD;  Location: [facility];  Service: ENT;  Laterality: N/A;  . TONSILLECTOMY      SOCIAL HISTORY: Social History    Socioeconomic History  . Marital status: Single    Spouse name: Not on file  . Number of children: Not on file  . Years of education: Not on file  . Highest education level: Not on file  Occupational History  . Not on file  Tobacco Use  . Smoking status: Former Smoker    Years: 13.00    Quit date: 03/24/1979    Years since quitting: 41.0  . Smokeless tobacco: Never Used  . Tobacco comment: Quit in 1981  Vaping Use  . Vaping Use: Never used  Substance and Sexual Activity  . Alcohol use: Not Currently    Alcohol/week: 1.0 standard drink    Types: 1 Cans of beer per week    Comment: Weekly  . Drug use: Never  . Sexual activity: Not Currently  Other Topics Concern  . Not on file  Social History Narrative  . Not on file   Social Determinants of Health   Financial Resource Strain: Not on file  Food Insecurity: Not on file  Transportation Needs: Not on file  Physical Activity: Not on file  Stress: Not on file  Social Connections: Not on file  Intimate Partner  Violence: Not on file    FAMILY HISTORY: No family history on file.  ALLERGIES:  has No Known Allergies.  MEDICATIONS:  Current Outpatient Medications  Medication Sig Dispense Refill  . atorvastatin (LIPITOR) 10 MG tablet Take 10 mg by mouth at bedtime.    . bifidobacterium infantis (ALIGN) capsule Take 1 capsule by mouth daily.    . cetirizine (ZYRTEC) 10 MG tablet Take 10 mg by mouth daily.    . clonazePAM (KLONOPIN) 2 MG tablet Take 2 mg by mouth at bedtime.    . finasteride (PROSCAR) 5 MG tablet Take 5 mg by mouth daily.    . fluticasone (FLONASE) 50 MCG/ACT nasal spray Place 2 sprays into both nostrils as needed for allergies. (Patient not taking: Reported on 03/25/2020)    . metoprolol tartrate (LOPRESSOR) 50 MG tablet Take 50 mg by mouth 2 (two) times daily.    [name] omeprazole (PRILOSEC) 20 MG capsule Take 20 mg by mouth daily.    [name] PARoxetine (PAXIL) 20 MG tablet Take 20 mg by mouth daily.    . sildenafil  (REVATIO) 20 MG tablet Take 100 mg by mouth as needed (erectile dysfunction). (Patient not taking: Reported on 03/25/2020)    . tamsulosin (FLOMAX) 0.4 MG CAPS capsule Take 0.4 mg by mouth at bedtime.    . traZODone (DESYREL) 100 MG tablet Take 100 mg by mouth at bedtime.    . triamterene-hydrochlorothiazide (DYAZIDE) 37.5-25 MG capsule Take 1 capsule by mouth daily.     No current facility-administered medications for this visit.   Facility-Administered Medications Ordered in Other Visits  Medication Dose Route Frequency Provider Last Rate Last Admin  . fludeoxyglucose F - 18 (FDG) injection 7 millicurie  7 millicurie Intravenous Once PRN Abigail Miyamoto, MD         PHYSICAL EXAMINATION:  ECOG PERFORMANCE STATUS: 2 - Symptomatic, <50% confined to bed  Vitals:   04/14/20 1437  BP: (!) 117/43  Pulse: 63  Resp: 16  Temp: (!) 94.3 F (34.6 C)  SpO2: 100%   Filed Weights   04/14/20 1437  Weight: 152 lb (68.9 kg)    GENERAL:alert, no distress and comfortable, needs assistance getting up the table, SKIN: skin color, texture, turgor are normal, no rashes or significant lesions EYES: normal, conjunctiva are pink and non-injected, sclera clear OROPHARYNX:no exudate, no erythema and lips, buccal mucosa, and tongue normal  NECK: supple, thyroid normal size, non-tender, without nodularity LYMPH:  Large bilateral palpable cervical LN. LUNGS: clear to auscultation and percussion with normal breathing effort HEART: regular rate & rhythm and no murmurs and no lower extremity edema ABDOMEN:abdomen soft, non-tender and normal bowel sounds Musculoskeletal:no cyanosis of digits and no clubbing  PSYCH: alert & oriented x 3 with fluent speech NEURO: no focal motor/sensory deficits  LABORATORY DATA:  I have reviewed the data as listed Lab Results  Component Value Date   WBC 9.9 03/25/2020   HGB 13.9 03/25/2020   HCT 41.8 03/25/2020   MCV 88.9 03/25/2020   PLT 218 03/25/2020     Chemistry       Component Value Date/Time   NA 139 03/25/2020 1238   K 3.9 03/25/2020 1238   CL 98 03/25/2020 1238   CO2 32 03/25/2020 1238   BUN 19 03/25/2020 1238   CREATININE 1.71 (H) 03/25/2020 1238      Component Value Date/Time   CALCIUM 9.6 03/25/2020 1238   ALKPHOS 75 03/25/2020 1238   AST 20 03/25/2020 1238   ALT 18  03/25/2020 1238   BILITOT 0.7 03/25/2020 1238       RADIOGRAPHIC STUDIES: I have personally reviewed the radiological images as listed and agreed with the findings in the report. NM PET Image Initial (PI) Skull Base To Thigh  Result Date: 04/10/2020 CLINICAL DATA:  Initial treatment strategy for tongue base cancer, biopsy on 03/13/2020. EXAM: NUCLEAR MEDICINE PET SKULL BASE TO THIGH TECHNIQUE: 8.6 mCi F-18 FDG was injected intravenously. Full-ring PET imaging was performed from the skull base to thigh after the radiotracer. CT data was obtained and used for attenuation correction and anatomic localization. Fasting blood glucose: 112 mg/dl COMPARISON:  CT neck from 03/03/2020 FINDINGS: Mediastinal blood pool activity: SUV max 2.2 Liver activity: SUV max NA NECK: A mass along the right tongue base and palatine tonsillar region is observed with maximum SUV 18.6. A centrally necrotic pathologic right level IIa lymph node has activity with a short axis diameter of 3.9 cm, and a maximum standard uptake value of 12.7 Right level IB lymph nodes are present with a posterior node measuring 1.6 cm in short axis on image 40 of series 4 with maximum SUV of 10.5, and a more anterior node measuring 1.8 cm in short axis on image 44 of series 4 with a maximum SUV of 10.3. A right level III lymph node measuring 1.5 cm in short axis on image 46 of series 4 has a maximum SUV of 17.6. A left level IIa lymph node measuring 1.8 cm in short axis on image 39 series 4 has maximum SUV of 11.8. Other adjacent smaller hypermetabolic left level II lymph nodes are present. There also several additional smaller  right level II and III lymph nodes A right level IV lymph node measuring 1.3 cm in short axis on image 51 of series 4 has maximum SUV of 14.3. Incidental CT findings: Chronic right maxillary sinusitis. Plate and screw fixators along the maxilla and mandible. CHEST: Right supraclavicular node measuring 0.7 cm in short axis on image 56 series 4, maximum SUV 5.9. No other hypermetabolic thoracic adenopathy. Incidental CT findings: Atherosclerotic calcification of the descending thoracic aorta, left main, left anterior descending, and right coronary arteries. Mild cardiomegaly. Mild biapical pleuroparenchymal scarring. ABDOMEN/PELVIS: Along the dome of the right hepatic lobe, there is a very questionable focus of hyperintensity with maximum SUV of 3.9 compared to background liver activity that has a typical SUV more round 3.4. This could well be artifactual and there is no corresponding abnormality on the CT data, with this questionable focus well under 1 cm in diameter. Accentuated activity in the anorectal region, maximum SUV 10.1, most likely to be physiologic given the location, although correlation with colon screening history is recommended. Incidental CT findings: Postoperative findings at the gastroesophageal junction. Dependent density in the gallbladder compatible with sludge or small gallstones. Photopenic right renal cysts. Aortoiliac atherosclerotic vascular disease. Sigmoid colon diverticulosis. Prostatomegaly. SKELETON: Scan accentuated activity in the right antecubital soft tissues felt to be injection related. No significant bony hypermetabolic activity. Incidental CT findings: Lower cervical spondylosis. IMPRESSION: 1. Hypermetabolic mass of the right tongue base and palatine tonsillar region, with extensive hypermetabolic right neck adenopathy and some hypermetabolic left neck adenopathy as well as a small but hypermetabolic right supraclavicular lymph node as detailed above. 2. Questionable focus of  accentuated activity along the dome of the liver, probably incidental. If the patient has abnormal liver enzymes or if otherwise clinically warranted, hepatic protocol MRI with and without contrast could be utilized to assess for  an actual underlying lesion in this region. 3. Accentuated anorectal activity. This is most commonly physiologic, but correlate with patient's colon screening history in assessing whether further workup is indicated. 4. Other imaging findings of potential clinical significance: Chronic right maxillary sinusitis. Aortic Atherosclerosis (ICD10-I70.0). Coronary atherosclerosis with mild cardiomegaly. Prostatomegaly. Sludge versus small gallstones in the gallbladder. Electronically Signed   By: Van Clines M.D.   On: 04/10/2020 15:03    SURGICAL PATHOLOGY   CASE: [id number]  PATIENT: Hermine Messick  Surgical Pathology Report    Reason for Addendum #1: Immunohistochemistry results   Clinical History: tongue base cancer with cervical metastases (cm)    FINAL MICROSCOPIC DIAGNOSIS:   A. TONGUE, RIGHT BASE MASS, BIOPSY:  - Squamous cell carcinoma.  - See comment.   COMMENT:  Immunohistochemistry will be performed and reported as an addendum.   Dr. Tresa Moore agrees.   Called to Dr. Redmond Baseman' office on 03/16/2020.  P16 positive  All questions were answered. The patient knows to call the clinic with any problems, questions or concerns. I spent 30 minutes in the care of this patient including H and P, review of records, counseling and coordination of care.     Benay Brubacher, MD 04/14/2020 2:58 PM

## 2020-04-14 NOTE — Patient Instructions (Signed)
[facility] of [facility] Dr. Debe Coder B. Benson Cook, D.M.D. Phone: [phone] Fax: [phone]   It was a pleasure seeing you today!  Please refer to the information below regarding your dental visit with [place], and call us should you have any questions or concerns that may come up after you leave.   Thank you for giving [place] the opportunity to provide care for you.  If there is anything we can do for you, please let us know.    RADIATION THERAPY AND INFORMATION REGARDING YOUR TEETH   . XEROSTOMIA (DRY MOUTH):  Your salivary glands may be in the field of radiation.  Radiation may include all or only part of your salivary glands.  This will cause your saliva to dry up, and you will have a dry mouth.  The dry mouth will be for the rest of your life unless your radiation oncologist tells you otherwise.  Your saliva has many functions: 1. It wets your tongue for speaking. 2. It coats your teeth and the inside of your mouth for easier movement. 3. It helps with chewing and swallowing food. 4. It helps clean away harmful acid and toxic products made by the germs in your mouth, therefore it helps prevent cavities. 5. It kills some germs in your mouth and helps to prevent gum disease. 6. It helps to carry flavor to your taste buds.  >> Once you have lost your saliva, you will be at higher risk for tooth decay and gum disease.    What can be done to help improve your mouth when there's not enough saliva: >> Your dentist may give a prescription for Salagen.  It will not bring back all of your saliva but may bring back some of it.  Also, your saliva may be thick and ropy or white and foamy. It will not feel like it use to feel. >> You will need to swish with water every time your mouth feels dry.  YOU CANNOT suck on any cough drops, mints, lemon drops, candy, vitamin C or any other products.  You cannot use anything other than water to make your mouth feel less dry.  If you want to  drink anything else, you have to drink it all at once and brush afterwards.  Be sure to discuss the details of your diet habits with your dentist or hygienist.   . RADIATION CARIES:  This is decay (cavities) that happens very quickly once your mouth is very dry due to radiation therapy.  Normally, cavities take six months to two years to become a problem.  When you have dry mouth, cavities may take as little as eight weeks to cause you a problem.    >> Dental check-ups every two months are necessary as long as you have a dry mouth. Radiation caries typically, but not always, start at your gum line where it is hard to see the cavity.  It is therefore also hard to fill these cavities adequately.  This high rate of cavities happens because your mouth no longer has saliva and therefore the acid made by the germs starts the decay process.  Whenever you eat anything the germs in your mouth change the food into acid.  The acid then burns a small hole in your tooth.  This small hole is the beginning of a cavity.  If this is not treated then it will grow bigger and become a cavity.  The way to avoid this hole getting bigger is to use fluoride  every evening as prescribed by your dentist following your radiation.   NOTE:  You have to make sure that your teeth are very clean before you use the fluoride.  This fluoride in turn will strengthen your teeth and prepare them for another day of fighting acid.  >> If you develop radiation caries many times, the damage is so large that you will have to have all your teeth removed.  This could be a big problem if some of these teeth are in the field of radiation.  Further details of why this could be a big problem will follow (see Osteoradionecrosis below).   . DYSGEUSIA (LOSS OF TASTE): This happens to varying degrees once you've had radiation therapy to your jaw region.  Many times taste is not completely lost, but becomes limited.  The loss of taste is mostly due to  radiation affecting your taste buds.  However, if you have no saliva in your mouth to carry the flavor to your taste buds, it would be difficult for your taste buds to taste anything.  That is why using water or a prescription for Salagen prior to meals and during meal times may help with some of the taste.  Keep in mind that taste generally returns very slowly over the course of several months or several years after radiation therapy.  Don't give up hope.   . TRISMUS (LIMITED JAW OPENING): According to your Radiation Oncologist, your TMJ or jaw joints are going to be partially or fully in the field of radiation.  This means that over time the muscles that help you open and close your mouth may get stiff.  This will potentially result in your not being able to open your mouth wide enough or as wide as you can open it now.    Let me give you an example of how slowly this happens and how unaware people are of it:   >> A gentlemen that had radiation therapy two years ago came back to me complaining that bananas are just too large for him to be able to fit them in between his teeth.  He was not able to open wide enough to bite into a banana.  This happens slowly and over a period of time.  What we do to try and prevent this:   1. Your dentist will probably give you a stack of sticks called a trismus exercise device.  This stack will help remind your muscles and your jaw joints to open up to the same distance every day.  Use these sticks every morning when you wake up, or according to the instructions given by your dentist.    2. You must use these sticks for at least one to two years after radiation therapy.  The reason for that is because it happens so slowly and keeps going on for about two years after radiation therapy.  Your hospital dentist will help you monitor your mouth opening and make sure that it's not getting smaller after radiation.  TRISMUS EXERCISES: >> Using the stack of sticks given to you  by your dentist, place the stack in your mouth and hold onto the other end for support. >> Leave the sticks in your mouth while holding the other end.  Allow 30 seconds for muscle stretching. >> Rest for a few seconds. >> Repeat 3-5 times. >> This exercise is recommended in the mornings and evenings unless otherwise instructed. >> The exercise should be done for a period of 2 YEARS  after the end of radiation. >> Your maximum jaw opening should be checked routinely at recall dental visits by your general dentist. >> You should report any changes, soreness, or difficulties encountered when doing the exercises to your dentist.   . OSTEORADIONECROSIS (ORN): This is a condition where your jaw bone after radiation therapy becomes very dry.  It has very little blood supply to keep it alive.  If you develop a cavity that turns into an abscess or an infection, then the jaw bone does not have enough blood supply to help fight the infection.  At this point it is very likely that the infection could cause the death of your jaw bone.  When you have dead bone it has to be removed.  Therefore, you might end up having to have surgery to remove part of your jaw bone, the part of the jaw bone that has been affected.     >> Healing is also a problem if you are to have surgery (like a tooth extraction) in the areas where the bone has had radiation therapy.  If you have surgery, you need more blood supply to heal which is not available.  When blood supply and oxygen are not available, there is a chance for the bone to die. >> Occasionally, ORN happens on its own with no obvious reason, but this is quite rare.  We believe that patients who continue to smoke and/or drink alcohol have a higher chance of having this problem. >> Once your jaw bone has had radiation therapy, if there are any remaining teeth in that area, it is not recommended to have them pulled unless your dentist or oral surgeon is aware of your history of  radiation and believes it is safe.  >> The risks for ORN either from infection or spontaneously occurring (with no reason) are life long.   QUESTIONS?  Call our office during office hours [phone].

## 2020-04-14 NOTE — Progress Notes (Signed)
Department of Dental Medicine     OUTPATIENT CONSULTATION  Service Date:   04/14/2020  Patient Name:  Jerry Cook Date of Birth:   1950/02/28 Medical Record Number: [id number]  Referring Provider:              Eppie Gibson, MD   PLAN & RECOMMENDATIONS  RECOMMENDATIONS > There are no current signs of acute dental infection including abscess, edema or erythema, or suspicious lesion requiring biopsy.  The patient does have a dentist that he sees regularly every 6 mos for cleanings and exams. >> Recommend that the patient return to our clinic following the completion of radiation therapy for a follow-up visit and then return to his primary dentist. >>> Plan to discuss with medical team and coordinate treatment as needed.  Upper and lower impressions were taken today for fabrication of scatter protection devices.  The patient is scheduled for an appointment on 4/1 @ 10AM for delivery.  >>  Discussed in detail all treatment options with the patient and they are agreeable to the plan.   Thank you for consulting with [facility] and for the opportunity to participate in this patient's treatment.  Should you have any questions or concerns, please contact the [facility] at [phone].   04/14/2020      CONSULT NOTE   COVID 19 SCREENING: The patient denies symptoms concerning for COVID-19 infection including fever, chills, cough, or newly developed shortness of breath.   HISTORY OF PRESENT ILLNESS: >> Jerry Cook is a very pleasant 70 y.o. male with h/o HTN, melena, BPH and weight loss who was recently diagnosed with SCC of the base of tongue and is anticipating radiation therapy.  The patient presents today for a medically necessary dental consultation as part of their pre-radiation work-up.  DENTAL HISTORY: > The patient reports that he does have a dentist that he sees regularly.  His last visit was 03/31/20 and his primary dentist is Dr. Otila Kluver, [place] (has been  going there for 20+ years).  He reports having h/o braces and orthognathic surgery when he was younger for a significant crossbite and underbite, and his jaw joint used to pop easily but has since resided and he has not had any residual pain or issues.  He currently denies any dental/orofacial pain or sensitivity. >> Patient is able to manage oral secretions.  Patient denies dysphagia, odynophagia, dysphonia, SOB and neck pain.  Patient denies fever, rigors and malaise.   CHIEF COMPLAINT:  Here for a pre-radiation dental consultation.   Patient Active Problem List   Diagnosis Date Noted  . Weight loss, unintentional 03/25/2020  . Squamous cell carcinoma of base of tongue ([place]) 03/25/2020  . HTN (hypertension), benign 03/06/2020  . BPH (benign prostatic hyperplasia) 03/06/2020  . Hemoptysis 03/04/2020  . Melena 03/04/2020  . Tongue mass   . Nondisplaced fracture of distal phalanx of right great toe, initial encounter for closed fracture 05/08/2018   Past Medical History:  Diagnosis Date  . GERD (gastroesophageal reflux disease)   . Hyperlipidemia   . Hypertension    Past Surgical History:  Procedure Laterality Date  . EYE SURGERY Bilateral    cataract  . LARYNGOSCOPY AND BRONCHOSCOPY N/A 03/13/2020   Procedure: Direct LARYNGOSCOPY WITH BX  AND ESPHOGOSCOPY;  Surgeon: Melida Quitter, MD;  Location: [facility];  Service: ENT;  Laterality: N/A;  . TONSILLECTOMY     No Known Allergies Current Outpatient Medications  Medication Sig Dispense Refill  . atorvastatin (LIPITOR)  10 MG tablet Take 10 mg by mouth at bedtime.    . bifidobacterium infantis (ALIGN) capsule Take 1 capsule by mouth daily.    . cetirizine (ZYRTEC) 10 MG tablet Take 10 mg by mouth daily.    . clonazePAM (KLONOPIN) 2 MG tablet Take 2 mg by mouth at bedtime.    . finasteride (PROSCAR) 5 MG tablet Take 5 mg by mouth daily.    . fluticasone (FLONASE) 50 MCG/ACT nasal spray Place 2 sprays into both nostrils as needed for  allergies. (Patient not taking: Reported on 03/25/2020)    . metoprolol tartrate (LOPRESSOR) 50 MG tablet Take 50 mg by mouth 2 (two) times daily.    [name] omeprazole (PRILOSEC) 20 MG capsule Take 20 mg by mouth daily.    [name] PARoxetine (PAXIL) 20 MG tablet Take 20 mg by mouth daily.    . sildenafil (REVATIO) 20 MG tablet Take 100 mg by mouth as needed (erectile dysfunction). (Patient not taking: Reported on 03/25/2020)    . tamsulosin (FLOMAX) 0.4 MG CAPS capsule Take 0.4 mg by mouth at bedtime.    . traZODone (DESYREL) 100 MG tablet Take 100 mg by mouth at bedtime.    . triamterene-hydrochlorothiazide (DYAZIDE) 37.5-25 MG capsule Take 1 capsule by mouth daily.     No current facility-administered medications for this visit.   Facility-Administered Medications Ordered in Other Visits  Medication Dose Route Frequency Provider Last Rate Last Admin  . fludeoxyglucose F - 18 (FDG) injection 7 millicurie  7 millicurie Intravenous Once PRN Abigail Miyamoto, MD        LABS: Lab Results  Component Value Date   WBC 9.9 03/25/2020   HGB 13.9 03/25/2020   HCT 41.8 03/25/2020   MCV 88.9 03/25/2020   PLT 218 03/25/2020      Component Value Date/Time   NA 139 03/25/2020 1238   K 3.9 03/25/2020 1238   CL 98 03/25/2020 1238   CO2 32 03/25/2020 1238   GLUCOSE 102 (H) 03/25/2020 1238   BUN 19 03/25/2020 1238   CREATININE 1.71 (H) 03/25/2020 1238   CALCIUM 9.6 03/25/2020 1238   GFRNONAA 42 (L) 03/25/2020 1238   Lab Results  Component Value Date   INR 1.2 03/03/2020   No results found for: PTT  Social History   Socioeconomic History  . Marital status: Single    Spouse name: Not on file  . Number of children: Not on file  . Years of education: Not on file  . Highest education level: Not on file  Occupational History  . Not on file  Tobacco Use  . Smoking status: Former Smoker    Years: 13.00    Quit date: 03/24/1979    Years since quitting: 41.0  . Smokeless tobacco: Never Used  . Tobacco  comment: Quit in 1981  Vaping Use  . Vaping Use: Never used  Substance and Sexual Activity  . Alcohol use: Not Currently    Alcohol/week: 1.0 standard drink    Types: 1 Cans of beer per week    Comment: Weekly  . Drug use: Never  . Sexual activity: Not Currently  Other Topics Concern  . Not on file  Social History Narrative  . Not on file   Social Determinants of Health   Financial Resource Strain: Not on file  Food Insecurity: Not on file  Transportation Needs: Not on file  Physical Activity: Not on file  Stress: Not on file  Social Connections: Not on file  Intimate Partner  Violence: Not on file   No family history on file.   REVIEW OF SYSTEMS: Reviewed with the patient as per HPI. PSYCH: Patient denies having dental phobia.  VITAL SIGNS: BP (!) 112/50 (BP Location: Right Arm)   Pulse (!) 46   Temp 98.1 F (36.7 C) (Oral)    PHYSICAL EXAM: >> General:  Well-developed, comfortable and in no apparent distress. >> Neurological:  Alert and oriented to person, place and  time. >> Extraoral:  Facial symmetry present without any edema or erythema.  No swelling or lymphadenopathy.  TMJ asymptomatic without clicks or crepitations. (+) Bilateral popping palpated upon opening. >> Maximum Interincisal Opening: 42 mm >> Intraoral:  Soft tissues appear well-perfused and mucous membranes moist.  FOM and vestibules soft and not raised. Oral cavity without mass or lesion. No signs of infection, parulis, sinus tract, edema or erythema evident upon exam.   (+) High palatal vault   DENTAL EXAM: Hard tissue exam completed and charted.  >> Dentition:  Overall fair remaining dentition.  Missing teeth, heavily restored dentition with multiple existing restorations/crown and bridge work and implants. >> The patient is maintaining good oral hygiene.  >> Periodontal: Pink, healthy gingival tissue with blunted papilla. Localized plaque accumulation. Generalized gingival recession.  >>  Caries: No clinical caries evident. #32B(V) abfraction with dentin exposure. >> Endodontics: #7, #15, #19, #21, #27 and #30 previous RCT with definitive full-coverage crowns (PFM). >> Removable/Fixed Prosthodontics: #10 implant placement with final crown. #19- #30 connected PFM bridge with #19, #21, #22, #27, #28 and #31 as abutments and #23, #24, #25 and #26 pontics. >> Occlusion: Edge to edge occlusion (lower anterior teeth are pontics to bridge). Supra-erupted tooth #15.   RADIOGRAPHIC EXAM: PAN imported from outside dental office (taken 08/15/2017) and Full Mouth Series exposed and interpreted (selective periapical images & BWs on L side were not taken b/c they were imported from outside office and taken on 03/31/20).  >> Condyles seated bilaterally in fossas.  No evidence of abnormal pathology.  All visualized osseous structures appear WNL. Radiopaque hardware evident bilaterally consistent with patient's h/o orthognathic surgery.  >> Generalized moderate horizontal bone loss consistent with moderate periodontitis vs gingival recession.   >> Missing teeth, multiple existing restorations, crowns, lower anterior bridge with anterior incisors as pontics and bilateral canines - #19 and #30 as abutments. #10 implant with crown. #7 has been previous endodontically treated with periapical radiolucency- pt asymptomatic & may just be healing lesion from previous infection.    ASSESSMENT:  1. Squamous cell carcinoma base of tongue 2. Preoperative dental consultation 3. Missing teeth 4. Gingivitis 5. Gingival recession, generalized 6. Abfraction/flexure 7. Malocclusion   PROCEDURES: 1. Upper and Lower alginate impressions taken and poured up in Type IV [employer] for fabrication of scatter protection devices.   2. Trismus appliance made using patient's baseline MIO.  Leta Speller, DAII demonstrated use of appliance.  Verbal and written postop instructions were given to the patient. 3. The common  and significant side effects of radiation therapy to the head and neck were explained and discussed with the patient.  The discussion included side effects of trismus (limited opening), dysgeusia (loss of taste), xerostomia (dry mouth), radiation caries and osteoradionecrosis of the jaw.  I also discussed the importance of maintaining optimal oral hygiene and oral health before, during and after radiation to decrease the risk of developing radiation cavities and the need for any surgery such as extractions after therapy.     PLAN AND RECOMMENDATIONS: >  I discussed the risks, benefits, and complications of various scenarios with the patient in relationship to their medical and dental conditions, which included osteoradionecrosis that could potentially occur either before, during or after their anticipated therapy if dental/oral concerns are not addressed.  I explained that if any chronic or acute dental/oral infection(s) are addressed and subsequently not maintained following medical optimization and recovery, their risk of the previously mentioned complications are just as high and could potentially occur postoperatively.  I explained all significant findings of the dental consultation with the patient and the recommended care including returning to his primary dentist following the completion of radiotherapy for routine dental care every 6 mos in order to maintain and optimize their oral health and prevent radiation caries.  The patient verbalized understanding of all findings, discussion, and recommendations. >>  We then discussed various treatment options to include no treatment, multiple extractions with alveoloplasty, pre-prosthetic surgery as indicated, periodontal therapy, dental restorations, root canal therapy, crown and bridge therapy, implant therapy, and replacement of missing teeth as indicated.  The patient verbalized understanding of all options, and currently wishes to proceed with returning to  our clinic s/p radiation therapy and subsequently returning to his dentist for routine care.  Appointment was scheduled for this Friday (4/1) for delivery of scatter protection devices prior to his simulation. >>>  Plan to discuss all findings and recommendations with medical team and coordinate future care as needed.  <> The patient tolerated today's visit well.  All questions and concerns were addressed and answered, and the patient departed in stable condition.   I spent in excess of 120 minutes during the conduct of this consultation and >50% of this time involved direct face-to-face encounter for counseling and/or coordination of the patient's care. [place] Benson Norway, D.M.D.

## 2020-04-15 ENCOUNTER — Other Ambulatory Visit: Payer: Self-pay

## 2020-04-15 DIAGNOSIS — C01 Malignant neoplasm of base of tongue: Secondary | ICD-10-CM

## 2020-04-16 ENCOUNTER — Encounter: Payer: Self-pay | Admitting: General Practice

## 2020-04-16 NOTE — Progress Notes (Signed)
[place] Work  Initial Assessment   Jerry Cook is a 69 y.o. year old male contacted by phone. Clinical Social Work was referred by radiation oncologist for assessment of psychosocial needs.   SDOH (Social Determinants of Health) assessments performed: Yes SDOH Interventions   Flowsheet Row Most Recent Value  SDOH Interventions   Food Insecurity Interventions Intervention Not Indicated  Housing Interventions Intervention Not Indicated  Social Connections Interventions Other (Comment)  [will touch base w patient periodically during treatment]      Distress Screen completed: Yes ONCBCN DISTRESS SCREENING 04/07/2020  Screening Type Initial Screening  Distress experienced in past week (1-10) 0  Physical Problem type -  Physician notified of physical symptoms No  Referral to clinical psychology No  Referral to clinical social work No  Referral to dietition No  Referral to financial advocate No  Referral to support programs No  Referral to palliative care No      Family/Social Information:  . Housing Arrangement: patient lives alone in his own residence, has dog Ivory Broad) who is a good companion . Family members/support persons in your life? Old friend calls him periodically, ex girlfriend helps w transportation.  Few other social connections. . Transportation concerns: Plans to drive to/from treatment, CSW also discussed option of [facility] if needed.  Of note, he does not cook at home - he drives to restaurants ([place] [employer] or [place]) for meals. . 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 Financial concerns: No o Type of concern: None . Food access concerns: He does not cook at home - eats fast food primarily.  Mentions eating subs and [employer].  Discussed how this may be difficult when he is under treatment for tongue cancer as his food needs may change.  Also, he may not be physically up to driving as much as he is used to doing.  He plans to discuss options with dietitian and stockpile foods that might be easier to eat during treatment.  He is also aware of food delivery services.  . Religious or spiritual practice: None . Medication Concerns: None mentioned . Services Currently in place:  none  Coping/ Adjustment to diagnosis: . Patient understands treatment plan and what happens next?  . Newly diagnosed with squamous cell carcinoma at base of tongue.  No current staging pending additional imaging.  Diagnosed after episodes of blood in mouth, coughing up blood.  He has met with both radiation and medical oncologists.  He could not definitively recall the planned course of radiation treatment, but was aware of upcoming appointments w dentist and CT Sim.   . Concerns about diagnosis and/or treatment: I'm not especially worried about anything . Patient reported stressors: Food, he is aware that what he can eat will change, he needs help adjusting to this and planning ahead as he lives alone with limited support . Current coping skills/ strengths: Average or above average intelligence, Communication skills and General fund of knowledge    SUMMARY: Current SDOH Barriers:  . Limited social support  Interventions: . Discussed common feeling and emotions when being diagnosed with cancer, and the importance of support during treatment . Informed patient of the support team roles and support services at [facility] . Provided CSW contact information and encouraged patient to call with any questions or concerns . Provided patient with information about Patient and [facility], will call him after he has begun treatment   Follow Up Plan: CSW will see patient on 4/21 for phone  check in visit Patient verbalizes understanding of plan: Yes    Beverely Pace , [place], LCSW Clinical Social Worker Phone:  [phone]

## 2020-04-17 ENCOUNTER — Other Ambulatory Visit: Payer: Self-pay

## 2020-04-17 ENCOUNTER — Ambulatory Visit (INDEPENDENT_AMBULATORY_CARE_PROVIDER_SITE_OTHER): Payer: Medicare HMO | Admitting: Dentistry

## 2020-04-17 VITALS — BP 124/66 | HR 55 | Temp 98.0°F

## 2020-04-17 DIAGNOSIS — Z463 Encounter for fitting and adjustment of dental prosthetic device: Secondary | ICD-10-CM | POA: Diagnosis not present

## 2020-04-17 DIAGNOSIS — C01 Malignant neoplasm of base of tongue: Secondary | ICD-10-CM

## 2020-04-17 NOTE — Progress Notes (Signed)
Department of Dental Medicine     SCATTER PROTECTION DEVICES: DELIVERY  Service Date:  04/17/2020  Patient Name:  Jerry Cook Date of Birth:   [birth year] Medical Record Number: [id number]  Referring Provider:              Eppie Gibson, MD   PLAN & RECOMMENDATIONS   > Delivered upper and lower scatter guards today. >> Plan to follow-up with patient s/p radiation therapy and then return to his primary dentist for routine dental care including replacement of missing teeth, cleanings and periodic exams.  >>  Discussed in detail all treatment options with the patient and they are agreeable to the plan.   04/17/2020     PROGRESS NOTE   COVID 19 SCREENING: The patient denies symptoms concerning for COVID-19 infection including fever, chills, cough, or newly developed shortness of breath.   HISTORY OF PRESENT ILLNESS: > Jerry Cook presents today for delivery/adjustment of upper and lower scatter protection devices. >> Medical and dental history reviewed with the patient.  No changes reported.  Simulation is scheduled for Monday 4/4.   CHIEF COMPLAINT:  Patient with no complaints.   Patient Active Problem List   Diagnosis Date Noted  . Weight loss, unintentional 03/25/2020  . Squamous cell carcinoma of base of tongue ([place]) 03/25/2020  . HTN (hypertension), benign 03/06/2020  . BPH (benign prostatic hyperplasia) 03/06/2020  . Hemoptysis 03/04/2020  . Melena 03/04/2020  . Tongue mass   . Nondisplaced fracture of distal phalanx of right great toe, initial encounter for closed fracture 05/08/2018   Past Medical History:  Diagnosis Date  . GERD (gastroesophageal reflux disease)   . Hyperlipidemia   . Hypertension    Current Outpatient Medications  Medication Sig Dispense Refill  . atorvastatin (LIPITOR) 10 MG tablet Take 10 mg by mouth at bedtime.    . bifidobacterium infantis (ALIGN) capsule Take 1 capsule by mouth daily.    . cetirizine (ZYRTEC) 10 MG tablet Take 10  mg by mouth daily.    . clonazePAM (KLONOPIN) 2 MG tablet Take 2 mg by mouth at bedtime.    . finasteride (PROSCAR) 5 MG tablet Take 5 mg by mouth daily.    . fluticasone (FLONASE) 50 MCG/ACT nasal spray Place 2 sprays into both nostrils as needed for allergies. (Patient not taking: Reported on 03/25/2020)    . metoprolol tartrate (LOPRESSOR) 50 MG tablet Take 50 mg by mouth 2 (two) times daily.    [name] omeprazole (PRILOSEC) 20 MG capsule Take 20 mg by mouth daily.    [name] PARoxetine (PAXIL) 20 MG tablet Take 20 mg by mouth daily.    . sildenafil (REVATIO) 20 MG tablet Take 100 mg by mouth as needed (erectile dysfunction). (Patient not taking: Reported on 03/25/2020)    . tamsulosin (FLOMAX) 0.4 MG CAPS capsule Take 0.4 mg by mouth at bedtime.    . traZODone (DESYREL) 100 MG tablet Take 100 mg by mouth at bedtime.    . triamterene-hydrochlorothiazide (DYAZIDE) 37.5-25 MG capsule Take 1 capsule by mouth daily.     No current facility-administered medications for this visit.   No Known Allergies   VITALS: BP 124/66 (BP Location: Right Arm)   Pulse (!) 55   Temp 98 F (36.7 C) (Oral)    ASSESSMENT: Patient with anticipated radiation therapy to the head and neck.  1. SCC of base of tongue   PROCEDURES: 1.   Appliances were tried in and adjusted as needed. Polished. 2.  Postoperative instructions were provided verbally by Leta Speller,       DAII concerning the use and care of appliances.   PLAN: > Patient to return to our clinic following the completion of radiation therapy for a follow-up appointment and delivery of upper and lower fluoride trays.  He will then return to his regular dentist for routine dental care including cleanings and exams. >> Patient instructed to call if questions or problems arise before then.   <> All questions and concerns were invited and addressed.  The patient tolerated today's visit well and departed in stable condition.  [place] Benson Norway, DMD

## 2020-04-20 ENCOUNTER — Other Ambulatory Visit: Payer: Self-pay

## 2020-04-20 ENCOUNTER — Ambulatory Visit
Admission: RE | Admit: 2020-04-20 | Discharge: 2020-04-20 | Disposition: A | Discharge status: Home / Self Care | Payer: Medicare HMO | Source: Ambulatory Visit | Attending: Radiation Oncology | Admitting: Radiation Oncology

## 2020-04-20 VITALS — BP 124/64 | HR 57 | Temp 96.9°F | Resp 18 | Wt 160.2 lb

## 2020-04-20 DIAGNOSIS — Z510 Encounter for antineoplastic radiation therapy: Secondary | ICD-10-CM | POA: Insufficient documentation

## 2020-04-20 DIAGNOSIS — Z79899 Other long term (current) drug therapy: Secondary | ICD-10-CM | POA: Insufficient documentation

## 2020-04-20 DIAGNOSIS — C01 Malignant neoplasm of base of tongue: Secondary | ICD-10-CM | POA: Insufficient documentation

## 2020-04-20 MED ORDER — SODIUM CHLORIDE 0.9% FLUSH
10.0000 mL | Freq: Once | INTRAVENOUS | Status: AC
  Administered 2020-04-20: 10 mL via INTRAVENOUS

## 2020-04-20 NOTE — Progress Notes (Signed)
Has armband been applied?  Yes.    Does patient have an allergy to IV contrast dye?: No.   Has patient ever received premedication for IV contrast dye?: No.   Does patient take metformin?: No.  Date of lab work: March 25, 2020 BUN: 19 CR: 1.71  IV site: forearm right, condition patent and no redness  Has IV site been added to flowsheet?  Yes.    BP 124/64 (BP Location: Left Arm, Patient Position: Sitting)   Pulse (!) 57   Temp (!) 96.9 F (36.1 C) (Temporal)   Resp 18   Wt 160 lb 4 oz (72.7 kg)   SpO2 95%   BMI 22.99 kg/m

## 2020-04-20 NOTE — Progress Notes (Signed)
Oncology Nurse Navigator Documentation  To provide support, encouragement and care continuity, met with Mr. Valliant during his CT SIM.  He tolerated procedure without difficulty, denied questions/concerns.    I encouraged him to call me prior to 04/27/20 [place].  [name] RN, BSN, OCN Head & Neck Oncology Nurse [place] at [facility] Phone # [phone]  Fax # [phone]

## 2020-04-21 ENCOUNTER — Inpatient Hospital Stay: Payer: Medicare HMO | Attending: Hematology and Oncology | Admitting: Nutrition

## 2020-04-21 NOTE — Progress Notes (Signed)
70 year old male diagnosed with base of tongue cancer followed by Dr. [name] and Dr.Iruku.  Patient will be receiving radiation therapy to begin on April 11 and complete on Friday, May 27.  Past medical history includes hypertension, GERD, hyperlipidemia.  Medications include Lipitor and Prilosec.  Labs include glucose 119 and creatinine 1.71 on March 9.  Height: 5 feet 10 inches. Weight: 160.25 pounds April 4. Usual body weight: 184 pounds February 15. BMI: 22.99. ECOG: 1.  Patient is alone and does not cook much.  He does prepare some food but keeps it simple.  He reports about a 20 pound weight loss over 2 months which is consistent with weights documented in epic.  This is a 13% weight loss in less than 2 months which is significant.  Patient reports weight loss began with biopsy.  He has tried Ensure in the past and likes it.  Nutrition diagnosis: Unintended weight loss related to tongue cancer as evidenced by 13% weight loss over 6 weeks.  Intervention: Educated to consume small frequent meals and snacks with high-calorie, high-protein foods. Provided nutrition fact sheets.  Reviewed high-protein high-calorie snacks and sheets fact sheet. Brief education provided on thinning thick secretions and dry mouth. Recommended patient sent oral nutrition supplements with oral intake declines and provided 1 complementary case of Ensure Enlive.  Monitoring, evaluation, goals: Patient will tolerate adequate calories and protein to minimize weight loss.  Next visit: Wednesday, April 20 after radiation therapy.  **Disclaimer: This note was dictated with voice recognition software. Similar sounding words can inadvertently be transcribed and this note may contain transcription errors which may not have been corrected upon publication of note.**

## 2020-04-24 DIAGNOSIS — Z510 Encounter for antineoplastic radiation therapy: Secondary | ICD-10-CM | POA: Diagnosis not present

## 2020-04-24 DIAGNOSIS — Z79899 Other long term (current) drug therapy: Secondary | ICD-10-CM | POA: Diagnosis not present

## 2020-04-24 DIAGNOSIS — C01 Malignant neoplasm of base of tongue: Secondary | ICD-10-CM | POA: Diagnosis not present

## 2020-04-27 ENCOUNTER — Other Ambulatory Visit: Payer: Self-pay

## 2020-04-27 ENCOUNTER — Ambulatory Visit
Admission: RE | Admit: 2020-04-27 | Discharge: 2020-04-27 | Disposition: A | Discharge status: Home / Self Care | Payer: Medicare HMO | Source: Ambulatory Visit | Attending: Radiation Oncology | Admitting: Radiation Oncology

## 2020-04-27 DIAGNOSIS — Z510 Encounter for antineoplastic radiation therapy: Secondary | ICD-10-CM | POA: Diagnosis not present

## 2020-04-27 DIAGNOSIS — Z79899 Other long term (current) drug therapy: Secondary | ICD-10-CM | POA: Diagnosis not present

## 2020-04-27 DIAGNOSIS — C01 Malignant neoplasm of base of tongue: Secondary | ICD-10-CM | POA: Diagnosis not present

## 2020-04-27 NOTE — Progress Notes (Signed)
Oncology Nurse Navigator Documentation   To provide support, encouragement and care continuity, met with Jerry Cook for his initial RT.    I reviewed the 2-step treatment process, answered questions.   Jerry Cook completed treatment without difficulty, denied questions/concerns.  I reviewed the registration/arrival procedure for subsequent treatments.  I went over is schedule and added appointments for SLP and PT on 4/21.   I encouraged him to call me with questions/concerns as tmts proceed.   [name] RN, BSN, OCN Head & Neck Oncology Nurse [place] at [facility] Phone # [phone]  Fax # [phone]

## 2020-04-28 ENCOUNTER — Ambulatory Visit
Admission: RE | Admit: 2020-04-28 | Discharge: 2020-04-28 | Disposition: A | Discharge status: Home / Self Care | Payer: Medicare HMO | Source: Ambulatory Visit | Attending: Radiation Oncology | Admitting: Radiation Oncology

## 2020-04-28 DIAGNOSIS — Z79899 Other long term (current) drug therapy: Secondary | ICD-10-CM | POA: Diagnosis not present

## 2020-04-28 DIAGNOSIS — C01 Malignant neoplasm of base of tongue: Secondary | ICD-10-CM

## 2020-04-28 DIAGNOSIS — Z510 Encounter for antineoplastic radiation therapy: Secondary | ICD-10-CM | POA: Diagnosis not present

## 2020-04-28 MED ORDER — SONAFINE EX EMUL
1.0000 "application " | Freq: Two times a day (BID) | CUTANEOUS | Status: DC
  Administered 2020-04-28: 1 via TOPICAL

## 2020-04-28 NOTE — Progress Notes (Signed)

## 2020-04-29 ENCOUNTER — Other Ambulatory Visit: Payer: Self-pay

## 2020-04-29 ENCOUNTER — Ambulatory Visit
Admission: RE | Admit: 2020-04-29 | Discharge: 2020-04-29 | Disposition: A | Discharge status: Home / Self Care | Payer: Medicare HMO | Source: Ambulatory Visit | Attending: Radiation Oncology | Admitting: Radiation Oncology

## 2020-04-29 DIAGNOSIS — C01 Malignant neoplasm of base of tongue: Secondary | ICD-10-CM | POA: Diagnosis not present

## 2020-04-29 DIAGNOSIS — Z510 Encounter for antineoplastic radiation therapy: Secondary | ICD-10-CM | POA: Diagnosis not present

## 2020-04-29 DIAGNOSIS — Z79899 Other long term (current) drug therapy: Secondary | ICD-10-CM | POA: Diagnosis not present

## 2020-04-30 ENCOUNTER — Other Ambulatory Visit: Payer: Self-pay

## 2020-04-30 ENCOUNTER — Ambulatory Visit
Admission: RE | Admit: 2020-04-30 | Discharge: 2020-04-30 | Disposition: A | Discharge status: Home / Self Care | Payer: Medicare HMO | Source: Ambulatory Visit | Attending: Radiation Oncology | Admitting: Radiation Oncology

## 2020-04-30 DIAGNOSIS — R634 Abnormal weight loss: Secondary | ICD-10-CM

## 2020-04-30 DIAGNOSIS — C01 Malignant neoplasm of base of tongue: Secondary | ICD-10-CM | POA: Diagnosis not present

## 2020-04-30 DIAGNOSIS — K148 Other diseases of tongue: Secondary | ICD-10-CM

## 2020-04-30 DIAGNOSIS — Z510 Encounter for antineoplastic radiation therapy: Secondary | ICD-10-CM | POA: Diagnosis not present

## 2020-04-30 DIAGNOSIS — Z79899 Other long term (current) drug therapy: Secondary | ICD-10-CM | POA: Diagnosis not present

## 2020-04-30 LAB — CBC WITH DIFFERENTIAL/PLATELET
Abs Immature Granulocytes: 0.03 10*3/uL (ref 0.00–0.07)
Basophils Absolute: 0.1 10*3/uL (ref 0.0–0.1)
Basophils Relative: 1 %
Eosinophils Absolute: 0.3 10*3/uL (ref 0.0–0.5)
Eosinophils Relative: 3 %
HCT: 39.3 % (ref 39.0–52.0)
Hemoglobin: 13.4 g/dL (ref 13.0–17.0)
Immature Granulocytes: 0 %
Lymphocytes Relative: 9 %
Lymphs Abs: 0.9 10*3/uL (ref 0.7–4.0)
MCH: 29.9 pg (ref 26.0–34.0)
MCHC: 34.1 g/dL (ref 30.0–36.0)
MCV: 87.7 fL (ref 80.0–100.0)
Monocytes Absolute: 1 10*3/uL (ref 0.1–1.0)
Monocytes Relative: 11 %
Neutro Abs: 7.1 10*3/uL (ref 1.7–7.7)
Neutrophils Relative %: 76 %
Platelets: 199 10*3/uL (ref 150–400)
RBC: 4.48 MIL/uL (ref 4.22–5.81)
RDW: 12.1 % (ref 11.5–15.5)
WBC: 9.4 10*3/uL (ref 4.0–10.5)
nRBC: 0 % (ref 0.0–0.2)

## 2020-05-01 ENCOUNTER — Ambulatory Visit
Admission: RE | Admit: 2020-05-01 | Discharge: 2020-05-01 | Disposition: A | Discharge status: Home / Self Care | Payer: Medicare HMO | Source: Ambulatory Visit | Attending: Radiation Oncology | Admitting: Radiation Oncology

## 2020-05-01 ENCOUNTER — Other Ambulatory Visit: Payer: Self-pay

## 2020-05-01 DIAGNOSIS — C01 Malignant neoplasm of base of tongue: Secondary | ICD-10-CM | POA: Diagnosis not present

## 2020-05-01 DIAGNOSIS — Z510 Encounter for antineoplastic radiation therapy: Secondary | ICD-10-CM | POA: Diagnosis not present

## 2020-05-01 DIAGNOSIS — Z79899 Other long term (current) drug therapy: Secondary | ICD-10-CM | POA: Diagnosis not present

## 2020-05-01 LAB — TSH: TSH: 1.27 u[IU]/mL (ref 0.320–4.118)

## 2020-05-04 ENCOUNTER — Other Ambulatory Visit: Payer: Self-pay | Admitting: Radiation Oncology

## 2020-05-04 ENCOUNTER — Other Ambulatory Visit: Payer: Self-pay

## 2020-05-04 ENCOUNTER — Ambulatory Visit
Admission: RE | Admit: 2020-05-04 | Discharge: 2020-05-04 | Disposition: A | Discharge status: Home / Self Care | Payer: Medicare HMO | Source: Ambulatory Visit | Attending: Radiation Oncology | Admitting: Radiation Oncology

## 2020-05-04 DIAGNOSIS — Z79899 Other long term (current) drug therapy: Secondary | ICD-10-CM | POA: Diagnosis not present

## 2020-05-04 DIAGNOSIS — C01 Malignant neoplasm of base of tongue: Secondary | ICD-10-CM | POA: Diagnosis not present

## 2020-05-04 DIAGNOSIS — Z510 Encounter for antineoplastic radiation therapy: Secondary | ICD-10-CM | POA: Diagnosis not present

## 2020-05-04 MED ORDER — LIDOCAINE VISCOUS HCL 2 % MT SOLN: OROMUCOSAL | 3 refills | Status: DC

## 2020-05-05 ENCOUNTER — Ambulatory Visit
Admission: RE | Admit: 2020-05-05 | Discharge: 2020-05-05 | Disposition: A | Discharge status: Home / Self Care | Payer: Medicare HMO | Source: Ambulatory Visit | Attending: Radiation Oncology | Admitting: Radiation Oncology

## 2020-05-05 DIAGNOSIS — C01 Malignant neoplasm of base of tongue: Secondary | ICD-10-CM | POA: Diagnosis not present

## 2020-05-05 DIAGNOSIS — Z79899 Other long term (current) drug therapy: Secondary | ICD-10-CM | POA: Diagnosis not present

## 2020-05-05 DIAGNOSIS — Z510 Encounter for antineoplastic radiation therapy: Secondary | ICD-10-CM | POA: Diagnosis not present

## 2020-05-06 ENCOUNTER — Ambulatory Visit
Admission: RE | Admit: 2020-05-06 | Discharge: 2020-05-06 | Disposition: A | Discharge status: Home / Self Care | Payer: Medicare HMO | Source: Ambulatory Visit | Attending: Radiation Oncology | Admitting: Radiation Oncology

## 2020-05-06 ENCOUNTER — Inpatient Hospital Stay: Payer: Medicare HMO | Admitting: Nutrition

## 2020-05-06 ENCOUNTER — Other Ambulatory Visit: Payer: Self-pay

## 2020-05-06 ENCOUNTER — Encounter: Payer: Self-pay | Admitting: Nutrition

## 2020-05-06 DIAGNOSIS — Z510 Encounter for antineoplastic radiation therapy: Secondary | ICD-10-CM | POA: Diagnosis not present

## 2020-05-06 DIAGNOSIS — Z79899 Other long term (current) drug therapy: Secondary | ICD-10-CM | POA: Diagnosis not present

## 2020-05-06 DIAGNOSIS — C01 Malignant neoplasm of base of tongue: Secondary | ICD-10-CM | POA: Diagnosis not present

## 2020-05-06 NOTE — Progress Notes (Signed)
Patient did not show up for nutrition appointment. 

## 2020-05-07 ENCOUNTER — Ambulatory Visit: Payer: Medicare HMO | Attending: Radiation Oncology | Admitting: Physical Therapy

## 2020-05-07 ENCOUNTER — Encounter: Payer: Self-pay | Admitting: Physical Therapy

## 2020-05-07 ENCOUNTER — Inpatient Hospital Stay: Payer: Medicare HMO | Admitting: Nutrition

## 2020-05-07 ENCOUNTER — Ambulatory Visit: Payer: Medicare HMO

## 2020-05-07 ENCOUNTER — Inpatient Hospital Stay: Payer: Medicare HMO | Admitting: General Practice

## 2020-05-07 ENCOUNTER — Ambulatory Visit
Admission: RE | Admit: 2020-05-07 | Discharge: 2020-05-07 | Disposition: A | Discharge status: Home / Self Care | Payer: Medicare HMO | Source: Ambulatory Visit | Attending: Radiation Oncology | Admitting: Radiation Oncology

## 2020-05-07 DIAGNOSIS — R262 Difficulty in walking, not elsewhere classified: Secondary | ICD-10-CM | POA: Diagnosis not present

## 2020-05-07 DIAGNOSIS — M6281 Muscle weakness (generalized): Secondary | ICD-10-CM | POA: Insufficient documentation

## 2020-05-07 DIAGNOSIS — R293 Abnormal posture: Secondary | ICD-10-CM | POA: Diagnosis not present

## 2020-05-07 DIAGNOSIS — Z510 Encounter for antineoplastic radiation therapy: Secondary | ICD-10-CM | POA: Diagnosis not present

## 2020-05-07 DIAGNOSIS — R1310 Dysphagia, unspecified: Secondary | ICD-10-CM | POA: Diagnosis not present

## 2020-05-07 DIAGNOSIS — C01 Malignant neoplasm of base of tongue: Secondary | ICD-10-CM

## 2020-05-07 DIAGNOSIS — Z79899 Other long term (current) drug therapy: Secondary | ICD-10-CM | POA: Diagnosis not present

## 2020-05-07 NOTE — Progress Notes (Signed)
Brief nutrition follow-up completed with patient during head and neck clinic.  Patient is receiving radiation therapy for tongue cancer.  He does not have a feeding tube. Weight decreased to 153.6 pounds on April 18 down from 160.25 April 4.  Patient is down 30.4 pounds over 2 months which is significant. Noted patient has a sore mouth from biopsy, mild fatigue, and dry mouth with thick saliva. Patient reports drinking 1 carton of Ensure Plus/Ensure Enlive daily. Reports he is a very good cook but does not always cook for himself. Reports he would like some additional Ensure samples.  Nutrition diagnosis: Unintended weight loss continues.  Intervention: Provided 1 complementary case of Ensure Plus.  Encourage patient to increase Ensure Plus to 3-4 cartons daily between meals.  Recommended patient request additional samples before he runs out.  Discouraged further weight loss.  Reminded patient of nutrition appointment next Wednesday after radiation therapy.  Monitoring, evaluation, goals: Patient will increase oral intake to minimize further weight loss.  Next visit: Wednesday, April 27.  **Disclaimer: This note was dictated with voice recognition software. Similar sounding words can inadvertently be transcribed and this note may contain transcription errors which may not have been corrected upon publication of note.**

## 2020-05-07 NOTE — Progress Notes (Signed)
[facility] CSW Progress Notes  Met w patient in exam room during Head and Neck MDC.  Patient had expressed difficulty paying for medications as he is on limited income.  Enrolled in [facility], provided first disbursement.  He also is working on qualifying for [employer].  He reports that eating is becoming more difficult "I dont feel like eating much of anything."  He lives alone, is accustomed to eating fast food which he goes out to get.  He did eat a mushroom and Loss adjuster, chartered from [employer] which he enjoyed - "it took me a lot longer to eat, but I liked it."  He continues to drive himself to/from appointments, he can use [facility] if desired.  He was somewhat vague about the time of his radiation appointment today, thought it was at 2:30 when it was actually at 45.  He has expressed some concerns about his memory in the past.  CSW will continue to follow him during treatment.   Edwyna Shell, LCSW Clinical Social Worker Phone:  [phone]

## 2020-05-07 NOTE — Progress Notes (Signed)
Oncology Nurse Navigator Documentation  I met with Mr. Jerry Cook before and after his appointments with Garald Balding SLP and Allyson Sabal PT today in head and neck MDC. He also spoke with Dory Peru RD briefly and was given ensure and instructed to drink 4-5 daily. He verbalized his understanding of instructions and I assisted him out to his car with the box of ensure. He knows to call me if he has any concerns or questions.   [name] RN, BSN, OCN Head & Neck Oncology Nurse [place] at [facility] Phone # [phone]  Fax # [phone]

## 2020-05-07 NOTE — Therapy (Addendum)
[facility] [address] [place], [place], [address] Phone: [phone]   Fax:  [phone]  Speech Language Pathology Treatment  Patient Details  Name: Jerry Cook MRN: [id number] Date of Birth: [birth year] Referring Provider (SLP): Eppie Gibson, MD   Encounter Date: 05/07/2020   End of Session - 05/07/20 1557    Visit Number 1    Number of Visits 7    Date for SLP Re-Evaluation 08/05/20    SLP Start Time 1000    SLP Stop Time  0211    SLP Time Calculation (min) 35 min    Activity Tolerance Patient tolerated treatment well           Past Medical History:  Diagnosis Date  . GERD (gastroesophageal reflux disease)   . Hyperlipidemia   . Hypertension     Past Surgical History:  Procedure Laterality Date  . EYE SURGERY Bilateral    cataract  . LARYNGOSCOPY AND BRONCHOSCOPY N/A 03/13/2020   Procedure: Direct LARYNGOSCOPY WITH BX  AND ESPHOGOSCOPY;  Surgeon: Melida Quitter, MD;  Location: [facility];  Service: ENT;  Laterality: N/A;  . TONSILLECTOMY      There were no vitals filed for this visit.   Subjective Assessment - 05/07/20 1011    Subjective "It took me longer than usual to eat a [employer] thickburger yesterday."    Currently in Pain? No/denies             SLP Evaluation [facility] - 05/07/20 1011      SLP Visit Information   SLP Received On 05/07/20    Referring Provider (SLP) Eppie Gibson, MD    Onset Date Spring 2022 "I don't know" pt stated    Medical Diagnosis Tongue base cancer      Prior Functional Status   Cognitive/Linguistic Baseline Within functional limits      Cognition   Overall Cognitive Status Within Functional Limits for tasks assessed      Oral Motor/Sensory Function   Overall Oral Motor/Sensory Function Pt remembers always needing to have to do a finger sweep of bil lateral sulci - possibly shorter lingual frenulum (?).      Motor Speech   Overall Motor Speech Impaired at  baseline    Articulation Impaired    Level of Impairment Phrase    Intelligibility Intelligible   pt always remembers talking with some reduced posterior lateral lingual movement ("slushy" /s/, /z/, "zh" and "sh")                 PHI: ED admit on 03-03-20. CT neck 03-13-20 revealed rt base of tongue and glossotonsillar sulcus mass extending to region of rt valleculae. Extensive bil cystic/necrotic nodal metastatic disease.[place] did DL and found SCCA. PET 04-10-20 confirming hypermetabolic mass of rt tongue baes and palatine tonsillar region with bil neck involvement. Pt started rad tx 04-27-20 and completed on 06-12-20.       Pt currently tolerates regular diet/thin liquids with extra time. Today SLP recommended pt have only bite of solid and clear that through oral and pharyngeal cavities prior to having liquids. POs: Pt swallowed [place] sandwich and water without overt s/s aspiration. Thyroid elevation appeared adequate, and swallows appeared timely. Pt's swallow deemed WNL/WFL at this time.   Because data states the risk for dysphagia during and after radiation treatment is high due to undergoing radiation tx, SLP taught pt about the possibility of reduced/limited ability for PO intake during rad tx. SLP encouraged pt to continue  swallowing POs as far into rad tx as possible.  SLP educated pt re: changes to swallowing musculature after rad tx, and why adherence to dysphagia HEP provided today and PO consumption was necessary to inhibit muscle fibrosis following rad tx. Pt demonstrated understanding of these things to SLP.    SLP then developed a HEP for pt and pt was instructed how to perform exercises involving lingual, vocal, and pharyngeal strengthening. SLP performed each exercise and pt return demonstrated each exercise. SLP ensured pt performance was correct prior to moving on to next exercise. Pt was instructed to complete this program 2 times a day, 6-7 days/week until 6 months after his or  her last rad tx, then x2 a week after that.       SLP Education - 05/07/20 1556    Education Details HEP procedure, late effects head/neck radiation on swallow ability    Person(s) Educated Patient    Methods Explanation;Demonstration;Verbal cues;Handout    Comprehension Verbalized understanding;Returned demonstration;Verbal cues required;Need further instruction            SLP Short Term Goals - 05/07/20 1602      SLP SHORT TERM GOAL #1   Title pt will complete HEP with occasional min A    Time 2    Period --   sessions, for all STGs   Status New      SLP SHORT TERM GOAL #2   Title pt will tell SLP why pt is completing HEP with fading to modified independence    Time 2    Status New      SLP SHORT TERM GOAL #3   Title pt will describe 3 overt s/s aspiration PNA with modified independence    Time 2    Status New      SLP SHORT TERM GOAL #4   Title pt will tell SLP how a food journal could hasten return to a more normalized diet    Time 3    Status New            SLP Long Term Goals - 05/07/20 1603      SLP LONG TERM GOAL #1   Title pt will complete HEP with rare min A over 2 visits    Time 4    Period --   or 7 total sessions, for all LTGs   Status New      SLP LONG TERM GOAL #2   Title pt will describe how to modify HEP over time, and the timeline associated with reduction in HEP frequency with modified independence over two sessions    Time 7    Status New            Plan - 05/07/20 1557    Clinical Impression Statement At this time pt swallowing is deemed WNL/WFL with dys III ([place] sandwich) adn water. SLP told pt to have one thing in his mouth at a time (see note from today) - either liquid or solid and swallow fully prior to adding something else prior to taking a sip or another bite. SLP designed an individualized HEP for dysphagia and pt completed each exercise on their own with mod-max cues faded to modified independent. SLP rewrote some goals to  be simpler for pt to recall and understand. There are no overt s/s aspiration reported by pt at this time. Data indicate that pt's swallow ability will likely decrease over the course of radiation therapy and could very well decline over time following  conclusion of their radiation therapy due to muscle disuse atrophy and/or muscle fibrosis. Pt will cont to need to be seen by SLP in order to assess safety of PO intake, assess the need for recommending any objective swallow assessment, and ensuring pt correctly completes the individualized HEP.    Speech Therapy Frequency --   once/ approx 4 weeks   Duration --   7 total visits   Treatment/Interventions Aspiration precaution training;Pharyngeal strengthening exercises;Diet toleration management by SLP;Trials of upgraded texture/liquids;Patient/family education;Compensatory strategies;SLP instruction and feedback;Cueing hierarchy    Potential to Achieve Goals Good    Potential Considerations Ability to learn/carryover information    SLP Home Exercise Plan provide today    Consulted and Agree with Plan of Care Patient           Patient will benefit from skilled therapeutic intervention in order to improve the following deficits and impairments:   Dysphagia, unspecified type    Problem List Patient Active Problem List   Diagnosis Date Noted  . Weight loss, unintentional 03/25/2020  . Squamous cell carcinoma of base of tongue ([place]) 03/25/2020  . HTN (hypertension), benign 03/06/2020  . BPH (benign prostatic hyperplasia) 03/06/2020  . Hemoptysis 03/04/2020  . Melena 03/04/2020  . Tongue mass   . Nondisplaced fracture of distal phalanx of right great toe, initial encounter for closed fracture 05/08/2018    [facility] 05/07/2020, 4:05 PM  [facility] [address] [place], [place], [address] Phone: [phone]   Fax:  [phone]   Name: Jerry Cook MRN: [id number] Date  of Birth: Jun 17, 1950

## 2020-05-07 NOTE — Therapy (Signed)
[place], [place], [address] Phone: [phone]   Fax:  [phone]  Physical Therapy Evaluation  Patient Details  Name: Jerry Cook MRN: [id number] Date of Birth: Oct 29, 1950 Referring Provider (PT): Reita May Date: 05/07/2020   PT End of Session - 05/07/20 1107    Visit Number 1    Number of Visits 9    Date for PT Re-Evaluation 06/04/20    PT Start Time 1116    PT Stop Time 1150    PT Time Calculation (min) 34 min    Activity Tolerance Patient tolerated treatment well    Behavior During Therapy [facility] for tasks assessed/performed           Past Medical History:  Diagnosis Date  . GERD (gastroesophageal reflux disease)   . Hyperlipidemia   . Hypertension     Past Surgical History:  Procedure Laterality Date  . EYE SURGERY Bilateral    cataract  . LARYNGOSCOPY AND BRONCHOSCOPY N/A 03/13/2020   Procedure: Direct LARYNGOSCOPY WITH BX  AND ESPHOGOSCOPY;  Surgeon: Melida Quitter, MD;  Location: [facility];  Service: ENT;  Laterality: N/A;  . TONSILLECTOMY      There were no vitals filed for this visit.    Subjective Assessment - 05/07/20 1015    Subjective I am feeling a little tired.    Pertinent History Squamous cell carcinoma of base of tongue Stage II (cT3, cN2, cM0, p16+), 03/13/20 CT neck revealed a 2.5 x 1.9 x 3.0 cm right base of tongue and glossotonsillar sulcus mass extending to the region of the right vallecula, highly suspicious for a squamous cell carcinoma primary tumor. There was also noted to be extensive bilateral cystic/necrotic nodal metastatic disease at the right level 1, 2, 3, and 4 stations and left level 2 station. There were several lymph nodes demonstrating ill-defined margins, highly suspicious for extracapsular extension. Finally, there was lymphadenopathy significantly narrowing the upper internal jugular veins bilaterally; direct laryngoscopy with biopsy was  completed on 03/13/20 revealing squamous cell carcinoma; PET completed 04/10/20 revealing a hypermetabolic mass of the right tongue base and palatine tonsillar region, with extensive hypermetabolic left neck adenopathy as well as  small but hypermetabolic right supraclavicular lymph node; will receive 35 fractions of radiation to his base of tongue and bilateral neck. He started on 4/11 and will complete on 5/27.    Patient Stated Goals to gain info from providers    Currently in Pain? No/denies              [facility] PT Assessment - 05/07/20 1119      Assessment   Medical Diagnosis base of tongue cancer    Referring Provider (PT) [name]    Onset Date/Surgical Date 03/13/20    Hand Dominance Right    Prior Therapy none      Precautions   Precautions Other (comment)    Precaution Comments active cancer      Balance Screen   Has the patient fallen in the past 6 months Yes    How many times? --   pt reports he does not know how many times he has falled but it has been a couple times a week   Has the patient had a decrease in activity level because of a fear of falling?  No    Is the patient reluctant to leave their home because of a fear of falling?  No      Home Environment  Living Environment Private residence    Living Arrangements Other (Comment)   dog   Available Help at Discharge Friend(s)    Type of [place] to enter    Entrance Stairs-Number of Steps 6    Entrance Stairs-Rails Can reach both    [place] One level      Prior Function   Level of [place] Retired    Leisure pt does not currently exercise      Cognition   Overall Cognitive Status Within Functional Limits for tasks assessed      Functional Tests   Functional tests Sit to Stand      Sit to Stand   Comments 30 sit to stand: 4 reps with loss of balance backwards and uncontrolled sit      Posture/Postural Control   Posture/Postural Control Postural  limitations    Postural Limitations Rounded Shoulders;Forward head      ROM / Strength   AROM / PROM / Strength AROM      AROM   Overall AROM Comments shoulder ROM WFL    AROM Assessment Site Cervical    Cervical Flexion WFL    Cervical Extension WFL    Cervical - Right Side Bend WFl    Cervical - Left Side Bend WFL    Cervical - Right Rotation 25% limited    Cervical - Left Rotation 25% limited      Strength   Right Hip Flexion 5/5    Left Hip Flexion 5/5    Right Knee Flexion 5/5    Right Knee Extension 5/5    Left Knee Flexion 4/5    Left Knee Extension 5/5    Right Ankle Dorsiflexion 5/5    Left Ankle Dorsiflexion 4/5      Ambulation/Gait   Ambulation/Gait Yes    Ambulation/Gait Assistance 6: Modified independent (Device/Increase time)    Ambulation Distance (Feet) 10 Feet    Gait Pattern Poor foot clearance - left;Poor foot clearance - right             LYMPHEDEMA/ONCOLOGY QUESTIONNAIRE - 05/07/20 0001      Lymphedema Assessments   Lymphedema Assessments Head and Neck      Head and Neck   4 cm superior to sternal notch around neck 42.5 cm    6 cm superior to sternal notch around neck 43.5 cm    8 cm superior to sternal notch around neck 45 cm                   Objective measurements completed on examination: See above findings.               PT Education - 05/07/20 1017    Education Details Neck ROM, importance of posture when sitting, standing and lying down, deep breathing, walking program and importance of staying active throughout treatment, CURE article on staying active, "Why exercise?" flyer, lymphedema and PT info    Person(s) Educated Patient    Methods Explanation;Handout    Comprehension Verbalized understanding               PT Long Term Goals - 05/07/20 1157      PT LONG TERM GOAL #1   Title Pt will demonstrate ability to complete 10 sit to stands in 30 seconds without use of UEs with no loss of balance to  decrease fall risk.    Baseline 4 reps with loss of  balance    Time 4    Period Weeks    Status New    Target Date 06/04/20      PT LONG TERM GOAL #2   Title Pt will report no falls in a 3 weeks period of time to decrease risk of injury    Time 4    Period Weeks    Status New    Target Date 06/04/20      PT LONG TERM GOAL #3   Title Pt will be able to get off the floor in the case that he does fall at home to decrease reliance on caregivers    Time 4    Period Weeks    Status New    Target Date 06/04/20      PT LONG TERM GOAL #4   Title Pt will be indpeendent in a home exercise program for continued strengthening and stretching.    Time 4    Period Weeks    Status New    Target Date 06/04/20              Head and Neck Clinic Goals - 05/07/20 1157      Patient will be able to verbalize understanding of a home exercise program for cervical range of motion, posture, and walking.    Time 1    Period Days    Status Achieved      Patient will be able to verbalize understanding of proper sitting and standing posture.    Time 1    Period Days    Status Achieved      Patient will be able to verbalize understanding of lymphedema risk and availability of treatment for this condition.    Time 1    Period Days    Status Achieved              Plan - 05/07/20 1108    Clinical Impression Statement Pt presents with recently diagnosed base of tongue cancer, stage 3. Cervical ROM is slightly limited in direction of bilateral cervical rotation. Shoulder ROM is WFL. Pt reports falling several times a week at home on his carpet. He reports he just looses his balance. he also reports loosing balance when turning too quickly. Pt only able to complete 4 sit to stands in 30 sec which is poor for his age and he lost his balance posteriorly with uncontrolled sit in chair. He reports difficulty getting off the couch.  Educated pt about signs and symptoms of lymphedema  as well as anatomy and physiology of lymphatic system. Educated pt in importance of staying as active as possible throughout treatment to decrease fatigue as well as head and neck ROM exercises to decrease loss of ROM. Pt would benefit from skilled PT services to improve balance, increase functional strength and decrease falls.    Stability/Clinical Decision Making Stable/Uncomplicated    Clinical Decision Making Low    Rehab Potential Good    PT Frequency 2x / week    PT Duration 4 weeks    PT Treatment/Interventions ADLs/Self Care Home Management;Therapeutic activities;Therapeutic exercise;Gait training;Balance training;Neuromuscular re-education;Patient/family education;Manual techniques;Passive range of motion    PT Next Visit Plan assess prone hip extension strength and S/L hip abduction strength - add goal as needed, do BERG and add goal, begin balance exercises,    PT Home Exercise Plan head and neck ROM exercises    Consulted and Agree with Plan of Care Patient  Patient will benefit from skilled therapeutic intervention in order to improve the following deficits and impairments:  Postural dysfunction,Decreased strength,Decreased activity tolerance,Decreased range of motion,Decreased balance,Decreased mobility,Difficulty walking  Visit Diagnosis: Difficulty in walking, not elsewhere classified - Plan: PT plan of care cert/re-cert  Muscle weakness (generalized) - Plan: PT plan of care cert/re-cert  Abnormal posture - Plan: PT plan of care cert/re-cert  Malignant neoplasm of base of tongue ([place]) - Plan: PT plan of care cert/re-cert     Problem List Patient Active Problem List   Diagnosis Date Noted  . Weight loss, unintentional 03/25/2020  . Squamous cell carcinoma of base of tongue ([place]) 03/25/2020  . HTN (hypertension), benign 03/06/2020  . BPH (benign prostatic hyperplasia) 03/06/2020  . Hemoptysis 03/04/2020  . Melena 03/04/2020  . Tongue mass   . Nondisplaced  fracture of distal phalanx of right great toe, initial encounter for closed fracture 05/08/2018    Allyson Sabal [facility] 05/07/2020, 12:02 PM  [place], [place], [address] Phone: [phone]   Fax:  [phone]  Name: Jerry Cook MRN: [id number] Date of Birth: 1950-10-23  Manus Gunning, PT 05/07/20 12:02 PM

## 2020-05-07 NOTE — Patient Instructions (Signed)
SWALLOWING EXERCISES Do these until 6 months after your last day of radiation, then 2-3 times per week afterwards  1. Effortful Swallows - Press your tongue against the roof of your mouth for 3 seconds, then squeeze the muscles in your neck while you swallow your saliva or a sip of water - Repeat 10-15 times, 2-3 times a day, and use whenever you eat or drink  2. [place]ako Swallow - swallow with your tongue sticking out - Stick tongue out past your lips and gently bite tongue with your teeth - Swallow, while holding your tongue with your teeth - Repeat 10-15 times, 2-3 times a day *use a wet spoon if your mouth gets dry*  3. Chin pushback - Open your mouth  - Place your fist UNDER your chin near your neck - Tuck your chin and push back with your fist for 5 seconds - Repeat 10 times, 2-3 times a day        4. "Super Swallow"  - Take a breath and hold it  - Bear down (like pushing your bowels)  - Swallow then IMMEDIATELY cough  - Repeat 10 times, 2-3 times a day

## 2020-05-08 ENCOUNTER — Ambulatory Visit
Admission: RE | Admit: 2020-05-08 | Discharge: 2020-05-08 | Disposition: A | Discharge status: Home / Self Care | Payer: Medicare HMO | Source: Ambulatory Visit | Attending: Radiation Oncology | Admitting: Radiation Oncology

## 2020-05-08 ENCOUNTER — Other Ambulatory Visit: Payer: Self-pay

## 2020-05-08 DIAGNOSIS — Z79899 Other long term (current) drug therapy: Secondary | ICD-10-CM | POA: Diagnosis not present

## 2020-05-08 DIAGNOSIS — Z510 Encounter for antineoplastic radiation therapy: Secondary | ICD-10-CM | POA: Diagnosis not present

## 2020-05-08 DIAGNOSIS — C01 Malignant neoplasm of base of tongue: Secondary | ICD-10-CM | POA: Diagnosis not present

## 2020-05-11 ENCOUNTER — Ambulatory Visit: Payer: Medicare HMO

## 2020-05-11 ENCOUNTER — Telehealth: Payer: Self-pay

## 2020-05-11 NOTE — Telephone Encounter (Signed)
Noticed patient had not arrived for 14:45 radiation appointment. Tried both numbers listed in chart (home and cell) but neither were answered, and the voicemail did not indicate that they belonged to Mr. Jerry Cook so unable to leave voicemail. Called significant other [name] to see if she had an update on patient's status. She stated she believed he most likely was asleep and forgot to set an alarm for his appointments. Will check on patient tomorrow to ensure he is aware and plans to come for his radiation treatment

## 2020-05-12 ENCOUNTER — Other Ambulatory Visit: Payer: Self-pay

## 2020-05-12 ENCOUNTER — Ambulatory Visit
Admission: RE | Admit: 2020-05-12 | Discharge: 2020-05-12 | Disposition: A | Discharge status: Home / Self Care | Payer: Medicare HMO | Source: Ambulatory Visit | Attending: Radiation Oncology | Admitting: Radiation Oncology

## 2020-05-12 DIAGNOSIS — D649 Anemia, unspecified: Secondary | ICD-10-CM | POA: Diagnosis not present

## 2020-05-12 DIAGNOSIS — C01 Malignant neoplasm of base of tongue: Secondary | ICD-10-CM | POA: Diagnosis not present

## 2020-05-12 DIAGNOSIS — Z79899 Other long term (current) drug therapy: Secondary | ICD-10-CM | POA: Diagnosis not present

## 2020-05-12 DIAGNOSIS — Z510 Encounter for antineoplastic radiation therapy: Secondary | ICD-10-CM | POA: Diagnosis not present

## 2020-05-12 DIAGNOSIS — C029 Malignant neoplasm of tongue, unspecified: Secondary | ICD-10-CM | POA: Diagnosis not present

## 2020-05-12 DIAGNOSIS — K649 Unspecified hemorrhoids: Secondary | ICD-10-CM | POA: Diagnosis not present

## 2020-05-13 ENCOUNTER — Encounter: Payer: Self-pay | Admitting: Nutrition

## 2020-05-13 ENCOUNTER — Ambulatory Visit: Payer: Medicare HMO | Admitting: Physical Therapy

## 2020-05-13 ENCOUNTER — Ambulatory Visit: Payer: Medicare HMO

## 2020-05-13 ENCOUNTER — Inpatient Hospital Stay: Payer: Medicare HMO | Admitting: Nutrition

## 2020-05-13 NOTE — Progress Notes (Signed)
Patient did not show up for nutrition appointment. 

## 2020-05-14 ENCOUNTER — Ambulatory Visit: Payer: Medicare HMO

## 2020-05-14 NOTE — Progress Notes (Signed)
Oncology Nurse Navigator Documentation  Jerry Cook did not show for his radiation yesterday or today. I was able to speak to him today and he tells me that he is not feeling well and decided not to come for his treatment today because he believe the radiation treatments are not making him feel well. I explained that there are expected side effects from radiation but that he needs to come for treatment to treat his cancer properly. He voiced his understanding and tells me that he will try to make it for his treatment tomorrow. I provided him with my contact information to call me if he was going to miss treatment or needed me for anything else.   [name] RN, BSN, OCN Head & Neck Oncology Nurse [place] at [facility] Phone # [phone]  Fax # [phone]

## 2020-05-15 ENCOUNTER — Ambulatory Visit: Payer: Medicare HMO

## 2020-05-15 ENCOUNTER — Ambulatory Visit
Admission: RE | Admit: 2020-05-15 | Discharge: 2020-05-15 | Disposition: A | Discharge status: Home / Self Care | Payer: Medicare HMO | Source: Ambulatory Visit | Attending: Radiation Oncology | Admitting: Radiation Oncology

## 2020-05-15 DIAGNOSIS — C01 Malignant neoplasm of base of tongue: Secondary | ICD-10-CM | POA: Diagnosis not present

## 2020-05-15 DIAGNOSIS — Z79899 Other long term (current) drug therapy: Secondary | ICD-10-CM | POA: Diagnosis not present

## 2020-05-15 DIAGNOSIS — Z510 Encounter for antineoplastic radiation therapy: Secondary | ICD-10-CM | POA: Diagnosis not present

## 2020-05-18 ENCOUNTER — Ambulatory Visit
Admission: RE | Admit: 2020-05-18 | Discharge: 2020-05-18 | Disposition: A | Discharge status: Home / Self Care | Payer: Medicare HMO | Source: Ambulatory Visit | Attending: Radiation Oncology | Admitting: Radiation Oncology

## 2020-05-18 ENCOUNTER — Other Ambulatory Visit: Payer: Self-pay

## 2020-05-18 ENCOUNTER — Ambulatory Visit: Payer: Medicare HMO | Attending: Radiation Oncology

## 2020-05-18 DIAGNOSIS — C01 Malignant neoplasm of base of tongue: Secondary | ICD-10-CM | POA: Diagnosis not present

## 2020-05-18 DIAGNOSIS — M6281 Muscle weakness (generalized): Secondary | ICD-10-CM | POA: Diagnosis not present

## 2020-05-18 DIAGNOSIS — Z510 Encounter for antineoplastic radiation therapy: Secondary | ICD-10-CM | POA: Insufficient documentation

## 2020-05-18 DIAGNOSIS — R262 Difficulty in walking, not elsewhere classified: Secondary | ICD-10-CM | POA: Diagnosis not present

## 2020-05-18 DIAGNOSIS — R293 Abnormal posture: Secondary | ICD-10-CM | POA: Diagnosis not present

## 2020-05-18 NOTE — Patient Instructions (Signed)
Heel Raise: Bilateral (Standing)   Cancer Rehab [phone]    Stand near counter for fingertip support if needed. Rise on balls of feet. Repeat __10-20__ times per set. Do _1-2___ sets per session. Do __2__ sessions per day.  SINGLE LIMB STANCE    Stand at counter (corner if you have one) for minimal arm support. Raise leg. Hold _10-20__ seconds. Repeat with other leg. Once this becomes easier, for increased challenge, close eyes with fingertips on counter for safety. _3-5__ reps per set, _2-3__ sets per day.  Tandem Stance    Stand at counter (corner if you have one). Right foot in front of left, heel touching toe both feet "straight ahead". Stand on Foot Triangle of Support with both feet. Balance in this position _10-20__ seconds. Do with left foot in front of right. Once this becomes easier, for increased challenge, close eyes with fingertips on counter for safety.  Step-Up: Forward    Move step-stool to counter or hold rail if at steps, but as little as able. Step up forward keeping opposite foot off step. Keep pelvis level and back straight. Hold the single leg stand for 3 seconds, then come back down slowly.  Do _10__ times, do _1-2_ sets, on each leg, _1-2__ times per day.      HIP: Flexion Standing    Holding onto counter lift leg straight in front keeping knee straight. Slow and controlled! _10__ reps per set, _2-3__ sets per day. Then repeat with other leg.   Hip Extension (Standing)    Stand with support at counter. Squeeze pelvic floor and hold so as not to twist hips and don't lean forward.  Move right leg backward with straight knee. Slow and controlled! Repeat _10__ times. Do _2-3__ times a day. Repeat with other leg.  Hip Abduction (Standing)    Stand with support. Squeeze pelvic floor and hold. Lift right leg out to side, keeping toe forward.  Repeat _10__ times. Do _2-3__ times a day. Repeat with other leg.    Straight Leg Raise    Tighten  stomach and slowly raise locked right leg __6-10__ inches from floor. Repeat __10__ times per set. Do _1-2___ sets per session. Do _2-3___ sessions per day.  HIP: Abduction - Side-Lying    Lie on side, legs straight and in line with trunk. Squeeze glutes. Raise top leg up and slightly back. Point toes forward. __10_ reps per set, __2-3_ sets per day. Bend bottom leg to stabilize pelvis.  Bridge    Lie back, legs bent. Inhale, pressing hips up. Keeping ribs in, lengthen lower back. Exhale, rolling down along spine from top. Repeat __10__ times. Do __2-3__ sessions per day.  KNEE: Extension, Long Arc Quads - Sitting    Raise leg until knee is straight. _10-20__ reps per set,hold __5__ seconds,  __2-3_ sets per day  FUNCTIONAL MOBILITY: Marching (Therapy Ball)    Sit on therapy ball. Raise left leg then right to march in place. Alternate. __10-20_ reps per set, __2-3_ sets per day.

## 2020-05-18 NOTE — Therapy (Signed)
[place], [place], [address] Phone: [phone]   Fax:  [phone]  Physical Therapy Treatment  Patient Details  Name: Jerry Cook MRN: [id number] Date of Birth: 06/23/1950 Referring Provider (PT): Reita May Date: 05/18/2020   PT End of Session - 05/18/20 1707    Visit Number 2    Number of Visits 9    Date for PT Re-Evaluation 06/04/20    PT Start Time 1606    PT Stop Time 1702    PT Time Calculation (min) 56 min    Activity Tolerance Patient tolerated treatment well    Behavior During Therapy [facility] for tasks assessed/performed           Past Medical History:  Diagnosis Date  . GERD (gastroesophageal reflux disease)   . Hyperlipidemia   . Hypertension     Past Surgical History:  Procedure Laterality Date  . EYE SURGERY Bilateral    cataract  . LARYNGOSCOPY AND BRONCHOSCOPY N/A 03/13/2020   Procedure: Direct LARYNGOSCOPY WITH BX  AND ESPHOGOSCOPY;  Surgeon: Melida Quitter, MD;  Location: [facility];  Service: ENT;  Laterality: N/A;  . TONSILLECTOMY      There were no vitals filed for this visit.   Subjective Assessment - 05/18/20 1609    Subjective My saliva is getting much thicker. I am trying the mouth wash with salt and baking soda at night and in the morning. I fall most nights when I turn off the light and get disoriented trying to get to the bed. I have a night light already, just keep falling. I feel tired all the time and that is making my balance worse.    Pertinent History Squamous cell carcinoma of base of tongue Stage II (cT3, cN2, cM0, p16+), 03/13/20 CT neck revealed a 2.5 x 1.9 x 3.0 cm right base of tongue and glossotonsillar sulcus mass extending to the region of the right vallecula, highly suspicious for a squamous cell carcinoma primary tumor. There was also noted to be extensive bilateral cystic/necrotic nodal metastatic disease at the right level 1, 2, 3, and 4  stations and left level 2 station. There were several lymph nodes demonstrating ill-defined margins, highly suspicious for extracapsular extension. Finally, there was lymphadenopathy significantly narrowing the upper internal jugular veins bilaterally; direct laryngoscopy with biopsy was completed on 03/13/20 revealing squamous cell carcinoma; PET completed 04/10/20 revealing a hypermetabolic mass of the right tongue base and palatine tonsillar region, with extensive hypermetabolic left neck adenopathy as well as  small but hypermetabolic right supraclavicular lymph node; will receive 35 fractions of radiation to his base of tongue and bilateral neck. He started on 4/11 and will complete on 5/27.    Patient Stated Goals to gain info from providers    Currently in Pain? No/denies                             [facility] Adult PT Treatment/Exercise - 05/18/20 0001      Self-Care   Self-Care Other Self-Care Comments    Other Self-Care Comments  Educated pt in importance of daily walking program and working on including this into his day as able. Pt reports to his mailbox is a reasonable distance for him so encouraged him to try this 2-3x/day increasing as able.      Knee/Hip Exercises: Standing   Heel Raises Both;10 reps    Heel Raises Limitations at back  of bike for +2 HHA for all standing exercises    Hip Flexion Stengthening    Hip Abduction Stengthening;Right;Left;10 reps    Abduction Limitations VCs for no hip er    Hip Extension Stengthening;Right;Left;10 reps    Extension Limitations tactile cues to keep knee extended    Forward Step Up Right;Left;10 reps;Hand Hold: 2;Step Height: 6"   with 3 sec hold on step for each rep   Functional Squat 5 reps    Functional Squat Limitations in front of chair and mini squat x5      Knee/Hip Exercises: Supine   Bridges Strengthening;10 reps    Straight Leg Raises Strengthening;Right;Left;10 reps    Straight Leg Raises Limitations VCs to  keep toes in neutral    Other Supine Knee/Hip Exercises Bil hip abduction in S/L with tactile cues to prevent hip flexion                  PT Education - 05/18/20 1702    Education Details Balance activites and bil LE strength in varying positions    Person(s) Educated Patient    Methods Explanation;Demonstration;Handout    Comprehension Verbalized understanding;Returned demonstration;Need further instruction               PT Long Term Goals - 05/07/20 1157      PT LONG TERM GOAL #1   Title Pt will demonstrate ability to complete 10 sit to stands in 30 seconds without use of UEs with no loss of balance to decrease fall risk.    Baseline 4 reps with loss of balance    Time 4    Period Weeks    Status New    Target Date 06/04/20      PT LONG TERM GOAL #2   Title Pt will report no falls in a 3 weeks period of time to decrease risk of injury    Time 4    Period Weeks    Status New    Target Date 06/04/20      PT LONG TERM GOAL #3   Title Pt will be able to get off the floor in the case that he does fall at home to decrease reliance on caregivers    Time 4    Period Weeks    Status New    Target Date 06/04/20      PT LONG TERM GOAL #4   Title Pt will be indpeendent in a home exercise program for continued strengthening and stretching.    Time 4    Period Weeks    Status New    Target Date 06/04/20                 Plan - 05/18/20 1707    Clinical Impression Statement First session of treatment today for balance activities and bil LE strength in varying positions. All were added to HEP and encouraged pt to perform these as able througout his day being mindful of not overtaxing himself. Also spent time during his seated rest breaks educating him on the importance of working on a daily walking program. It was determined that walking to his mailbox was a reasonable distance for right now so encouraged him to try to do this 2-3x/day and as he feels stronger to  try this twice at a time. Pt verbalized understanding all instruction today and was able to perform all exercises though did require tactile cuing for corect LE positioning due to weakness. Reminded pt of his next  scheduled visit with [place] this week as he issed both appointments last week. Pt seems overwhelmed by all the appointments and fatigue he is currently experiencing and this seemes to be why he missed last weeks appts. Encouraged him to see Almyra Deforest, SLP which he is scheudled for this week as he is struggling with swallowing due to pain fom biopsy and thick saliva.    Stability/Clinical Decision Making Stable/Uncomplicated    Rehab Potential Good    PT Frequency 2x / week    PT Duration 4 weeks    PT Treatment/Interventions ADLs/Self Care Home Management;Therapeutic activities;Therapeutic exercise;Gait training;Balance training;Neuromuscular re-education;Patient/family education;Manual techniques;Passive range of motion    PT Next Visit Plan Assess S/L hip abduction strength (pt unable to lay prone for hip ext assess) - add goal as needed, do BERG and add goal, review HEP issued today and progress as able    PT Home Exercise Plan head and neck ROM exercises; balance activites and bil LE strength    Consulted and Agree with Plan of Care Patient           Patient will benefit from skilled therapeutic intervention in order to improve the following deficits and impairments:  Postural dysfunction,Decreased strength,Decreased activity tolerance,Decreased range of motion,Decreased balance,Decreased mobility,Difficulty walking  Visit Diagnosis: Difficulty in walking, not elsewhere classified  Muscle weakness (generalized)  Abnormal posture  Malignant neoplasm of base of tongue ([place])     Problem List Patient Active Problem List   Diagnosis Date Noted  . Weight loss, unintentional 03/25/2020  . Squamous cell carcinoma of base of tongue ([place]) 03/25/2020  . HTN (hypertension), benign  03/06/2020  . BPH (benign prostatic hyperplasia) 03/06/2020  . Hemoptysis 03/04/2020  . Melena 03/04/2020  . Tongue mass   . Nondisplaced fracture of distal phalanx of right great toe, initial encounter for closed fracture 05/08/2018    Otelia Limes, PTA 05/18/2020, 5:19 PM  [place], [place], [address] Phone: [phone]   Fax:  [phone]  Name: Jerry Cook MRN: [id number] Date of Birth: August 28, 1950

## 2020-05-19 ENCOUNTER — Ambulatory Visit
Admission: RE | Admit: 2020-05-19 | Discharge: 2020-05-19 | Disposition: A | Discharge status: Home / Self Care | Payer: Medicare HMO | Source: Ambulatory Visit | Attending: Radiation Oncology | Admitting: Radiation Oncology

## 2020-05-19 ENCOUNTER — Other Ambulatory Visit: Payer: Self-pay | Admitting: Radiation Oncology

## 2020-05-19 DIAGNOSIS — Z510 Encounter for antineoplastic radiation therapy: Secondary | ICD-10-CM | POA: Diagnosis not present

## 2020-05-19 DIAGNOSIS — C01 Malignant neoplasm of base of tongue: Secondary | ICD-10-CM

## 2020-05-19 DIAGNOSIS — K148 Other diseases of tongue: Secondary | ICD-10-CM

## 2020-05-19 LAB — CBC WITH DIFFERENTIAL/PLATELET
Abs Immature Granulocytes: 0.05 10*3/uL (ref 0.00–0.07)
Basophils Absolute: 0.1 10*3/uL (ref 0.0–0.1)
Basophils Relative: 1 %
Eosinophils Absolute: 0.2 10*3/uL (ref 0.0–0.5)
Eosinophils Relative: 2 %
HCT: 41.5 % (ref 39.0–52.0)
Hemoglobin: 14.1 g/dL (ref 13.0–17.0)
Immature Granulocytes: 1 %
Lymphocytes Relative: 6 %
Lymphs Abs: 0.5 10*3/uL — ABNORMAL LOW (ref 0.7–4.0)
MCH: 29.1 pg (ref 26.0–34.0)
MCHC: 34 g/dL (ref 30.0–36.0)
MCV: 85.6 fL (ref 80.0–100.0)
Monocytes Absolute: 1.2 10*3/uL — ABNORMAL HIGH (ref 0.1–1.0)
Monocytes Relative: 12 %
Neutro Abs: 7.8 10*3/uL — ABNORMAL HIGH (ref 1.7–7.7)
Neutrophils Relative %: 78 %
Platelets: 233 10*3/uL (ref 150–400)
RBC: 4.85 MIL/uL (ref 4.22–5.81)
RDW: 12 % (ref 11.5–15.5)
WBC: 9.8 10*3/uL (ref 4.0–10.5)
nRBC: 0 % (ref 0.0–0.2)

## 2020-05-19 LAB — BASIC METABOLIC PANEL - CANCER CENTER ONLY
Anion gap: 14 (ref 5–15)
BUN: 38 mg/dL — ABNORMAL HIGH (ref 8–23)
CO2: 32 mmol/L (ref 22–32)
Calcium: 9.9 mg/dL (ref 8.9–10.3)
Chloride: 94 mmol/L — ABNORMAL LOW (ref 98–111)
Creatinine: 2.38 mg/dL — ABNORMAL HIGH (ref 0.61–1.24)
GFR, Estimated: 28 mL/min — ABNORMAL LOW (ref >60)
Glucose, Bld: 118 mg/dL — ABNORMAL HIGH (ref 70–99)
Potassium: 3.8 mmol/L (ref 3.5–5.1)
Sodium: 140 mmol/L (ref 135–145)

## 2020-05-20 ENCOUNTER — Ambulatory Visit: Payer: Medicare HMO

## 2020-05-20 ENCOUNTER — Other Ambulatory Visit: Payer: Self-pay | Admitting: Radiation Oncology

## 2020-05-20 ENCOUNTER — Ambulatory Visit
Admission: RE | Admit: 2020-05-20 | Discharge: 2020-05-20 | Disposition: A | Discharge status: Home / Self Care | Payer: Medicare HMO | Source: Ambulatory Visit | Attending: Radiation Oncology | Admitting: Radiation Oncology

## 2020-05-20 ENCOUNTER — Inpatient Hospital Stay: Payer: Medicare HMO | Admitting: Nutrition

## 2020-05-20 ENCOUNTER — Inpatient Hospital Stay: Payer: Medicare HMO | Attending: Hematology and Oncology | Admitting: General Practice

## 2020-05-20 ENCOUNTER — Other Ambulatory Visit: Payer: Self-pay

## 2020-05-20 DIAGNOSIS — C01 Malignant neoplasm of base of tongue: Secondary | ICD-10-CM | POA: Diagnosis not present

## 2020-05-20 DIAGNOSIS — E860 Dehydration: Secondary | ICD-10-CM | POA: Insufficient documentation

## 2020-05-20 DIAGNOSIS — Z510 Encounter for antineoplastic radiation therapy: Secondary | ICD-10-CM | POA: Diagnosis not present

## 2020-05-20 MED ORDER — SODIUM CHLORIDE 0.9 % IV SOLN
Freq: Once | INTRAVENOUS | Status: AC
  Filled 2020-05-20: qty 250

## 2020-05-20 MED ORDER — HYDROCODONE-ACETAMINOPHEN 7.5-325 MG/15ML PO SOLN: 10.0000 mL | ORAL | 0 refills | Status: DC | PRN

## 2020-05-20 NOTE — Progress Notes (Signed)
Nutrition follow-up completed with patient during IV fluids.  Patient receives radiation therapy for tongue cancer.  He does not have a feeding tube. Weight decreased and documented as 143.4 pounds May 3.  This is decreased from 184 pounds February 15.  This is a 22% weight loss in less than 3 months which is significant. He has completed 16 out of 35 radiation therapy treatments. He reports painful swallowing.  States he does not have pain medication.  Noted he does have lidocaine ordered. Patient denies nausea, vomiting, diarrhea, and constipation. So far today, he has had Sprite and nothing else.  He is agreeable to drinking Ensure complete while receiving fluids.  States he has a lot of vanilla Ensure at home. Patient reports he will eat a little bit of applesauce and some Jell-O with fruit in it. He drinks water out of a glass but cannot tell me how much. He purchased some soup which he can just heat up.  He has not felt like cooking.  Estimated nutrition needs: 2175-2375 cal, 93-110 g protein, 2.4 L fluid.  Nutrition diagnosis: Unintended weight loss continues.  Intervention: Provided additional samples of oral nutrition supplements.  Recommended patient drink 4 cartons daily along with smaller more frequent foods throughout the day.  Patient would require a minimum of 6 cartons of Ensure plus or equivalent to meet minimum estimated calorie needs. Stressed importance of increasing overall fluid intake as well as p.o. intake to minimize further weight loss and dehydration. Reviewed easy to prepare foods that patient could just microwave Recommended 8 "glasses" of liquid daily.  I recommend feeding tube placement.  Patient is unlikely to be able to increase nutrition needs and hydration needs to complete treatment and healing after treatment.  Monitoring, evaluation, goals: Will monitor weight and ability to increase oral intake and fluid intake.  Next visit: Tuesday, May  10.  **Disclaimer: This note was dictated with voice recognition software. Similar sounding words can inadvertently be transcribed and this note may contain transcription errors which may not have been corrected upon publication of note.**

## 2020-05-20 NOTE — Patient Instructions (Signed)
Dr. [name] is sending a prescription for narcotic pain medication to your preferred pharmacy. These medications can often make you constipated, so please take Miralax once a day to help maintain regular bowel movements. You can decrease Miralax use if you  start having looser or more frequent bowel movements. Please do not drive while taking this medication. If you need to take this medication before your radiation appointments, then we will help set up transportation to bring you back and forth from the [place]. Call Dr. Pearlie Oyster nurse if you have any questions

## 2020-05-20 NOTE — Progress Notes (Signed)
[place] CSW Progress Notes  Met w patient in clinic.  He is significantly fatigued.  States "I may have missed a doctors appointment today because I fell asleep on the couch."  Difficult to determine how he gets his meals/food.  He is not cooking for himself, he relies on fast food.  He has tried sub sandwich (could not eat the lettuce) as well as a [employer] chicken salad (also difficult to eat). He has no family/friends to help him.  He drives himself to/from appointments and errands - he declined [employer]. He also wants to continue to use his current pharmacy [facility] [address] declined information on pharmacies that deliver. He did want information on a handicap placard so he can park closer to stores.  Provided second disbursement of [employer], also gave information on Cancer Care.  He is struggling to take care of himself while in treatment and dealing with fatigue and other side effects.  CSW will continue to monitor and look for resources to [place], [place] Worker Phone:  [phone]

## 2020-05-21 ENCOUNTER — Ambulatory Visit
Admission: RE | Admit: 2020-05-21 | Discharge: 2020-05-21 | Disposition: A | Discharge status: Home / Self Care | Payer: Medicare HMO | Source: Ambulatory Visit | Attending: Radiation Oncology | Admitting: Radiation Oncology

## 2020-05-21 ENCOUNTER — Telehealth: Payer: Self-pay | Admitting: Hematology and Oncology

## 2020-05-21 ENCOUNTER — Other Ambulatory Visit: Payer: Self-pay

## 2020-05-21 DIAGNOSIS — C01 Malignant neoplasm of base of tongue: Secondary | ICD-10-CM | POA: Diagnosis not present

## 2020-05-21 DIAGNOSIS — Z510 Encounter for antineoplastic radiation therapy: Secondary | ICD-10-CM | POA: Diagnosis not present

## 2020-05-21 DIAGNOSIS — R634 Abnormal weight loss: Secondary | ICD-10-CM

## 2020-05-21 NOTE — Telephone Encounter (Signed)
Scheduled appts per secure chat with RN Marolyn Hammock, Called pt, no answer. Left msg with appts date and times.

## 2020-05-22 ENCOUNTER — Other Ambulatory Visit: Payer: Self-pay

## 2020-05-22 ENCOUNTER — Ambulatory Visit
Admission: RE | Admit: 2020-05-22 | Discharge: 2020-05-22 | Disposition: A | Discharge status: Home / Self Care | Payer: Medicare HMO | Source: Ambulatory Visit | Attending: Radiation Oncology | Admitting: Radiation Oncology

## 2020-05-22 ENCOUNTER — Inpatient Hospital Stay: Payer: Medicare HMO

## 2020-05-22 ENCOUNTER — Telehealth: Payer: Self-pay | Admitting: Radiation Oncology

## 2020-05-22 VITALS — BP 110/64 | HR 42 | Temp 97.6°F | Resp 16

## 2020-05-22 DIAGNOSIS — C01 Malignant neoplasm of base of tongue: Secondary | ICD-10-CM

## 2020-05-22 DIAGNOSIS — Z510 Encounter for antineoplastic radiation therapy: Secondary | ICD-10-CM | POA: Diagnosis not present

## 2020-05-22 DIAGNOSIS — E860 Dehydration: Secondary | ICD-10-CM | POA: Diagnosis not present

## 2020-05-22 MED ORDER — HEPARIN SOD (PORK) LOCK FLUSH 100 UNIT/ML IV SOLN
500.0000 [IU] | Freq: Once | INTRAVENOUS | Status: DC
  Filled 2020-05-22: qty 5

## 2020-05-22 MED ORDER — SODIUM CHLORIDE 0.9% FLUSH
10.0000 mL | Freq: Once | INTRAVENOUS | Status: DC
  Filled 2020-05-22: qty 10

## 2020-05-22 MED ORDER — SODIUM CHLORIDE 0.9 % IV SOLN
Freq: Once | INTRAVENOUS | Status: AC
  Filled 2020-05-22: qty 250

## 2020-05-22 NOTE — Progress Notes (Signed)
Per Dr. [name]: OK to run IVFs as fast as patient can tolerate.

## 2020-05-22 NOTE — Progress Notes (Signed)
Oncology Nurse Navigator Documentation  At Dr. Pearlie Oyster request I talked with Mr. Alvizo regarding her recommendation that he have a PEG placed due to his weight loss, decreased appetite, and poor intake while receiving radiation. I discussed the need for good nutrition during treatment and after treatment to aid in the healing process. At this time he is not agreeable to have a PEG placed and told me that he was able to drink one protein shake last night. I advised him that he would need to ingest 6 protein shakes daily as recommended by Dory Peru RD to meet his minimum daily requirement. He plans to increase his intake over the weekend and discuss with Dr. [name] further on Monday during his weekly visit with her. I did make him aware that he is scheduled every Monday, Wednesday, and Friday for IVF in Medical Oncology. He knows to call me if I can provide any further assistance.   [name] RN, BSN, OCN Head & Neck Oncology Nurse [place] at [facility] Phone # [phone]  Fax # [phone]

## 2020-05-22 NOTE — Telephone Encounter (Signed)
Scheduled appts per 5/4 sch msg. Called pt, no answer. Left msg with appts dates and times.

## 2020-05-22 NOTE — Patient Instructions (Signed)
Dehydration, Adult Dehydration is condition in which there is not enough water or other fluids in the body. This happens when a person loses more fluids than he or she takes in. Important body parts cannot work right without the right amount of fluids. Any loss of fluids from the body can cause dehydration. Dehydration can be mild, worse, or very bad. It should be treated right away to keep it from getting very bad. What are the causes? This condition may be caused by:  Conditions that cause loss of water or other fluids, such as: ? Watery poop (diarrhea). ? Vomiting. ? Sweating a lot. ? Peeing (urinating) a lot.  Not drinking enough fluids, especially when you: ? Are ill. ? Are doing things that take a lot of energy to do.  Other illnesses and conditions, such as fever or infection.  Certain medicines, such as medicines that take extra fluid out of the body (diuretics).  Lack of safe drinking water.  Not being able to get enough water and food. What increases the risk? The following factors may make you more likely to develop this condition:  Having a long-term (chronic) illness that has not been treated the right way, such as: ? Diabetes. ? Heart disease. ? Kidney disease.  Being 65 years of age or older.  Having a disability.  Living in a place that is high above the ground or sea (high in altitude). The thinner, dried air causes more fluid loss.  Doing exercises that put stress on your body for a long time. What are the signs or symptoms? Symptoms of dehydration depend on how bad it is. Mild or worse dehydration  Thirst.  Dry lips or dry mouth.  Feeling dizzy or light-headed, especially when you stand up from sitting.  Muscle cramps.  Your body making: ? Dark pee (urine). Pee may be the color of tea. ? Less pee than normal. ? Less tears than normal.  Headache. Very bad dehydration  Changes in skin. Skin may: ? Be cold to the touch (clammy). ? Be blotchy  or pale. ? Not go back to normal right after you lightly pinch it and let it go.  Little or no tears, pee, or sweat.  Changes in vital signs, such as: ? Fast breathing. ? Low blood pressure. ? Weak pulse. ? Pulse that is more than 100 beats a minute when you are sitting still.  Other changes, such as: ? Feeling very thirsty. ? Eyes that look hollow (sunken). ? Cold hands and feet. ? Being mixed up (confused). ? Being very tired (lethargic) or having trouble waking from sleep. ? Short-term weight loss. ? Loss of consciousness. How is this treated? Treatment for this condition depends on how bad it is. Treatment should start right away. Do not wait until your condition gets very bad. Very bad dehydration is an emergency. You will need to go to a hospital.  Mild or worse dehydration can be treated at home. You may be asked to: ? Drink more fluids. ? Drink an oral rehydration solution (ORS). This drink helps get the right amounts of fluids and salts and minerals in the blood (electrolytes).  Very bad dehydration can be treated: ? With fluids through an IV tube. ? By getting normal levels of salts and minerals in your blood. This is often done by giving salts and minerals through a tube. The tube is passed through your nose and into your stomach. ? By treating the root cause. Follow these instructions at   home: Oral rehydration solution If told by your doctor, drink an ORS:  Make an ORS. Use instructions on the package.  Start by drinking small amounts, about  cup (120 mL) every 5-10 minutes.  Slowly drink more until you have had the amount that your doctor said to have. Eating and drinking  Drink enough clear fluid to keep your pee pale yellow. If you were told to drink an ORS, finish the ORS first. Then, start slowly drinking other clear fluids. Drink fluids such as: ? Water. Do not drink only water. Doing that can make the salt (sodium) level in your body get too low. ? Water  from ice chips you suck on. ? Fruit juice that you have added water to (diluted). ? Low-calorie sports drinks.  Eat foods that have the right amounts of salts and minerals, such as: ? Bananas. ? Oranges. ? Potatoes. ? Tomatoes. ? Spinach.  Do not drink alcohol.  Avoid: ? Drinks that have a lot of sugar. These include:  High-calorie sports drinks.  Fruit juice that you did not add water to.  Soda.  Caffeine. ? Foods that are greasy or have a lot of fat or sugar.         General instructions  Take over-the-counter and prescription medicines only as told by your doctor.  Do not take salt tablets. Doing that can make the salt level in your body get too high.  Return to your normal activities as told by your doctor. Ask your doctor what activities are safe for you.  Keep all follow-up visits as told by your doctor. This is important. Contact a doctor if:  You have pain in your belly (abdomen) and the pain: ? Gets worse. ? Stays in one place.  You have a rash.  You have a stiff neck.  You get angry or annoyed (irritable) more easily than normal.  You are more tired or have a harder time waking than normal.  You feel: ? Weak or dizzy. ? Very thirsty. Get help right away if you have:  Any symptoms of very bad dehydration.  Symptoms of vomiting, such as: ? You cannot eat or drink without vomiting. ? Your vomiting gets worse or does not go away. ? Your vomit has blood or green stuff in it.  Symptoms that get worse with treatment.  A fever.  A very bad headache.  Problems with peeing or pooping (having a bowel movement), such as: ? Watery poop that gets worse or does not go away. ? Blood in your poop (stool). This may cause poop to look black and tarry. ? Not peeing in 6-8 hours. ? Peeing only a small amount of very dark pee in 6-8 hours.  Trouble breathing. These symptoms may be an emergency. Do not wait to see if the symptoms will go away. Get  medical help right away. Call your local emergency services (911 in the [place] Do not drive yourself to the hospital. Summary  Dehydration is a condition in which there is not enough water or other fluids in the body. This happens when a person loses more fluids than he or she takes in.  Treatment for this condition depends on how bad it is. Treatment should be started right away. Do not wait until your condition gets very bad.  Drink enough clear fluid to keep your pee pale yellow. If you were told to drink an oral rehydration solution (ORS), finish the ORS first. Then, start slowly drinking other clear fluids.    Take over-the-counter and prescription medicines only as told by your doctor.  Get help right away if you have any symptoms of very bad dehydration. This information is not intended to replace advice given to you by your health care provider. Make sure you discuss any questions you have with your health care provider. Document Revised: 08/16/2018 Document Reviewed: 08/16/2018 Elsevier Patient Education  2021 [employer]  

## 2020-05-23 ENCOUNTER — Ambulatory Visit: Payer: Medicare HMO

## 2020-05-25 ENCOUNTER — Inpatient Hospital Stay: Payer: Medicare HMO

## 2020-05-25 ENCOUNTER — Other Ambulatory Visit: Payer: Self-pay

## 2020-05-25 ENCOUNTER — Ambulatory Visit: Payer: Medicare HMO

## 2020-05-25 DIAGNOSIS — C01 Malignant neoplasm of base of tongue: Secondary | ICD-10-CM

## 2020-05-25 DIAGNOSIS — K148 Other diseases of tongue: Secondary | ICD-10-CM

## 2020-05-25 DIAGNOSIS — E860 Dehydration: Secondary | ICD-10-CM | POA: Diagnosis not present

## 2020-05-25 DIAGNOSIS — R634 Abnormal weight loss: Secondary | ICD-10-CM

## 2020-05-25 LAB — CBC WITH DIFFERENTIAL/PLATELET
Abs Immature Granulocytes: 0.02 10*3/uL (ref 0.00–0.07)
Basophils Absolute: 0 10*3/uL (ref 0.0–0.1)
Basophils Relative: 1 %
Eosinophils Absolute: 0.2 10*3/uL (ref 0.0–0.5)
Eosinophils Relative: 2 %
HCT: 38.1 % — ABNORMAL LOW (ref 39.0–52.0)
Hemoglobin: 12.6 g/dL — ABNORMAL LOW (ref 13.0–17.0)
Immature Granulocytes: 0 %
Lymphocytes Relative: 4 %
Lymphs Abs: 0.3 10*3/uL — ABNORMAL LOW (ref 0.7–4.0)
MCH: 28.5 pg (ref 26.0–34.0)
MCHC: 33.1 g/dL (ref 30.0–36.0)
MCV: 86.2 fL (ref 80.0–100.0)
Monocytes Absolute: 0.9 10*3/uL (ref 0.1–1.0)
Monocytes Relative: 12 %
Neutro Abs: 6.3 10*3/uL (ref 1.7–7.7)
Neutrophils Relative %: 81 %
Platelets: 196 10*3/uL (ref 150–400)
RBC: 4.42 MIL/uL (ref 4.22–5.81)
RDW: 12.3 % (ref 11.5–15.5)
WBC: 7.7 10*3/uL (ref 4.0–10.5)
nRBC: 0 % (ref 0.0–0.2)

## 2020-05-25 LAB — BASIC METABOLIC PANEL - CANCER CENTER ONLY
Anion gap: 14 (ref 5–15)
BUN: 22 mg/dL (ref 8–23)
CO2: 34 mmol/L — ABNORMAL HIGH (ref 22–32)
Calcium: 9.7 mg/dL (ref 8.9–10.3)
Chloride: 95 mmol/L — ABNORMAL LOW (ref 98–111)
Creatinine: 1.72 mg/dL — ABNORMAL HIGH (ref 0.61–1.24)
GFR, Estimated: 42 mL/min — ABNORMAL LOW (ref >60)
Glucose, Bld: 103 mg/dL — ABNORMAL HIGH (ref 70–99)
Potassium: 3.5 mmol/L (ref 3.5–5.1)
Sodium: 143 mmol/L (ref 135–145)

## 2020-05-26 ENCOUNTER — Inpatient Hospital Stay: Payer: Medicare HMO | Admitting: Dietician

## 2020-05-26 ENCOUNTER — Other Ambulatory Visit: Payer: Self-pay | Admitting: Radiation Oncology

## 2020-05-26 ENCOUNTER — Telehealth: Payer: Self-pay

## 2020-05-26 ENCOUNTER — Ambulatory Visit
Admission: RE | Admit: 2020-05-26 | Discharge: 2020-05-26 | Disposition: A | Discharge status: Home / Self Care | Payer: Medicare HMO | Source: Ambulatory Visit | Attending: Radiation Oncology | Admitting: Radiation Oncology

## 2020-05-26 DIAGNOSIS — C01 Malignant neoplasm of base of tongue: Secondary | ICD-10-CM

## 2020-05-26 DIAGNOSIS — Z510 Encounter for antineoplastic radiation therapy: Secondary | ICD-10-CM | POA: Diagnosis not present

## 2020-05-26 NOTE — Progress Notes (Signed)
Nutrition Follow-up:  Patient with tongue cancer. He is receiving radiation therapy.   Met with patient in dressing room prior to treatment. He reports his appetite is "so so" Patient reports painful swallowing and dry mouth. He is using mouth rinse. Patient reports eating [employer] on Friday, had a "few bites" of chicken. He ate roast beef sandwich from [employer] on Saturday with a Sprite. Patient reports some pain with drinking soda. Today patient has consumed 1 vanilla Ensure. He is drinking water, unsure of how much.   Medications: reviewed  Labs: reviewed  Anthropometrics: Weight 143.2 lbs today stable from 143.4 lbs on 5/3  Estimated Energy Needs  Kcals: 2175-2375 Protein: 93-110 Fluid: 2.4 L  NUTRITION DIAGNOSIS: Unintended weight loss continues   INTERVENTION:  Discussed the importance of adequate intake of calories, protein, fluids to maintain weights/strength and nutrition during treatment Discussed placement of feeding tube to help meet nutrition/hydration needs  Patient agreeable to feeding tube today, in-basket sent to MD, nurse navigator, and RN Reviewed soft moist high protein foods Continue drinking Ensure, encouraged 5-6 daily Continue mouth rinse several times daily   MONITORING, EVALUATION, GOAL: weight trends, intake, feeding tube placement  Once tube is placed, recommend -Osmolite 1.5 - Give 1.5 cartons via tube four times daily. Flush tube with 60 ml water before and after each feeding. Drink orally or give via tube additional 710 ml (3 cups) daily.   Provides 2130 kcal, 89.4 grams protein, 2276 ml total water. Meets >90% of estimated nutrition needs  NEXT VISIT: Wednesday May 18

## 2020-05-26 NOTE — Telephone Encounter (Signed)
Left VM on both patient's home phone and cell phone to see how patient was doing, and ensure that he planned to come for his radiation treatment today. Informed him I could get transportation assistance set up if he'd prefer. Provided direct call back number, and requested return call with update.

## 2020-05-27 ENCOUNTER — Inpatient Hospital Stay: Payer: Medicare HMO | Admitting: Nutrition

## 2020-05-27 ENCOUNTER — Inpatient Hospital Stay: Payer: Medicare HMO

## 2020-05-27 ENCOUNTER — Ambulatory Visit
Admission: RE | Admit: 2020-05-27 | Discharge: 2020-05-27 | Disposition: A | Discharge status: Home / Self Care | Payer: Medicare HMO | Source: Ambulatory Visit | Attending: Radiation Oncology | Admitting: Radiation Oncology

## 2020-05-27 ENCOUNTER — Other Ambulatory Visit: Payer: Self-pay

## 2020-05-27 VITALS — BP 96/64 | HR 56 | Temp 97.9°F | Resp 18

## 2020-05-27 DIAGNOSIS — Z510 Encounter for antineoplastic radiation therapy: Secondary | ICD-10-CM | POA: Diagnosis not present

## 2020-05-27 DIAGNOSIS — C01 Malignant neoplasm of base of tongue: Secondary | ICD-10-CM | POA: Diagnosis not present

## 2020-05-27 DIAGNOSIS — E860 Dehydration: Secondary | ICD-10-CM | POA: Diagnosis not present

## 2020-05-27 MED ORDER — SODIUM CHLORIDE 0.9 % IV SOLN
Freq: Once | INTRAVENOUS | Status: AC
  Filled 2020-05-27: qty 250

## 2020-05-28 ENCOUNTER — Ambulatory Visit
Admission: RE | Admit: 2020-05-28 | Discharge: 2020-05-28 | Disposition: A | Discharge status: Home / Self Care | Payer: Medicare HMO | Source: Ambulatory Visit | Attending: Radiation Oncology | Admitting: Radiation Oncology

## 2020-05-28 DIAGNOSIS — Z510 Encounter for antineoplastic radiation therapy: Secondary | ICD-10-CM | POA: Diagnosis not present

## 2020-05-28 DIAGNOSIS — C01 Malignant neoplasm of base of tongue: Secondary | ICD-10-CM | POA: Diagnosis not present

## 2020-05-29 ENCOUNTER — Ambulatory Visit
Admission: RE | Admit: 2020-05-29 | Discharge: 2020-05-29 | Disposition: A | Discharge status: Home / Self Care | Payer: Medicare HMO | Source: Ambulatory Visit | Attending: Radiation Oncology | Admitting: Radiation Oncology

## 2020-05-29 ENCOUNTER — Other Ambulatory Visit: Payer: Self-pay

## 2020-05-29 ENCOUNTER — Inpatient Hospital Stay: Payer: Medicare HMO

## 2020-05-29 ENCOUNTER — Other Ambulatory Visit (HOSPITAL_COMMUNITY)
Admission: RE | Admit: 2020-05-29 | Discharge: 2020-05-29 | Disposition: A | Discharge status: Home / Self Care | Payer: Medicare HMO | Source: Ambulatory Visit | Attending: Radiation Oncology | Admitting: Radiation Oncology

## 2020-05-29 ENCOUNTER — Other Ambulatory Visit: Payer: Self-pay | Admitting: Radiology

## 2020-05-29 VITALS — BP 109/49 | HR 56 | Temp 98.0°F | Resp 16

## 2020-05-29 DIAGNOSIS — E860 Dehydration: Secondary | ICD-10-CM | POA: Diagnosis not present

## 2020-05-29 DIAGNOSIS — Z01812 Encounter for preprocedural laboratory examination: Secondary | ICD-10-CM | POA: Diagnosis not present

## 2020-05-29 DIAGNOSIS — Z20822 Contact with and (suspected) exposure to covid-19: Secondary | ICD-10-CM | POA: Insufficient documentation

## 2020-05-29 DIAGNOSIS — Z510 Encounter for antineoplastic radiation therapy: Secondary | ICD-10-CM | POA: Diagnosis not present

## 2020-05-29 DIAGNOSIS — C01 Malignant neoplasm of base of tongue: Secondary | ICD-10-CM | POA: Diagnosis not present

## 2020-05-29 MED ORDER — SODIUM CHLORIDE 0.9 % IV SOLN
Freq: Once | INTRAVENOUS | Status: AC
  Filled 2020-05-29: qty 250

## 2020-05-29 NOTE — Patient Instructions (Signed)
Rehydration, Adult Rehydration is the replacement of body fluids, salts, and minerals (electrolytes) that are lost during dehydration. Dehydration is when there is not enough water or other fluids in the body. This happens when you lose more fluids than you take in. Common causes of dehydration include:  Not drinking enough fluids. This can occur when you are ill or doing activities that require a lot of energy, especially in hot weather.  Conditions that cause loss of water or other fluids, such as diarrhea, vomiting, sweating, or urinating a lot.  Other illnesses, such as fever or infection.  Certain medicines, such as those that remove excess fluid from the body (diuretics). Symptoms of mild or moderate dehydration may include thirst, dry lips and mouth, and dizziness. Symptoms of severe dehydration may include increased heart rate, confusion, fainting, and not urinating. For severe dehydration, you may need to get fluids through an IV at the hospital. For mild or moderate dehydration, you can usually rehydrate at home by drinking certain fluids as told by your health care provider. What are the risks? Generally, rehydration is safe. However, taking in too much fluid (overhydration) can be a problem. This is rare. Overhydration can cause an electrolyte imbalance, kidney failure, or a decrease in salt (sodium) levels in the body. Supplies needed You will need an oral rehydration solution (ORS) if your health care provider tells you to use one. This is a drink to treat dehydration. It can be found in pharmacies and retail stores. How to rehydrate Fluids Follow instructions from your health care provider for rehydration. The kind of fluid and the amount you should drink depend on your condition. In general, you should choose drinks that you prefer.  If told by your health care provider, drink an ORS. ? Make an ORS by following instructions on the package. ? Start by drinking small amounts,  about  cup (120 mL) every 5-10 minutes. ? Slowly increase how much you drink until you have taken the amount recommended by your health care provider.  Drink enough clear fluids to keep your urine pale yellow. If you were told to drink an ORS, finish it first, then start slowly drinking other clear fluids. Drink fluids such as: ? Water. This includes sparkling water and flavored water. Drinking only water can lead to having too little sodium in your body (hyponatremia). Follow the advice of your health care provider. ? Water from ice chips you suck on. ? Fruit juice with water you add to it (diluted). ? Sports drinks. ? Hot or cold herbal teas. ? Broth-based soups. ? Milk or milk products. Food Follow instructions from your health care provider about what to eat while you rehydrate. Your health care provider may recommend that you slowly begin eating regular foods in small amounts.  Eat foods that contain a healthy balance of electrolytes, such as bananas, oranges, potatoes, tomatoes, and spinach.  Avoid foods that are greasy or contain a lot of sugar. In some cases, you may get nutrition through a feeding tube that is passed through your nose and into your stomach (nasogastric tube, or NG tube). This may be done if you have uncontrolled vomiting or diarrhea.   Beverages to avoid Certain beverages may make dehydration worse. While you rehydrate, avoid drinking alcohol.   How to tell if you are recovering from dehydration You may be recovering from dehydration if:  You are urinating more often than before you started rehydrating.  Your urine is pale yellow.  Your energy level   improves.  You vomit less frequently.  You have diarrhea less frequently.  Your appetite improves or returns to normal.  You feel less dizzy or less light-headed.  Your skin tone and color start to look more normal. Follow these instructions at home:  Take over-the-counter and prescription medicines only  as told by your health care provider.  Do not take sodium tablets. Doing this can lead to having too much sodium in your body (hypernatremia). Contact a health care provider if:  You continue to have symptoms of mild or moderate dehydration, such as: ? Thirst. ? Dry lips. ? Slightly dry mouth. ? Dizziness. ? Dark urine or less urine than normal. ? Muscle cramps.  You continue to vomit or have diarrhea. Get help right away if you:  Have symptoms of dehydration that get worse.  Have a fever.  Have a severe headache.  Have been vomiting and the following happens: ? Your vomiting gets worse or does not go away. ? Your vomit includes blood or green matter (bile). ? You cannot eat or drink without vomiting.  Have problems with urination or bowel movements, such as: ? Diarrhea that gets worse or does not go away. ? Blood in your stool (feces). This may cause stool to look black and tarry. ? Not urinating, or urinating only a small amount of very dark urine, within 6-8 hours.  Have trouble breathing.  Have symptoms that get worse with treatment. These symptoms may represent a serious problem that is an emergency. Do not wait to see if the symptoms will go away. Get medical help right away. Call your local emergency services (911 in the [place] Do not drive yourself to the hospital. Summary  Rehydration is the replacement of body fluids and minerals (electrolytes) that are lost during dehydration.  Follow instructions from your health care provider for rehydration. The kind of fluid and amount you should drink depend on your condition.  Slowly increase how much you drink until you have taken the amount recommended by your health care provider.  Contact your health care provider if you continue to show signs of mild or moderate dehydration. This information is not intended to replace advice given to you by your health care provider. Make sure you discuss any questions you have with  your health care provider. Document Revised: 03/06/2019 Document Reviewed: 01/14/2019 Elsevier Patient Education  2021 Elsevier Inc.  

## 2020-05-29 NOTE — Progress Notes (Unsigned)
Per Sandi Mealy, PA okay to administer 1L over 1 hour.

## 2020-05-30 LAB — SARS CORONAVIRUS 2 (TAT 6-24 HRS): SARS Coronavirus 2: NEGATIVE

## 2020-06-01 ENCOUNTER — Inpatient Hospital Stay: Payer: Medicare HMO

## 2020-06-01 ENCOUNTER — Telehealth: Payer: Self-pay

## 2020-06-01 ENCOUNTER — Ambulatory Visit: Payer: Medicare HMO

## 2020-06-01 NOTE — Telephone Encounter (Signed)
Noticed patient had not been checked in for radiation appointment. Called and spoke with patient. He stated he didn't plan to come for radiation or IVFs because he "just didn't feel up for coming". Encouraged patient to keep appointments moving forward because too many interruptions in his radiation treatment can decrease efficacy of treatment, and since he's having difficulty getting enough to eat/drink he needs to keep IVF appointments to avoid dehydration/hospitalization. He verbalized understanding and agreement, and confirmed he would attend his appointments tomorrow. Informed him that his PEG procedure originally on 5/17 had now been moved to 5/23at 10:00 due to transportation issues. He verbalized understanding of appointment changes. Patient denied any other needs at this time, but he knows to call me back directly should something occur in the future.   Called and spoke with transportation staff member to update pick up times for patient's appointment tomorrow.

## 2020-06-02 ENCOUNTER — Inpatient Hospital Stay: Payer: Medicare HMO

## 2020-06-02 ENCOUNTER — Ambulatory Visit: Payer: Medicare HMO

## 2020-06-02 ENCOUNTER — Other Ambulatory Visit (HOSPITAL_COMMUNITY): Payer: Medicare HMO

## 2020-06-02 ENCOUNTER — Other Ambulatory Visit: Payer: Self-pay

## 2020-06-02 ENCOUNTER — Ambulatory Visit (HOSPITAL_COMMUNITY): Payer: Medicare HMO

## 2020-06-02 VITALS — BP 122/52 | HR 49 | Resp 16

## 2020-06-02 DIAGNOSIS — R634 Abnormal weight loss: Secondary | ICD-10-CM

## 2020-06-02 DIAGNOSIS — C01 Malignant neoplasm of base of tongue: Secondary | ICD-10-CM | POA: Diagnosis not present

## 2020-06-02 DIAGNOSIS — E860 Dehydration: Secondary | ICD-10-CM | POA: Diagnosis not present

## 2020-06-02 DIAGNOSIS — K148 Other diseases of tongue: Secondary | ICD-10-CM

## 2020-06-02 LAB — CBC WITH DIFFERENTIAL/PLATELET
Abs Immature Granulocytes: 0.02 10*3/uL (ref 0.00–0.07)
Basophils Absolute: 0.1 10*3/uL (ref 0.0–0.1)
Basophils Relative: 1 %
Eosinophils Absolute: 0.2 10*3/uL (ref 0.0–0.5)
Eosinophils Relative: 3 %
HCT: 35 % — ABNORMAL LOW (ref 39.0–52.0)
Hemoglobin: 11.7 g/dL — ABNORMAL LOW (ref 13.0–17.0)
Immature Granulocytes: 0 %
Lymphocytes Relative: 5 %
Lymphs Abs: 0.3 10*3/uL — ABNORMAL LOW (ref 0.7–4.0)
MCH: 28.5 pg (ref 26.0–34.0)
MCHC: 33.4 g/dL (ref 30.0–36.0)
MCV: 85.4 fL (ref 80.0–100.0)
Monocytes Absolute: 0.7 10*3/uL (ref 0.1–1.0)
Monocytes Relative: 12 %
Neutro Abs: 4.6 10*3/uL (ref 1.7–7.7)
Neutrophils Relative %: 79 %
Platelets: 163 10*3/uL (ref 150–400)
RBC: 4.1 MIL/uL — ABNORMAL LOW (ref 4.22–5.81)
RDW: 12.6 % (ref 11.5–15.5)
WBC: 5.9 10*3/uL (ref 4.0–10.5)
nRBC: 0 % (ref 0.0–0.2)

## 2020-06-02 LAB — BASIC METABOLIC PANEL - CANCER CENTER ONLY
Anion gap: 18 — ABNORMAL HIGH (ref 5–15)
BUN: 18 mg/dL (ref 8–23)
CO2: 27 mmol/L (ref 22–32)
Calcium: 9.6 mg/dL (ref 8.9–10.3)
Chloride: 95 mmol/L — ABNORMAL LOW (ref 98–111)
Creatinine: 1.46 mg/dL — ABNORMAL HIGH (ref 0.61–1.24)
GFR, Estimated: 51 mL/min — ABNORMAL LOW (ref >60)
Glucose, Bld: 74 mg/dL (ref 70–99)
Potassium: 3.5 mmol/L (ref 3.5–5.1)
Sodium: 140 mmol/L (ref 135–145)

## 2020-06-02 MED ORDER — SODIUM CHLORIDE 0.9 % IV SOLN
Freq: Once | INTRAVENOUS | Status: AC
  Filled 2020-06-02: qty 250

## 2020-06-02 NOTE — Patient Instructions (Signed)
Rehydration, Adult Rehydration is the replacement of body fluids, salts, and minerals (electrolytes) that are lost during dehydration. Dehydration is when there is not enough water or other fluids in the body. This happens when you lose more fluids than you take in. Common causes of dehydration include:  Not drinking enough fluids. This can occur when you are ill or doing activities that require a lot of energy, especially in hot weather.  Conditions that cause loss of water or other fluids, such as diarrhea, vomiting, sweating, or urinating a lot.  Other illnesses, such as fever or infection.  Certain medicines, such as those that remove excess fluid from the body (diuretics). Symptoms of mild or moderate dehydration may include thirst, dry lips and mouth, and dizziness. Symptoms of severe dehydration may include increased heart rate, confusion, fainting, and not urinating. For severe dehydration, you may need to get fluids through an IV at the hospital. For mild or moderate dehydration, you can usually rehydrate at home by drinking certain fluids as told by your health care provider. What are the risks? Generally, rehydration is safe. However, taking in too much fluid (overhydration) can be a problem. This is rare. Overhydration can cause an electrolyte imbalance, kidney failure, or a decrease in salt (sodium) levels in the body. Supplies needed You will need an oral rehydration solution (ORS) if your health care provider tells you to use one. This is a drink to treat dehydration. It can be found in pharmacies and retail stores. How to rehydrate Fluids Follow instructions from your health care provider for rehydration. The kind of fluid and the amount you should drink depend on your condition. In general, you should choose drinks that you prefer.  If told by your health care provider, drink an ORS. ? Make an ORS by following instructions on the package. ? Start by drinking small amounts,  about  cup (120 mL) every 5-10 minutes. ? Slowly increase how much you drink until you have taken the amount recommended by your health care provider.  Drink enough clear fluids to keep your urine pale yellow. If you were told to drink an ORS, finish it first, then start slowly drinking other clear fluids. Drink fluids such as: ? Water. This includes sparkling water and flavored water. Drinking only water can lead to having too little sodium in your body (hyponatremia). Follow the advice of your health care provider. ? Water from ice chips you suck on. ? Fruit juice with water you add to it (diluted). ? Sports drinks. ? Hot or cold herbal teas. ? Broth-based soups. ? Milk or milk products. Food Follow instructions from your health care provider about what to eat while you rehydrate. Your health care provider may recommend that you slowly begin eating regular foods in small amounts.  Eat foods that contain a healthy balance of electrolytes, such as bananas, oranges, potatoes, tomatoes, and spinach.  Avoid foods that are greasy or contain a lot of sugar. In some cases, you may get nutrition through a feeding tube that is passed through your nose and into your stomach (nasogastric tube, or NG tube). This may be done if you have uncontrolled vomiting or diarrhea.   Beverages to avoid Certain beverages may make dehydration worse. While you rehydrate, avoid drinking alcohol.   How to tell if you are recovering from dehydration You may be recovering from dehydration if:  You are urinating more often than before you started rehydrating.  Your urine is pale yellow.  Your energy level   improves.  You vomit less frequently.  You have diarrhea less frequently.  Your appetite improves or returns to normal.  You feel less dizzy or less light-headed.  Your skin tone and color start to look more normal. Follow these instructions at home:  Take over-the-counter and prescription medicines only  as told by your health care provider.  Do not take sodium tablets. Doing this can lead to having too much sodium in your body (hypernatremia). Contact a health care provider if:  You continue to have symptoms of mild or moderate dehydration, such as: ? Thirst. ? Dry lips. ? Slightly dry mouth. ? Dizziness. ? Dark urine or less urine than normal. ? Muscle cramps.  You continue to vomit or have diarrhea. Get help right away if you:  Have symptoms of dehydration that get worse.  Have a fever.  Have a severe headache.  Have been vomiting and the following happens: ? Your vomiting gets worse or does not go away. ? Your vomit includes blood or green matter (bile). ? You cannot eat or drink without vomiting.  Have problems with urination or bowel movements, such as: ? Diarrhea that gets worse or does not go away. ? Blood in your stool (feces). This may cause stool to look black and tarry. ? Not urinating, or urinating only a small amount of very dark urine, within 6-8 hours.  Have trouble breathing.  Have symptoms that get worse with treatment. These symptoms may represent a serious problem that is an emergency. Do not wait to see if the symptoms will go away. Get medical help right away. Call your local emergency services (911 in the [place] Do not drive yourself to the hospital. Summary  Rehydration is the replacement of body fluids and minerals (electrolytes) that are lost during dehydration.  Follow instructions from your health care provider for rehydration. The kind of fluid and amount you should drink depend on your condition.  Slowly increase how much you drink until you have taken the amount recommended by your health care provider.  Contact your health care provider if you continue to show signs of mild or moderate dehydration. This information is not intended to replace advice given to you by your health care provider. Make sure you discuss any questions you have with  your health care provider. Document Revised: 03/06/2019 Document Reviewed: 01/14/2019 Elsevier Patient Education  2021 Elsevier Inc.  

## 2020-06-03 ENCOUNTER — Ambulatory Visit
Admission: RE | Admit: 2020-06-03 | Discharge: 2020-06-03 | Disposition: A | Discharge status: Home / Self Care | Payer: Medicare HMO | Source: Ambulatory Visit | Attending: Radiation Oncology | Admitting: Radiation Oncology

## 2020-06-03 ENCOUNTER — Inpatient Hospital Stay: Payer: Medicare HMO

## 2020-06-03 ENCOUNTER — Inpatient Hospital Stay: Payer: Medicare HMO | Admitting: Nutrition

## 2020-06-03 ENCOUNTER — Inpatient Hospital Stay: Payer: Medicare HMO | Admitting: General Practice

## 2020-06-03 VITALS — BP 116/81 | HR 71 | Temp 97.7°F | Resp 18

## 2020-06-03 DIAGNOSIS — C01 Malignant neoplasm of base of tongue: Secondary | ICD-10-CM

## 2020-06-03 DIAGNOSIS — E860 Dehydration: Secondary | ICD-10-CM | POA: Diagnosis not present

## 2020-06-03 DIAGNOSIS — Z510 Encounter for antineoplastic radiation therapy: Secondary | ICD-10-CM | POA: Diagnosis not present

## 2020-06-03 MED ORDER — SODIUM CHLORIDE 0.9 % IV SOLN
Freq: Once | INTRAVENOUS | Status: AC
  Filled 2020-06-03: qty 250

## 2020-06-03 NOTE — Progress Notes (Signed)
Follow up in infusion during IVF. Last weight documented was 143.2 pounds on May 10.. Patient declines ALL nutrition supplements but says he will try to take a few swallows every couple of hours. He thinks he ate an [employer] sandwich or some [employer]. He cannot remember what he has eaten over the last couple days. He agrees to try to eat some peanutbutter crackers and some applesauce while receiving IVF.  He is drinking a carbonated beverage which he says burns his throat a bit.  He states he has only ever consumed 1 meal a day. Educated patient he would receive tube feeding instruction next week and he stated "Are they still doing that? " referring to feeding tube placement.  He has requested [name] to come for nutrition education about the feeding tube.  Nutrition diagnosis: Unintended weight loss continues.  Intervention: Educated patient to try to increase oral intake.  Reviewed some options for patient to try to eat however I am not sure he will remember what we discussed. He declines oral nutrition supplements. Concerned about patient's ability to give tube feeding correctly and consistently secondary to memory issues.  Hopefully [name] will be able to assist.  Monitoring, evaluation, goals: Monitor weights, intake and feeding tube placement.  Next visit: Wednesday, May 25 with [name].  **Disclaimer: This note was dictated with voice recognition software. Similar sounding words can inadvertently be transcribed and this note may contain transcription errors which may not have been corrected upon publication of note.**

## 2020-06-03 NOTE — Progress Notes (Signed)
[place] CSW Progress Notes  Met w patient in infusion, reviewed his schedule and arrangements for transportation w [employer].  CSW advised him of the "no showRunner, broadcasting/film/video as he is in jeopardy of losing his ability to access this service.  He has had two "no shows", if he refuses another scheduled ride, he will be dismissed from the program and unable to access [facility] rides for 6 months.  He states it is difficult to keep track of appointments and that he has a hard time waking up in the morning. With his permission, CSW spoke w his friend [name] who has kindly offered to contact him w a "wake up" call.  She says he is accustomed to staying up all night and sleeping all day - since he stopped working, his sleep/wake cycle has been significantly disrupted.  She is willing to help him w reminder calls for the next few days and will also transport him home from his upcoming procedure.  She does feel that he would benefit from more support at home and any programs designed to help seniors remain in their home w any needed modifications.  He has struggled with things like keeping lawn mowed and home maintained.  CSW will talk with him about any available programs to support seniors in their homes.  Edwyna Shell, LCSW Clinical Social Worker Phone:  [phone]

## 2020-06-03 NOTE — Patient Instructions (Signed)
Rehydration, Adult Rehydration is the replacement of body fluids, salts, and minerals (electrolytes) that are lost during dehydration. Dehydration is when there is not enough water or other fluids in the body. This happens when you lose more fluids than you take in. Common causes of dehydration include:  Not drinking enough fluids. This can occur when you are ill or doing activities that require a lot of energy, especially in hot weather.  Conditions that cause loss of water or other fluids, such as diarrhea, vomiting, sweating, or urinating a lot.  Other illnesses, such as fever or infection.  Certain medicines, such as those that remove excess fluid from the body (diuretics). Symptoms of mild or moderate dehydration may include thirst, dry lips and mouth, and dizziness. Symptoms of severe dehydration may include increased heart rate, confusion, fainting, and not urinating. For severe dehydration, you may need to get fluids through an IV at the hospital. For mild or moderate dehydration, you can usually rehydrate at home by drinking certain fluids as told by your health care provider. What are the risks? Generally, rehydration is safe. However, taking in too much fluid (overhydration) can be a problem. This is rare. Overhydration can cause an electrolyte imbalance, kidney failure, or a decrease in salt (sodium) levels in the body. Supplies needed You will need an oral rehydration solution (ORS) if your health care provider tells you to use one. This is a drink to treat dehydration. It can be found in pharmacies and retail stores. How to rehydrate Fluids Follow instructions from your health care provider for rehydration. The kind of fluid and the amount you should drink depend on your condition. In general, you should choose drinks that you prefer.  If told by your health care provider, drink an ORS. ? Make an ORS by following instructions on the package. ? Start by drinking small amounts,  about  cup (120 mL) every 5-10 minutes. ? Slowly increase how much you drink until you have taken the amount recommended by your health care provider.  Drink enough clear fluids to keep your urine pale yellow. If you were told to drink an ORS, finish it first, then start slowly drinking other clear fluids. Drink fluids such as: ? Water. This includes sparkling water and flavored water. Drinking only water can lead to having too little sodium in your body (hyponatremia). Follow the advice of your health care provider. ? Water from ice chips you suck on. ? Fruit juice with water you add to it (diluted). ? Sports drinks. ? Hot or cold herbal teas. ? Broth-based soups. ? Milk or milk products. Food Follow instructions from your health care provider about what to eat while you rehydrate. Your health care provider may recommend that you slowly begin eating regular foods in small amounts.  Eat foods that contain a healthy balance of electrolytes, such as bananas, oranges, potatoes, tomatoes, and spinach.  Avoid foods that are greasy or contain a lot of sugar. In some cases, you may get nutrition through a feeding tube that is passed through your nose and into your stomach (nasogastric tube, or NG tube). This may be done if you have uncontrolled vomiting or diarrhea.   Beverages to avoid Certain beverages may make dehydration worse. While you rehydrate, avoid drinking alcohol.   How to tell if you are recovering from dehydration You may be recovering from dehydration if:  You are urinating more often than before you started rehydrating.  Your urine is pale yellow.  Your energy level   improves.  You vomit less frequently.  You have diarrhea less frequently.  Your appetite improves or returns to normal.  You feel less dizzy or less light-headed.  Your skin tone and color start to look more normal. Follow these instructions at home:  Take over-the-counter and prescription medicines only  as told by your health care provider.  Do not take sodium tablets. Doing this can lead to having too much sodium in your body (hypernatremia). Contact a health care provider if:  You continue to have symptoms of mild or moderate dehydration, such as: ? Thirst. ? Dry lips. ? Slightly dry mouth. ? Dizziness. ? Dark urine or less urine than normal. ? Muscle cramps.  You continue to vomit or have diarrhea. Get help right away if you:  Have symptoms of dehydration that get worse.  Have a fever.  Have a severe headache.  Have been vomiting and the following happens: ? Your vomiting gets worse or does not go away. ? Your vomit includes blood or green matter (bile). ? You cannot eat or drink without vomiting.  Have problems with urination or bowel movements, such as: ? Diarrhea that gets worse or does not go away. ? Blood in your stool (feces). This may cause stool to look black and tarry. ? Not urinating, or urinating only a small amount of very dark urine, within 6-8 hours.  Have trouble breathing.  Have symptoms that get worse with treatment. These symptoms may represent a serious problem that is an emergency. Do not wait to see if the symptoms will go away. Get medical help right away. Call your local emergency services (911 in the [place] Do not drive yourself to the hospital. Summary  Rehydration is the replacement of body fluids and minerals (electrolytes) that are lost during dehydration.  Follow instructions from your health care provider for rehydration. The kind of fluid and amount you should drink depend on your condition.  Slowly increase how much you drink until you have taken the amount recommended by your health care provider.  Contact your health care provider if you continue to show signs of mild or moderate dehydration. This information is not intended to replace advice given to you by your health care provider. Make sure you discuss any questions you have with  your health care provider. Document Revised: 03/06/2019 Document Reviewed: 01/14/2019 Elsevier Patient Education  2021 Elsevier Inc.  

## 2020-06-04 ENCOUNTER — Ambulatory Visit: Payer: Medicare HMO

## 2020-06-04 ENCOUNTER — Other Ambulatory Visit: Payer: Self-pay | Admitting: Radiology

## 2020-06-04 ENCOUNTER — Ambulatory Visit
Admission: RE | Admit: 2020-06-04 | Discharge: 2020-06-04 | Disposition: A | Discharge status: Home / Self Care | Payer: Medicare HMO | Source: Ambulatory Visit | Attending: Radiation Oncology | Admitting: Radiation Oncology

## 2020-06-04 ENCOUNTER — Other Ambulatory Visit: Payer: Self-pay

## 2020-06-04 DIAGNOSIS — Z510 Encounter for antineoplastic radiation therapy: Secondary | ICD-10-CM | POA: Diagnosis not present

## 2020-06-04 DIAGNOSIS — C01 Malignant neoplasm of base of tongue: Secondary | ICD-10-CM | POA: Diagnosis not present

## 2020-06-05 ENCOUNTER — Ambulatory Visit: Payer: Medicare HMO

## 2020-06-05 ENCOUNTER — Ambulatory Visit
Admission: RE | Admit: 2020-06-05 | Discharge: 2020-06-05 | Disposition: A | Discharge status: Home / Self Care | Payer: Medicare HMO | Source: Ambulatory Visit | Attending: Radiation Oncology | Admitting: Radiation Oncology

## 2020-06-05 ENCOUNTER — Inpatient Hospital Stay: Payer: Medicare HMO

## 2020-06-05 VITALS — BP 107/52 | HR 55 | Temp 96.9°F | Resp 18

## 2020-06-05 DIAGNOSIS — Z510 Encounter for antineoplastic radiation therapy: Secondary | ICD-10-CM | POA: Diagnosis not present

## 2020-06-05 DIAGNOSIS — E860 Dehydration: Secondary | ICD-10-CM | POA: Diagnosis not present

## 2020-06-05 DIAGNOSIS — C01 Malignant neoplasm of base of tongue: Secondary | ICD-10-CM | POA: Diagnosis not present

## 2020-06-05 MED ORDER — SODIUM CHLORIDE 0.9 % IV SOLN
Freq: Once | INTRAVENOUS | Status: AC
  Filled 2020-06-05: qty 250

## 2020-06-05 NOTE — Progress Notes (Signed)
Oncology Nurse Navigator Documentation  I met with Jerry Cook today after his radiation treatment and walked him upstairs to receive his IVF in infusion. I reminded him that his PEG was scheduled for this upcoming Monday 5/23 at 10:00 am. He verbalized to me that he has "thought about it and no longer wishes to have the PEG placed". I discussed with him that he is not getting enough nutrition to sustain him through his treatment for head and neck cancer and he risked being hospitalized without the PEG tube. He again verbalized to me that he does not want to have the feeding tube placed. I've encouraged him to follow the recommendations of the Dietician and increase his intake of water, ensure, and soft foods as tolerated. He voiced his understanding. I have notified Jerry Cook, Jerry Cook RD, Jerry Cook RD, and Jerry Cook SW of his refusal regarding PEG placement and I have also made IR aware. Jerry Cook knows to call me if there is anything that I can help him with.   [name] RN, BSN, OCN Head & Neck Oncology Nurse [place] at [facility] Phone # [phone]  Fax # [phone]

## 2020-06-05 NOTE — Patient Instructions (Signed)
Rehydration, Adult Rehydration is the replacement of body fluids, salts, and minerals (electrolytes) that are lost during dehydration. Dehydration is when there is not enough water or other fluids in the body. This happens when you lose more fluids than you take in. Common causes of dehydration include:  Not drinking enough fluids. This can occur when you are ill or doing activities that require a lot of energy, especially in hot weather.  Conditions that cause loss of water or other fluids, such as diarrhea, vomiting, sweating, or urinating a lot.  Other illnesses, such as fever or infection.  Certain medicines, such as those that remove excess fluid from the body (diuretics). Symptoms of mild or moderate dehydration may include thirst, dry lips and mouth, and dizziness. Symptoms of severe dehydration may include increased heart rate, confusion, fainting, and not urinating. For severe dehydration, you may need to get fluids through an IV at the hospital. For mild or moderate dehydration, you can usually rehydrate at home by drinking certain fluids as told by your health care provider. What are the risks? Generally, rehydration is safe. However, taking in too much fluid (overhydration) can be a problem. This is rare. Overhydration can cause an electrolyte imbalance, kidney failure, or a decrease in salt (sodium) levels in the body. Supplies needed You will need an oral rehydration solution (ORS) if your health care provider tells you to use one. This is a drink to treat dehydration. It can be found in pharmacies and retail stores. How to rehydrate Fluids Follow instructions from your health care provider for rehydration. The kind of fluid and the amount you should drink depend on your condition. In general, you should choose drinks that you prefer.  If told by your health care provider, drink an ORS. ? Make an ORS by following instructions on the package. ? Start by drinking small amounts,  about  cup (120 mL) every 5-10 minutes. ? Slowly increase how much you drink until you have taken the amount recommended by your health care provider.  Drink enough clear fluids to keep your urine pale yellow. If you were told to drink an ORS, finish it first, then start slowly drinking other clear fluids. Drink fluids such as: ? Water. This includes sparkling water and flavored water. Drinking only water can lead to having too little sodium in your body (hyponatremia). Follow the advice of your health care provider. ? Water from ice chips you suck on. ? Fruit juice with water you add to it (diluted). ? Sports drinks. ? Hot or cold herbal teas. ? Broth-based soups. ? Milk or milk products. Food Follow instructions from your health care provider about what to eat while you rehydrate. Your health care provider may recommend that you slowly begin eating regular foods in small amounts.  Eat foods that contain a healthy balance of electrolytes, such as bananas, oranges, potatoes, tomatoes, and spinach.  Avoid foods that are greasy or contain a lot of sugar. In some cases, you may get nutrition through a feeding tube that is passed through your nose and into your stomach (nasogastric tube, or NG tube). This may be done if you have uncontrolled vomiting or diarrhea.   Beverages to avoid Certain beverages may make dehydration worse. While you rehydrate, avoid drinking alcohol.   How to tell if you are recovering from dehydration You may be recovering from dehydration if:  You are urinating more often than before you started rehydrating.  Your urine is pale yellow.  Your energy level   improves.  You vomit less frequently.  You have diarrhea less frequently.  Your appetite improves or returns to normal.  You feel less dizzy or less light-headed.  Your skin tone and color start to look more normal. Follow these instructions at home:  Take over-the-counter and prescription medicines only  as told by your health care provider.  Do not take sodium tablets. Doing this can lead to having too much sodium in your body (hypernatremia). Contact a health care provider if:  You continue to have symptoms of mild or moderate dehydration, such as: ? Thirst. ? Dry lips. ? Slightly dry mouth. ? Dizziness. ? Dark urine or less urine than normal. ? Muscle cramps.  You continue to vomit or have diarrhea. Get help right away if you:  Have symptoms of dehydration that get worse.  Have a fever.  Have a severe headache.  Have been vomiting and the following happens: ? Your vomiting gets worse or does not go away. ? Your vomit includes blood or green matter (bile). ? You cannot eat or drink without vomiting.  Have problems with urination or bowel movements, such as: ? Diarrhea that gets worse or does not go away. ? Blood in your stool (feces). This may cause stool to look black and tarry. ? Not urinating, or urinating only a small amount of very dark urine, within 6-8 hours.  Have trouble breathing.  Have symptoms that get worse with treatment. These symptoms may represent a serious problem that is an emergency. Do not wait to see if the symptoms will go away. Get medical help right away. Call your local emergency services (911 in the [place] Do not drive yourself to the hospital. Summary  Rehydration is the replacement of body fluids and minerals (electrolytes) that are lost during dehydration.  Follow instructions from your health care provider for rehydration. The kind of fluid and amount you should drink depend on your condition.  Slowly increase how much you drink until you have taken the amount recommended by your health care provider.  Contact your health care provider if you continue to show signs of mild or moderate dehydration. This information is not intended to replace advice given to you by your health care provider. Make sure you discuss any questions you have with  your health care provider. Document Revised: 03/06/2019 Document Reviewed: 01/14/2019 Elsevier Patient Education  2021 Elsevier Inc.  

## 2020-06-08 ENCOUNTER — Inpatient Hospital Stay (HOSPITAL_COMMUNITY): Admission: RE | Admit: 2020-06-08 | Payer: Medicare HMO | Source: Ambulatory Visit

## 2020-06-08 ENCOUNTER — Ambulatory Visit (HOSPITAL_COMMUNITY): Payer: Medicare HMO

## 2020-06-08 ENCOUNTER — Inpatient Hospital Stay: Payer: Medicare HMO

## 2020-06-08 ENCOUNTER — Encounter: Payer: Self-pay | Admitting: Radiation Oncology

## 2020-06-08 ENCOUNTER — Ambulatory Visit
Admission: RE | Admit: 2020-06-08 | Discharge: 2020-06-08 | Disposition: A | Discharge status: Home / Self Care | Payer: Medicare HMO | Source: Ambulatory Visit | Attending: Radiation Oncology | Admitting: Radiation Oncology

## 2020-06-08 ENCOUNTER — Other Ambulatory Visit: Payer: Self-pay

## 2020-06-08 ENCOUNTER — Telehealth: Payer: Self-pay

## 2020-06-08 VITALS — BP 110/97 | HR 86 | Temp 97.7°F | Resp 18

## 2020-06-08 DIAGNOSIS — E860 Dehydration: Secondary | ICD-10-CM | POA: Diagnosis not present

## 2020-06-08 DIAGNOSIS — C01 Malignant neoplasm of base of tongue: Secondary | ICD-10-CM

## 2020-06-08 DIAGNOSIS — R634 Abnormal weight loss: Secondary | ICD-10-CM

## 2020-06-08 DIAGNOSIS — K148 Other diseases of tongue: Secondary | ICD-10-CM

## 2020-06-08 DIAGNOSIS — Z510 Encounter for antineoplastic radiation therapy: Secondary | ICD-10-CM | POA: Diagnosis not present

## 2020-06-08 LAB — CBC WITH DIFFERENTIAL/PLATELET
Abs Immature Granulocytes: 0.02 10*3/uL (ref 0.00–0.07)
Basophils Absolute: 0.1 10*3/uL (ref 0.0–0.1)
Basophils Relative: 1 %
Eosinophils Absolute: 0.2 10*3/uL (ref 0.0–0.5)
Eosinophils Relative: 3 %
HCT: 35.4 % — ABNORMAL LOW (ref 39.0–52.0)
Hemoglobin: 11.9 g/dL — ABNORMAL LOW (ref 13.0–17.0)
Immature Granulocytes: 0 %
Lymphocytes Relative: 4 %
Lymphs Abs: 0.3 10*3/uL — ABNORMAL LOW (ref 0.7–4.0)
MCH: 28.9 pg (ref 26.0–34.0)
MCHC: 33.6 g/dL (ref 30.0–36.0)
MCV: 85.9 fL (ref 80.0–100.0)
Monocytes Absolute: 0.8 10*3/uL (ref 0.1–1.0)
Monocytes Relative: 13 %
Neutro Abs: 5 10*3/uL (ref 1.7–7.7)
Neutrophils Relative %: 79 %
Platelets: 184 10*3/uL (ref 150–400)
RBC: 4.12 MIL/uL — ABNORMAL LOW (ref 4.22–5.81)
RDW: 13 % (ref 11.5–15.5)
WBC: 6.3 10*3/uL (ref 4.0–10.5)
nRBC: 0 % (ref 0.0–0.2)

## 2020-06-08 LAB — BASIC METABOLIC PANEL - CANCER CENTER ONLY
Anion gap: 7 (ref 5–15)
BUN: 13 mg/dL (ref 8–23)
CO2: 36 mmol/L — ABNORMAL HIGH (ref 22–32)
Calcium: 9.2 mg/dL (ref 8.9–10.3)
Chloride: 96 mmol/L — ABNORMAL LOW (ref 98–111)
Creatinine: 1.38 mg/dL — ABNORMAL HIGH (ref 0.61–1.24)
GFR, Estimated: 55 mL/min — ABNORMAL LOW (ref >60)
Glucose, Bld: 97 mg/dL (ref 70–99)
Potassium: 3.2 mmol/L — ABNORMAL LOW (ref 3.5–5.1)
Sodium: 139 mmol/L (ref 135–145)

## 2020-06-08 MED ORDER — SODIUM CHLORIDE 0.9% FLUSH
10.0000 mL | INTRAVENOUS | Status: DC | PRN
Start: 2020-06-08 — End: 2020-06-08
  Filled 2020-06-08: qty 10

## 2020-06-08 MED ORDER — SODIUM CHLORIDE 0.9 % IV SOLN
Freq: Once | INTRAVENOUS | Status: AC
  Filled 2020-06-08: qty 250

## 2020-06-08 MED ORDER — HEPARIN SOD (PORK) LOCK FLUSH 100 UNIT/ML IV SOLN
500.0000 [IU] | INTRAVENOUS | Status: DC | PRN
  Filled 2020-06-08: qty 5

## 2020-06-08 NOTE — Patient Instructions (Signed)
Rehydration, Adult Rehydration is the replacement of body fluids, salts, and minerals (electrolytes) that are lost during dehydration. Dehydration is when there is not enough water or other fluids in the body. This happens when you lose more fluids than you take in. Common causes of dehydration include:  Not drinking enough fluids. This can occur when you are ill or doing activities that require a lot of energy, especially in hot weather.  Conditions that cause loss of water or other fluids, such as diarrhea, vomiting, sweating, or urinating a lot.  Other illnesses, such as fever or infection.  Certain medicines, such as those that remove excess fluid from the body (diuretics). Symptoms of mild or moderate dehydration may include thirst, dry lips and mouth, and dizziness. Symptoms of severe dehydration may include increased heart rate, confusion, fainting, and not urinating. For severe dehydration, you may need to get fluids through an IV at the hospital. For mild or moderate dehydration, you can usually rehydrate at home by drinking certain fluids as told by your health care provider. What are the risks? Generally, rehydration is safe. However, taking in too much fluid (overhydration) can be a problem. This is rare. Overhydration can cause an electrolyte imbalance, kidney failure, or a decrease in salt (sodium) levels in the body. Supplies needed You will need an oral rehydration solution (ORS) if your health care provider tells you to use one. This is a drink to treat dehydration. It can be found in pharmacies and retail stores. How to rehydrate Fluids Follow instructions from your health care provider for rehydration. The kind of fluid and the amount you should drink depend on your condition. In general, you should choose drinks that you prefer.  If told by your health care provider, drink an ORS. ? Make an ORS by following instructions on the package. ? Start by drinking small amounts,  about  cup (120 mL) every 5-10 minutes. ? Slowly increase how much you drink until you have taken the amount recommended by your health care provider.  Drink enough clear fluids to keep your urine pale yellow. If you were told to drink an ORS, finish it first, then start slowly drinking other clear fluids. Drink fluids such as: ? Water. This includes sparkling water and flavored water. Drinking only water can lead to having too little sodium in your body (hyponatremia). Follow the advice of your health care provider. ? Water from ice chips you suck on. ? Fruit juice with water you add to it (diluted). ? Sports drinks. ? Hot or cold herbal teas. ? Broth-based soups. ? Milk or milk products. Food Follow instructions from your health care provider about what to eat while you rehydrate. Your health care provider may recommend that you slowly begin eating regular foods in small amounts.  Eat foods that contain a healthy balance of electrolytes, such as bananas, oranges, potatoes, tomatoes, and spinach.  Avoid foods that are greasy or contain a lot of sugar. In some cases, you may get nutrition through a feeding tube that is passed through your nose and into your stomach (nasogastric tube, or NG tube). This may be done if you have uncontrolled vomiting or diarrhea.   Beverages to avoid Certain beverages may make dehydration worse. While you rehydrate, avoid drinking alcohol.   How to tell if you are recovering from dehydration You may be recovering from dehydration if:  You are urinating more often than before you started rehydrating.  Your urine is pale yellow.  Your energy level   improves.  You vomit less frequently.  You have diarrhea less frequently.  Your appetite improves or returns to normal.  You feel less dizzy or less light-headed.  Your skin tone and color start to look more normal. Follow these instructions at home:  Take over-the-counter and prescription medicines only  as told by your health care provider.  Do not take sodium tablets. Doing this can lead to having too much sodium in your body (hypernatremia). Contact a health care provider if:  You continue to have symptoms of mild or moderate dehydration, such as: ? Thirst. ? Dry lips. ? Slightly dry mouth. ? Dizziness. ? Dark urine or less urine than normal. ? Muscle cramps.  You continue to vomit or have diarrhea. Get help right away if you:  Have symptoms of dehydration that get worse.  Have a fever.  Have a severe headache.  Have been vomiting and the following happens: ? Your vomiting gets worse or does not go away. ? Your vomit includes blood or green matter (bile). ? You cannot eat or drink without vomiting.  Have problems with urination or bowel movements, such as: ? Diarrhea that gets worse or does not go away. ? Blood in your stool (feces). This may cause stool to look black and tarry. ? Not urinating, or urinating only a small amount of very dark urine, within 6-8 hours.  Have trouble breathing.  Have symptoms that get worse with treatment. These symptoms may represent a serious problem that is an emergency. Do not wait to see if the symptoms will go away. Get medical help right away. Call your local emergency services (911 in the [place] Do not drive yourself to the hospital. Summary  Rehydration is the replacement of body fluids and minerals (electrolytes) that are lost during dehydration.  Follow instructions from your health care provider for rehydration. The kind of fluid and amount you should drink depend on your condition.  Slowly increase how much you drink until you have taken the amount recommended by your health care provider.  Contact your health care provider if you continue to show signs of mild or moderate dehydration. This information is not intended to replace advice given to you by your health care provider. Make sure you discuss any questions you have with  your health care provider. Document Revised: 03/06/2019 Document Reviewed: 01/14/2019 Elsevier Patient Education  2021 Elsevier Inc.  

## 2020-06-09 ENCOUNTER — Ambulatory Visit: Payer: Medicare HMO

## 2020-06-10 ENCOUNTER — Ambulatory Visit: Payer: Medicare HMO

## 2020-06-10 ENCOUNTER — Inpatient Hospital Stay: Payer: Medicare HMO

## 2020-06-10 ENCOUNTER — Inpatient Hospital Stay: Payer: Medicare HMO | Admitting: Dietician

## 2020-06-10 ENCOUNTER — Other Ambulatory Visit: Payer: Self-pay

## 2020-06-10 NOTE — Progress Notes (Signed)
Nutrition  Scheduled to see patient for nutrition follow-up in infusion for IVF today. Patient did not show for radiation treatment/IVF appointments today.

## 2020-06-11 ENCOUNTER — Ambulatory Visit
Admission: RE | Admit: 2020-06-11 | Discharge: 2020-06-11 | Disposition: A | Discharge status: Home / Self Care | Payer: Medicare HMO | Source: Ambulatory Visit | Attending: Radiation Oncology | Admitting: Radiation Oncology

## 2020-06-11 ENCOUNTER — Other Ambulatory Visit: Payer: Self-pay

## 2020-06-11 DIAGNOSIS — C01 Malignant neoplasm of base of tongue: Secondary | ICD-10-CM | POA: Diagnosis not present

## 2020-06-11 DIAGNOSIS — Z510 Encounter for antineoplastic radiation therapy: Secondary | ICD-10-CM | POA: Diagnosis not present

## 2020-06-11 NOTE — Progress Notes (Signed)
Oncology Nurse Navigator Documentation  Mr. Mccannon has not shown for the last two previous days of radiation. I called him today to check on him and encourage him to come to his radiation treatments as scheduled. I left a voice mail on his home and mobile phone to call me back as soon as he receives my messages.   [name] RN, BSN, OCN Head & Neck Oncology Nurse [place] at [facility] Phone # [phone]  Fax # [phone]

## 2020-06-12 ENCOUNTER — Ambulatory Visit: Payer: Medicare HMO

## 2020-06-12 ENCOUNTER — Other Ambulatory Visit: Payer: Self-pay

## 2020-06-12 ENCOUNTER — Ambulatory Visit
Admission: RE | Admit: 2020-06-12 | Discharge: 2020-06-12 | Disposition: A | Discharge status: Home / Self Care | Payer: Medicare HMO | Source: Ambulatory Visit | Attending: Radiation Oncology | Admitting: Radiation Oncology

## 2020-06-12 ENCOUNTER — Inpatient Hospital Stay: Payer: Medicare HMO

## 2020-06-12 DIAGNOSIS — Z510 Encounter for antineoplastic radiation therapy: Secondary | ICD-10-CM | POA: Diagnosis not present

## 2020-06-12 DIAGNOSIS — E860 Dehydration: Secondary | ICD-10-CM | POA: Diagnosis not present

## 2020-06-12 DIAGNOSIS — C01 Malignant neoplasm of base of tongue: Secondary | ICD-10-CM

## 2020-06-12 MED ORDER — SODIUM CHLORIDE 0.9 % IV SOLN
Freq: Once | INTRAVENOUS | Status: AC
  Filled 2020-06-12: qty 250

## 2020-06-12 NOTE — Progress Notes (Signed)
Ok to run IV over 1.5hrs per Ebony Hail, RN

## 2020-06-12 NOTE — Patient Instructions (Signed)

## 2020-06-15 ENCOUNTER — Ambulatory Visit: Payer: Medicare HMO

## 2020-06-16 ENCOUNTER — Inpatient Hospital Stay: Payer: Medicare HMO

## 2020-06-16 ENCOUNTER — Ambulatory Visit: Payer: Medicare HMO

## 2020-06-17 ENCOUNTER — Ambulatory Visit: Payer: Medicare HMO

## 2020-06-17 ENCOUNTER — Ambulatory Visit
Admission: RE | Admit: 2020-06-17 | Discharge: 2020-06-17 | Disposition: A | Discharge status: Home / Self Care | Payer: Medicare HMO | Source: Ambulatory Visit | Attending: Radiation Oncology | Admitting: Radiation Oncology

## 2020-06-17 ENCOUNTER — Other Ambulatory Visit: Payer: Self-pay

## 2020-06-17 ENCOUNTER — Inpatient Hospital Stay: Payer: Medicare HMO | Admitting: Dietician

## 2020-06-17 ENCOUNTER — Inpatient Hospital Stay: Payer: Medicare HMO | Attending: Hematology and Oncology

## 2020-06-17 VITALS — BP 120/49 | HR 77 | Resp 17

## 2020-06-17 DIAGNOSIS — Z510 Encounter for antineoplastic radiation therapy: Secondary | ICD-10-CM | POA: Insufficient documentation

## 2020-06-17 DIAGNOSIS — Z79899 Other long term (current) drug therapy: Secondary | ICD-10-CM | POA: Diagnosis not present

## 2020-06-17 DIAGNOSIS — C01 Malignant neoplasm of base of tongue: Secondary | ICD-10-CM | POA: Diagnosis not present

## 2020-06-17 MED ORDER — SODIUM CHLORIDE 0.9 % IV SOLN
Freq: Once | INTRAVENOUS | Status: AC
  Filled 2020-06-17: qty 250

## 2020-06-17 NOTE — Progress Notes (Signed)
Nutrition Follow-up:  Met with patient in infusion during IVF. He reports continuing to eat regular foods. Patient reports he has not eaten today, says he is not a breakfast person, did not eat lunch prior to coming for appointment. Yesterday he had the new burger from [employer] with crinkle fries and a Dr. Malachi Bonds. Patient reports he ate most of it and shared a little with his dog Gizmo, says he really likes the crinkle fries. Patient reports he has not been drinking Ensure supplements at home and he is agreeable to drinking vanilla Ensure Complete during follow-up. Patient sipping on supplement through a straw, states this is much easier going down this way. He is unsure if he has straws at home. Patient reports thick saliva is improving, he has not been using mouth rinse.    Medications: reviewed  Labs: reviewed  Anthropometrics: Weight 139.2 lb today decreased from 141 on 5/23 and 143.2 on 5/10   NUTRITION DIAGNOSIS: Unintended weight loss continues   INTERVENTION:  Educated patient again on the importance of adequate intake of calories and protein  Pt encouraged to increase Ensure intake at home, he will try to drink 3/day Pt given vanilla Ensure Max to drink during IVF  Pt appreciated ease of drinking supplement through straw, gave pt handful of straws to take home  Encouraged ordering milkshake with burger at [employer] Recommended using mouth rinse several times/day      MONITORING, EVALUATION, GOAL: weight trends, intake   NEXT VISIT: Wednesday June 8

## 2020-06-17 NOTE — Patient Instructions (Signed)
Dehydration, Adult Dehydration is condition in which there is not enough water or other fluids in the body. This happens when a person loses more fluids than he or she takes in. Important body parts cannot work right without the right amount of fluids. Any loss of fluids from the body can cause dehydration. Dehydration can be mild, worse, or very bad. It should be treated right away to keep it from getting very bad. What are the causes? This condition may be caused by:  Conditions that cause loss of water or other fluids, such as: ? Watery poop (diarrhea). ? Vomiting. ? Sweating a lot. ? Peeing (urinating) a lot.  Not drinking enough fluids, especially when you: ? Are ill. ? Are doing things that take a lot of energy to do.  Other illnesses and conditions, such as fever or infection.  Certain medicines, such as medicines that take extra fluid out of the body (diuretics).  Lack of safe drinking water.  Not being able to get enough water and food. What increases the risk? The following factors may make you more likely to develop this condition:  Having a long-term (chronic) illness that has not been treated the right way, such as: ? Diabetes. ? Heart disease. ? Kidney disease.  Being 65 years of age or older.  Having a disability.  Living in a place that is high above the ground or sea (high in altitude). The thinner, dried air causes more fluid loss.  Doing exercises that put stress on your body for a long time. What are the signs or symptoms? Symptoms of dehydration depend on how bad it is. Mild or worse dehydration  Thirst.  Dry lips or dry mouth.  Feeling dizzy or light-headed, especially when you stand up from sitting.  Muscle cramps.  Your body making: ? Dark pee (urine). Pee may be the color of tea. ? Less pee than normal. ? Less tears than normal.  Headache. Very bad dehydration  Changes in skin. Skin may: ? Be cold to the touch (clammy). ? Be blotchy  or pale. ? Not go back to normal right after you lightly pinch it and let it go.  Little or no tears, pee, or sweat.  Changes in vital signs, such as: ? Fast breathing. ? Low blood pressure. ? Weak pulse. ? Pulse that is more than 100 beats a minute when you are sitting still.  Other changes, such as: ? Feeling very thirsty. ? Eyes that look hollow (sunken). ? Cold hands and feet. ? Being mixed up (confused). ? Being very tired (lethargic) or having trouble waking from sleep. ? Short-term weight loss. ? Loss of consciousness. How is this treated? Treatment for this condition depends on how bad it is. Treatment should start right away. Do not wait until your condition gets very bad. Very bad dehydration is an emergency. You will need to go to a hospital.  Mild or worse dehydration can be treated at home. You may be asked to: ? Drink more fluids. ? Drink an oral rehydration solution (ORS). This drink helps get the right amounts of fluids and salts and minerals in the blood (electrolytes).  Very bad dehydration can be treated: ? With fluids through an IV tube. ? By getting normal levels of salts and minerals in your blood. This is often done by giving salts and minerals through a tube. The tube is passed through your nose and into your stomach. ? By treating the root cause. Follow these instructions at   home: Oral rehydration solution If told by your doctor, drink an ORS:  Make an ORS. Use instructions on the package.  Start by drinking small amounts, about  cup (120 mL) every 5-10 minutes.  Slowly drink more until you have had the amount that your doctor said to have. Eating and drinking  Drink enough clear fluid to keep your pee pale yellow. If you were told to drink an ORS, finish the ORS first. Then, start slowly drinking other clear fluids. Drink fluids such as: ? Water. Do not drink only water. Doing that can make the salt (sodium) level in your body get too low. ? Water  from ice chips you suck on. ? Fruit juice that you have added water to (diluted). ? Low-calorie sports drinks.  Eat foods that have the right amounts of salts and minerals, such as: ? Bananas. ? Oranges. ? Potatoes. ? Tomatoes. ? Spinach.  Do not drink alcohol.  Avoid: ? Drinks that have a lot of sugar. These include:  High-calorie sports drinks.  Fruit juice that you did not add water to.  Soda.  Caffeine. ? Foods that are greasy or have a lot of fat or sugar.         General instructions  Take over-the-counter and prescription medicines only as told by your doctor.  Do not take salt tablets. Doing that can make the salt level in your body get too high.  Return to your normal activities as told by your doctor. Ask your doctor what activities are safe for you.  Keep all follow-up visits as told by your doctor. This is important. Contact a doctor if:  You have pain in your belly (abdomen) and the pain: ? Gets worse. ? Stays in one place.  You have a rash.  You have a stiff neck.  You get angry or annoyed (irritable) more easily than normal.  You are more tired or have a harder time waking than normal.  You feel: ? Weak or dizzy. ? Very thirsty. Get help right away if you have:  Any symptoms of very bad dehydration.  Symptoms of vomiting, such as: ? You cannot eat or drink without vomiting. ? Your vomiting gets worse or does not go away. ? Your vomit has blood or green stuff in it.  Symptoms that get worse with treatment.  A fever.  A very bad headache.  Problems with peeing or pooping (having a bowel movement), such as: ? Watery poop that gets worse or does not go away. ? Blood in your poop (stool). This may cause poop to look black and tarry. ? Not peeing in 6-8 hours. ? Peeing only a small amount of very dark pee in 6-8 hours.  Trouble breathing. These symptoms may be an emergency. Do not wait to see if the symptoms will go away. Get  medical help right away. Call your local emergency services (911 in the [place] Do not drive yourself to the hospital. Summary  Dehydration is a condition in which there is not enough water or other fluids in the body. This happens when a person loses more fluids than he or she takes in.  Treatment for this condition depends on how bad it is. Treatment should be started right away. Do not wait until your condition gets very bad.  Drink enough clear fluid to keep your pee pale yellow. If you were told to drink an oral rehydration solution (ORS), finish the ORS first. Then, start slowly drinking other clear fluids.    Take over-the-counter and prescription medicines only as told by your doctor.  Get help right away if you have any symptoms of very bad dehydration. This information is not intended to replace advice given to you by your health care provider. Make sure you discuss any questions you have with your health care provider. Document Revised: 08/16/2018 Document Reviewed: 08/16/2018 Elsevier Patient Education  2021 [employer]  

## 2020-06-18 ENCOUNTER — Ambulatory Visit: Payer: Medicare HMO

## 2020-06-19 ENCOUNTER — Ambulatory Visit: Payer: Medicare HMO

## 2020-06-19 ENCOUNTER — Inpatient Hospital Stay: Payer: Medicare HMO

## 2020-06-22 ENCOUNTER — Telehealth: Payer: Self-pay

## 2020-06-22 ENCOUNTER — Inpatient Hospital Stay: Payer: Medicare HMO

## 2020-06-22 ENCOUNTER — Ambulatory Visit: Payer: Medicare HMO

## 2020-06-22 NOTE — Telephone Encounter (Signed)
Called and left a VM with patient on Friday 06/19/20 to see how he was doing, and if he still planned to come for radiation and IVFs. Patient called back after 16:00 to say he was feeling too tired and poorly, and didn't want to come into clinic. Encouraged patient to drink lots of fluids over the weekend to help prevent dehydration, and emphasized importance of keeping appointments on Monday 06/22/20. Patient verbalized understanding and agreement, and denied any other needs. He has my direct call back number should he have any other questions/concerns

## 2020-06-23 ENCOUNTER — Other Ambulatory Visit: Payer: Self-pay

## 2020-06-23 ENCOUNTER — Ambulatory Visit: Payer: Medicare HMO

## 2020-06-23 ENCOUNTER — Encounter: Payer: Self-pay | Admitting: Radiation Oncology

## 2020-06-23 ENCOUNTER — Ambulatory Visit
Admission: RE | Admit: 2020-06-23 | Discharge: 2020-06-23 | Disposition: A | Discharge status: Home / Self Care | Payer: Medicare HMO | Source: Ambulatory Visit | Attending: Radiation Oncology | Admitting: Radiation Oncology

## 2020-06-23 DIAGNOSIS — C01 Malignant neoplasm of base of tongue: Secondary | ICD-10-CM | POA: Diagnosis not present

## 2020-06-23 DIAGNOSIS — Z510 Encounter for antineoplastic radiation therapy: Secondary | ICD-10-CM | POA: Diagnosis not present

## 2020-06-23 NOTE — Progress Notes (Signed)
Oncology Nurse Navigator Documentation  I met with Jerry Cook before his radiation treatment today. He missed yesterday's treatment and only received one treatment last week due to no show's. I asked him today why he missed his appointments and he told me that he has been oversleeping. I asked if there was anything I could do to help him to make it to his treatments on time and he couldn't answer how I could help him. I informed him that missing treatments decreases the ability of radiation to treat his cancer successfully that radiation needs to be given 5 days a week to ensure proper treatment for his cancer. He voiced his understanding. He also was made aware that he is scheduled for IVF Monday, Wednesday, Friday after his radiation treatments.  [name] RN, BSN, OCN Head & Neck Oncology Nurse [place] at [facility] Phone # [phone]  Fax # [phone]

## 2020-06-24 ENCOUNTER — Ambulatory Visit: Payer: Medicare HMO

## 2020-06-24 ENCOUNTER — Inpatient Hospital Stay: Payer: Medicare HMO

## 2020-06-24 ENCOUNTER — Inpatient Hospital Stay: Payer: Medicare HMO | Admitting: Nutrition

## 2020-06-24 ENCOUNTER — Encounter: Payer: Self-pay | Admitting: Nutrition

## 2020-06-24 NOTE — Progress Notes (Signed)
Patient did not show up for nutrition appointment. 

## 2020-06-25 ENCOUNTER — Ambulatory Visit: Payer: Medicare HMO

## 2020-06-26 ENCOUNTER — Ambulatory Visit: Payer: Medicare HMO

## 2020-06-29 ENCOUNTER — Ambulatory Visit: Payer: Medicare HMO

## 2020-06-29 ENCOUNTER — Inpatient Hospital Stay: Payer: Medicare HMO

## 2020-06-30 ENCOUNTER — Ambulatory Visit: Payer: Medicare HMO

## 2020-07-01 ENCOUNTER — Inpatient Hospital Stay: Payer: Medicare HMO

## 2020-07-01 ENCOUNTER — Ambulatory Visit: Payer: Medicare HMO

## 2020-07-01 ENCOUNTER — Inpatient Hospital Stay: Payer: Medicare HMO | Admitting: Nutrition

## 2020-07-01 ENCOUNTER — Encounter: Payer: Self-pay | Admitting: Nutrition

## 2020-07-01 NOTE — Progress Notes (Signed)
Patient did not show up for nutrition appointment. 

## 2020-07-02 ENCOUNTER — Ambulatory Visit: Payer: Medicare HMO

## 2020-07-02 NOTE — Progress Notes (Signed)
Oncology Nurse Navigator Documentation   Mr. Jerry Cook has not presented for his daily radiation treatments since 06/23/20. I have made multiple attempts to contact him without success. On 6/10 I called and left a voice mail with him in the am telling him his appointment time that day. He returned my call at 7:30 pm after hours to tell me that he planned to present for treatment the following week. On 6/13 I again called him in the morning and left a message with his appointments times for that day. He returned my phone call at 6:30 pm and left a message that he planned to come for his upcoming treatments. On 6/15 I called him again in the morning to inform him of his appointment times for the day. He left me a voice mail at 5:45 pm saying that he would be coming for his appointment Friday (6/17) at 3:00 pm.  I again returned his call today and left him a voice mail informing him that he has daily radiation appointments scheduled and he is scheduled at 3:15 pm today (6/16) and 12:45 tomorrow (6/17). I also informed him via voice mail that if he does not come to treatment this week we will be closing out his radiation treatment plan due to his multiple no-shows to his appointments per Dr. Pearlie Oyster instructions.  Mr. Jerry Cook has my number and has been made aware to call me during office hours so I can speak with him to offer any type of assistance to help him complete his radiation treatments.  [name] RN, BSN, OCN Head & Neck Oncology Nurse [place] at [facility] Phone # [phone]  Fax # [phone]

## 2020-07-03 ENCOUNTER — Encounter: Payer: Self-pay | Admitting: Radiation Oncology

## 2020-07-03 ENCOUNTER — Other Ambulatory Visit: Payer: Self-pay

## 2020-07-03 ENCOUNTER — Ambulatory Visit
Admission: RE | Admit: 2020-07-03 | Discharge: 2020-07-03 | Disposition: A | Discharge status: Home / Self Care | Payer: Medicare HMO | Source: Ambulatory Visit | Attending: Radiation Oncology | Admitting: Radiation Oncology

## 2020-07-03 ENCOUNTER — Inpatient Hospital Stay: Payer: Medicare HMO

## 2020-07-03 VITALS — BP 110/58 | HR 96 | Temp 97.8°F | Resp 17

## 2020-07-03 DIAGNOSIS — C01 Malignant neoplasm of base of tongue: Secondary | ICD-10-CM

## 2020-07-03 DIAGNOSIS — Z79899 Other long term (current) drug therapy: Secondary | ICD-10-CM | POA: Diagnosis not present

## 2020-07-03 DIAGNOSIS — Z510 Encounter for antineoplastic radiation therapy: Secondary | ICD-10-CM | POA: Diagnosis not present

## 2020-07-03 MED ORDER — SODIUM CHLORIDE 0.9 % IV SOLN
Freq: Once | INTRAVENOUS | Status: AC
  Filled 2020-07-03: qty 250

## 2020-07-03 NOTE — Patient Instructions (Signed)

## 2020-07-06 ENCOUNTER — Other Ambulatory Visit: Payer: Medicare HMO

## 2020-07-06 ENCOUNTER — Ambulatory Visit: Payer: Medicare HMO

## 2020-07-07 ENCOUNTER — Ambulatory Visit: Payer: Medicare HMO

## 2020-07-07 ENCOUNTER — Telehealth: Payer: Self-pay

## 2020-07-07 NOTE — Telephone Encounter (Signed)
Called patient this morning and left a VM to let him know that since he did not show for his appointments yesterday, the remainder of his radiation treatments will be canceled and he will just follow-up with Dr. [name] as scheduled on 07/22/20. Informed him that he still has IVF appointments available, but if he didn't foresee that he would be in for those appointments, I would cancel them so another patient would be able to use his time slot. Provided direct call back number and asked that patient call me sometime today to let me know he received my VM, and let me know his decision about IVF appointments.

## 2020-07-08 ENCOUNTER — Ambulatory Visit: Payer: Medicare HMO

## 2020-07-08 ENCOUNTER — Encounter: Payer: Medicare HMO | Admitting: Nutrition

## 2020-07-09 ENCOUNTER — Ambulatory Visit: Payer: Medicare HMO

## 2020-07-10 ENCOUNTER — Ambulatory Visit: Payer: Medicare HMO

## 2020-07-13 ENCOUNTER — Ambulatory Visit: Payer: Medicare HMO

## 2020-07-13 ENCOUNTER — Other Ambulatory Visit: Payer: Medicare HMO

## 2020-07-15 ENCOUNTER — Ambulatory Visit: Payer: Medicare HMO

## 2020-07-15 ENCOUNTER — Encounter: Payer: Medicare HMO | Admitting: Nutrition

## 2020-07-17 ENCOUNTER — Ambulatory Visit: Payer: Medicare HMO

## 2020-07-21 NOTE — Progress Notes (Signed)
Oncology Nurse Navigator Documentation   I called and left a voice mail with Mr. V[place]aw reminding him of his follow up appointment with Dr. [name] tomorrow at 3:50. I left my direct contact information and asked that he call me to verify he got my message and that he would be coming tomorrow.  [name] RN, BSN, OCN Head & Neck Oncology Nurse [place] at [facility] Phone # [phone]  Fax # [phone]

## 2020-07-22 ENCOUNTER — Other Ambulatory Visit: Payer: Self-pay

## 2020-07-22 ENCOUNTER — Ambulatory Visit
Admission: RE | Admit: 2020-07-22 | Discharge: 2020-07-22 | Disposition: A | Discharge status: Home / Self Care | Payer: Medicare HMO | Source: Ambulatory Visit | Attending: Radiation Oncology | Admitting: Radiation Oncology

## 2020-07-22 VITALS — BP 91/58 | HR 60 | Temp 97.8°F | Resp 20 | Ht 70.0 in | Wt 133.8 lb

## 2020-07-22 DIAGNOSIS — Z923 Personal history of irradiation: Secondary | ICD-10-CM | POA: Insufficient documentation

## 2020-07-22 DIAGNOSIS — C01 Malignant neoplasm of base of tongue: Secondary | ICD-10-CM | POA: Insufficient documentation

## 2020-07-22 DIAGNOSIS — Z79899 Other long term (current) drug therapy: Secondary | ICD-10-CM | POA: Insufficient documentation

## 2020-07-22 NOTE — Progress Notes (Signed)
Jerry Cook presents today for follow-up after stopping radiation to his oropharynx/base of tongue early on 07/03/2020  Pain issues, if any: Patient denies Using a feeding tube?: N/A Weight changes, if any:  Wt Readings from Last 3 Encounters:  07/22/20 133 lb 12.8 oz (60.7 kg)  04/20/20 160 lb 4 oz (72.7 kg)  04/14/20 152 lb (68.9 kg)   Swallowing issues, if any: Patient denies. Reports he has resumed eating solid foods again, and can tolerate a wide variety of food/beverages Smoking or chewing tobacco? None Using fluoride trays daily? N/A--hoping to see his community dentist soon for a cleaning Last ENT visit was on: Not since diagnosis Other notable issues, if any: Reports he still feels fatigued and struggles to wake up before the afternoon most days. Continues to deal dry mouth and reports thick saliva only when he first wakes up in the morning. Denies any ear or jaw pain, or difficulty opening his mouth. Skin in treatment field appears dry but intact (continues to apply sonafine daily).    Vitals:   07/22/20 1614 07/22/20 1618  BP:  (!) 91/58  Pulse:  60  Resp: 20   Temp: 97.8 F (36.6 C)   SpO2: 97%

## 2020-07-23 ENCOUNTER — Other Ambulatory Visit: Payer: Self-pay

## 2020-07-23 DIAGNOSIS — C01 Malignant neoplasm of base of tongue: Secondary | ICD-10-CM

## 2020-07-25 ENCOUNTER — Encounter: Payer: Self-pay | Admitting: Radiation Oncology

## 2020-07-25 NOTE — Progress Notes (Signed)
Radiation Oncology         [phone]) [phone] ________________________________  Name: FRANCESCO PROVENCAL MRN: [id number]  Date: 07/22/2020  DOB: 03/30/1950  Follow-Up Visit Note  CC: Shirline Frees, MD  Melida Quitter, MD  Diagnosis and Prior Radiotherapy:       ICD-10-CM   1. Squamous cell carcinoma of base of tongue (HCC)  C01     Cancer Staging Squamous cell carcinoma of base of tongue (HCC) Staging form: Pharynx - HPV-Mediated Oropharynx, AJCC 8th Edition - Clinical stage from 03/25/2020: Stage II (cT3, cN2, cM0, p16+) - Signed by Benay Golomb, MD on 03/25/2020 Stage prefix: Initial diagnosis   CHIEF COMPLAINT:  Here for follow-up and surveillance of throat cancer  Narrative:  The patient returns today for routine follow-up.  Mr. Jerry Cook presents today for follow-up after stopping radiation to his oropharynx/base of tongue early on 07/03/2020; he received an incomplete course due to suboptimal, inconsistent attendance despite social & transportation support from [facility]. He states feeling better now.  Pain issues, if any: Patient denies Using a feeding tube?: N/A Weight changes, if any:  Wt Readings from Last 3 Encounters:  07/22/20 133 lb 12.8 oz (60.7 kg)  04/20/20 160 lb 4 oz (72.7 kg)  04/14/20 152 lb (68.9 kg)   Swallowing issues, if any: Patient denies. Reports he has resumed eating solid foods again, and can tolerate a wide variety of food/beverages Smoking or chewing tobacco? None Using fluoride trays daily? N/A--hoping to see his community dentist soon for a cleaning Last ENT visit was on: Not since diagnosis Other notable issues, if any: Reports he still feels fatigued and struggles to wake up before the afternoon most days. Continues to deal dry mouth and reports thick saliva only when he first wakes up in the morning. Denies any ear or jaw pain, or difficulty opening his mouth. Skin in treatment field appears dry but intact (continues to apply sonafine daily).    Vitals:    07/22/20 1614 07/22/20 1618  BP:  (!) 91/58  Pulse:  60  Resp: 20   Temp: 97.8 F (36.6 C)   SpO2: 97%                        ALLERGIES:  has No Known Allergies.  Meds: Current Outpatient Medications  Medication Sig Dispense Refill   atorvastatin (LIPITOR) 10 MG tablet Take 10 mg by mouth at bedtime.     bifidobacterium infantis (ALIGN) capsule Take 1 capsule by mouth daily.     cetirizine (ZYRTEC) 10 MG tablet Take 10 mg by mouth daily.     clonazePAM (KLONOPIN) 2 MG tablet Take 2 mg by mouth at bedtime.     finasteride (PROSCAR) 5 MG tablet Take 5 mg by mouth daily.     fluticasone (FLONASE) 50 MCG/ACT nasal spray Place 2 sprays into both nostrils as needed for allergies. (Patient not taking: Reported on 03/25/2020)     HYDROcodone-acetaminophen (HYCET) 7.5-325 mg/15 ml solution Take 10-15 mLs by mouth every 4 (four) hours as needed for moderate pain. Take with food. Do not take before driving. 473 mL 0   lidocaine (XYLOCAINE) 2 % solution Patient: Mix 1part 2% viscous lidocaine, 1part H20. Swish & swallow 12mL of diluted mixture, 61min before meals and at bedtime, up to QID 200 mL 3   metoprolol tartrate (LOPRESSOR) 50 MG tablet Take 50 mg by mouth 2 (two) times daily.     omeprazole (PRILOSEC) 20 MG capsule Take  20 mg by mouth daily.     PARoxetine (PAXIL) 20 MG tablet Take 20 mg by mouth daily.     sildenafil (REVATIO) 20 MG tablet Take 100 mg by mouth as needed (erectile dysfunction). (Patient not taking: Reported on 03/25/2020)     tamsulosin (FLOMAX) 0.4 MG CAPS capsule Take 0.4 mg by mouth at bedtime.     traZODone (DESYREL) 100 MG tablet Take 100 mg by mouth at bedtime.     triamterene-hydrochlorothiazide (DYAZIDE) 37.5-25 MG capsule Take 1 capsule by mouth daily.     No current facility-administered medications for this encounter.    Physical Findings: The patient is in no acute distress. Patient is alert and oriented. Wt Readings from Last 3 Encounters:  07/22/20 133 lb  12.8 oz (60.7 kg)  04/20/20 160 lb 4 oz (72.7 kg)  04/14/20 152 lb (68.9 kg)    height is 5\' 10"  (1.778 m) and weight is 133 lb 12.8 oz (60.7 kg). His temperature is 97.8 F (36.6 C). His blood pressure is 91/58 (abnormal) and his pulse is 60. His respiration is 20 and oxygen saturation is 97%. .  General: Alert and oriented, in no acute distress HEENT: Head is normocephalic. Extraocular movements are intact. Oropharynx is notable for no thrush or visible tumor Neck: Neck is notable for persistent mass at right jaw, smaller than at diagnosis Skin: Skin in treatment fields shows satisfactory healing with some residual dryness    Lab Findings: Lab Results  Component Value Date   WBC 6.3 06/08/2020   HGB 11.9 (L) 06/08/2020   HCT 35.4 (L) 06/08/2020   MCV 85.9 06/08/2020   PLT 184 06/08/2020    Lab Results  Component Value Date   TSH 1.270 04/30/2020    Radiographic Findings: No results found.  Impression/Plan:    1) Head and Neck Cancer Status: healing from RT  2) Nutritional Status:  Wt Readings from Last 3 Encounters:  07/22/20 133 lb 12.8 oz (60.7 kg)  04/20/20 160 lb 4 oz (72.7 kg)  04/14/20 152 lb (68.9 kg)   Stabilizing, PO intake much improved per patient. PEG tube: declined by patient  3) Risk Factors: The patient has been educated about risk factors including alcohol and tobacco abuse; they understand that avoidance of alcohol and tobacco is important to prevent recurrences as well as other cancers  4) Swallowing: continue SLP execises  5) Dental: Encouraged to continue regular followup with dentistry, and dental hygiene including fluoride rinses. Wait 6 wks for cleanings so mouth can heal.  6) Thyroid function: check annually Lab Results  Component Value Date   TSH 1.270 04/30/2020    7) Other: patient is at risk for persistent disease due to big breaks in treatment - he understands this and knows salvage options may be considered based on PET results.  Will rescan w PET in mid Sept and see him back then  On date of service, in total, I spent 20 minutes on this encounter. Patient was seen in person. _____________________________________   Eppie Gibson, MD

## 2020-09-28 ENCOUNTER — Telehealth: Payer: Self-pay | Admitting: *Deleted

## 2020-09-28 NOTE — Telephone Encounter (Signed)
CALLED PATIENT TO INFORM OF PET SCAN FOR 10-12-20- ARRIVAL TIME- 2:30 PM PATIENT TO BE NPO-6 HRS. PRIOR TO TEST, PATIENT TO RECEIVE RESULTS FROM DR. SQUIRE ON 10-14-20 @ 11:40 AM, LVM FOR A RETURN CALL

## 2020-09-29 ENCOUNTER — Ambulatory Visit: Payer: Self-pay | Admitting: Radiation Oncology

## 2020-10-12 ENCOUNTER — Encounter (HOSPITAL_COMMUNITY): Payer: Self-pay

## 2020-10-12 ENCOUNTER — Ambulatory Visit (HOSPITAL_COMMUNITY): Admission: RE | Admit: 2020-10-12 | Payer: Medicare HMO | Source: Ambulatory Visit

## 2020-10-13 ENCOUNTER — Telehealth: Payer: Self-pay | Admitting: *Deleted

## 2020-10-13 NOTE — Progress Notes (Signed)
Oncology Nurse Navigator Documentation   Jerry Cook missed his restaging PET scan yesterday and will also need to be rescheduled for follow up with Dr. [name] to receive his results. I called today and left a voice mail with Mr. Stambaugh encouraging him to call Enid Derry (radiation oncology scheduler) to be rescheduled for the PET and follow up with Dr. [name]. I left Shirley's contact information and my own for him to return our phone calls.  [name] RN, BSN, OCN Head & Neck Oncology Nurse [place] at [facility] Phone # [phone]  Fax # [phone]

## 2020-10-13 NOTE — Telephone Encounter (Signed)
CALLED PATIENT TO ASK ABOUT RESCHEDULING MISSED SCAN, LVM FOR A RETURN CALL 

## 2020-10-14 ENCOUNTER — Telehealth: Payer: Self-pay | Admitting: *Deleted

## 2020-10-14 ENCOUNTER — Ambulatory Visit: Payer: Medicare HMO | Admitting: Radiation Oncology

## 2020-10-14 NOTE — Telephone Encounter (Signed)
RETURNED PATIENT'S PHONE CALL, LVM FOR A RETURN CALL 

## 2020-10-21 ENCOUNTER — Telehealth: Payer: Self-pay | Admitting: *Deleted

## 2020-10-21 NOTE — Telephone Encounter (Signed)
CALLED PATIENT TO INFORM OF PET SCAN ON 11-04-20- ARRIVAL TIME- 12:30 PM @ [facility], PATIENT TO HAVE WATER ONLY - 6 HRS. PRIOR TO TEST, PATIENT TO RECEIVE RESULTS FROM DR. SQUIRE ON 11-06-20 2 10:40 AM, LVM FOR AS RETURN CALL

## 2020-11-04 ENCOUNTER — Other Ambulatory Visit: Payer: Self-pay

## 2020-11-04 ENCOUNTER — Ambulatory Visit (HOSPITAL_COMMUNITY)
Admission: RE | Admit: 2020-11-04 | Discharge: 2020-11-04 | Disposition: A | Discharge status: Home / Self Care | Payer: Medicare HMO | Source: Ambulatory Visit | Attending: Radiation Oncology | Admitting: Radiation Oncology

## 2020-11-04 DIAGNOSIS — J320 Chronic maxillary sinusitis: Secondary | ICD-10-CM | POA: Diagnosis not present

## 2020-11-04 DIAGNOSIS — C787 Secondary malignant neoplasm of liver and intrahepatic bile duct: Secondary | ICD-10-CM | POA: Insufficient documentation

## 2020-11-04 DIAGNOSIS — C760 Malignant neoplasm of head, face and neck: Secondary | ICD-10-CM | POA: Diagnosis not present

## 2020-11-04 DIAGNOSIS — I700 Atherosclerosis of aorta: Secondary | ICD-10-CM | POA: Diagnosis not present

## 2020-11-04 DIAGNOSIS — R590 Localized enlarged lymph nodes: Secondary | ICD-10-CM | POA: Insufficient documentation

## 2020-11-04 DIAGNOSIS — I2510 Atherosclerotic heart disease of native coronary artery without angina pectoris: Secondary | ICD-10-CM | POA: Diagnosis not present

## 2020-11-04 DIAGNOSIS — C01 Malignant neoplasm of base of tongue: Secondary | ICD-10-CM | POA: Diagnosis present

## 2020-11-04 DIAGNOSIS — K8020 Calculus of gallbladder without cholecystitis without obstruction: Secondary | ICD-10-CM | POA: Insufficient documentation

## 2020-11-04 LAB — GLUCOSE, CAPILLARY: Glucose-Capillary: 93 mg/dL (ref 70–99)

## 2020-11-04 MED ORDER — FLUDEOXYGLUCOSE F - 18 (FDG) INJECTION
6.8000 | Freq: Once | INTRAVENOUS | Status: AC | PRN
  Administered 2020-11-04: 6.8 via INTRAVENOUS

## 2020-11-06 ENCOUNTER — Ambulatory Visit
Admission: RE | Admit: 2020-11-06 | Discharge: 2020-11-06 | Disposition: A | Discharge status: Home / Self Care | Payer: Medicare HMO | Source: Ambulatory Visit | Attending: Radiation Oncology | Admitting: Radiation Oncology

## 2020-11-13 ENCOUNTER — Other Ambulatory Visit: Payer: Self-pay

## 2020-11-13 ENCOUNTER — Encounter: Payer: Self-pay | Admitting: Radiation Oncology

## 2020-11-13 ENCOUNTER — Ambulatory Visit
Admission: RE | Admit: 2020-11-13 | Discharge: 2020-11-13 | Disposition: A | Discharge status: Home / Self Care | Payer: Medicare HMO | Source: Ambulatory Visit | Attending: Radiation Oncology | Admitting: Radiation Oncology

## 2020-11-13 VITALS — Temp 97.9°F | Resp 20 | Ht 70.0 in | Wt 121.0 lb

## 2020-11-13 DIAGNOSIS — I700 Atherosclerosis of aorta: Secondary | ICD-10-CM | POA: Diagnosis not present

## 2020-11-13 DIAGNOSIS — R634 Abnormal weight loss: Secondary | ICD-10-CM | POA: Diagnosis not present

## 2020-11-13 DIAGNOSIS — Z08 Encounter for follow-up examination after completed treatment for malignant neoplasm: Secondary | ICD-10-CM | POA: Diagnosis not present

## 2020-11-13 DIAGNOSIS — I2510 Atherosclerotic heart disease of native coronary artery without angina pectoris: Secondary | ICD-10-CM | POA: Insufficient documentation

## 2020-11-13 DIAGNOSIS — C01 Malignant neoplasm of base of tongue: Secondary | ICD-10-CM | POA: Diagnosis not present

## 2020-11-13 DIAGNOSIS — C787 Secondary malignant neoplasm of liver and intrahepatic bile duct: Secondary | ICD-10-CM | POA: Insufficient documentation

## 2020-11-13 DIAGNOSIS — J320 Chronic maxillary sinusitis: Secondary | ICD-10-CM | POA: Insufficient documentation

## 2020-11-13 DIAGNOSIS — Z79899 Other long term (current) drug therapy: Secondary | ICD-10-CM | POA: Diagnosis not present

## 2020-11-13 NOTE — Progress Notes (Signed)
Jerry Cook presents today for follow-up after stopping radiation to his oropharynx/base of tongue early on 07/03/2020, and to review PET scan results from 11/04/2020   Pain issues, if any: Denies mouth or throat pain, or generalized body pain. Using a feeding tube?: N/A Weight changes, if any: States he eats when he's hungry, but admits he continues to have a depressed appetite Wt Readings from Last 3 Encounters:  11/13/20 121 lb (54.9 kg)  07/22/20 133 lb 12.8 oz (60.7 kg)  04/20/20 160 lb 4 oz (72.7 kg)   Swallowing issues, if any: Denies any issues with liquids or solid food (just reports a decreased appetite) Smoking or chewing tobacco? None Using fluoride trays daily? N/A Last ENT visit was on: Not since diagnosis Other notable issues, if any: Sustained a fall this morning when trying to open his bedroom curtains (admits he has fallen multiple times in the last 4-6 months; either when standing or ambulating; does report feeling light headed occasionally when he changes position). Denies loss of consciousness, but does appear to have a contusion to his left forehead/temple. Denies any ear or jaw pain. Denies any noticeable improvement in dry mouth or thick saliva. Reports fatigue has improved and he is able to wake-up more easily in the mornings. Denies any swelling under chin or down neck, or difficulty with range of motion to his neck.

## 2020-11-13 NOTE — Progress Notes (Signed)
Radiation Oncology         [phone] ________________________________  Name: Jerry Cook MRN: [id number]  Date: 11/13/2020  DOB: 01/29/1950  Follow-Up Visit Note  Outpatient  CC: Shirline Frees, MD  Melida Quitter, MD  Diagnosis and Prior Radiotherapy:    ICD-10-CM   1. Squamous cell carcinoma of base of tongue (HCC)  C01      Cancer Staging Squamous cell carcinoma of base of tongue (HCC) Staging form: Pharynx - HPV-Mediated Oropharynx, AJCC 8th Edition - Clinical stage from 03/25/2020: Stage II (cT3, cN2, cM0, p16+) - Signed by Benay Dalton, MD on 03/25/2020 Stage prefix: Initial diagnosis  Now with concern for stage IV metastatic disease, in liver  CHIEF COMPLAINT: Here for follow-up and surveillance of throat cancer  Narrative:  The patient returns today for routine follow-up.  I have personally reviewed his PET scan.  He missed his follow-up appointment last week.  This morning he states that he fell and hit his head but did not lose consciousness and has no neurologic symptoms.  He does have a little swelling over his forehead and a small abrasion.  He denies any new symptoms.  He reports that he is eating well but he has lost some weight.  Unfortunately the PET scan shows concern for metastatic disease to the liver and a possible celiac node  He reports that he has fallen more than once over the past few months.  He denies following up with his PCP or his cardiologist and has no upcoming appointments, he states  Wt Readings from Last 3 Encounters:  11/13/20 121 lb (54.9 kg)  07/22/20 133 lb 12.8 oz (60.7 kg)  04/20/20 160 lb 4 oz (72.7 kg)                                 ALLERGIES:  has No Known Allergies.  Meds: Current Outpatient Medications  Medication Sig Dispense Refill   atorvastatin (LIPITOR) 10 MG tablet Take 10 mg by mouth at bedtime.     bifidobacterium infantis (ALIGN) capsule Take 1 capsule by mouth daily.     cetirizine (ZYRTEC) 10 MG tablet Take  10 mg by mouth daily.     clonazePAM (KLONOPIN) 2 MG tablet Take 2 mg by mouth at bedtime.     finasteride (PROSCAR) 5 MG tablet Take 5 mg by mouth daily.     fluticasone (FLONASE) 50 MCG/ACT nasal spray Place 2 sprays into both nostrils as needed for allergies. (Patient not taking: Reported on 03/25/2020)     HYDROcodone-acetaminophen (HYCET) 7.5-325 mg/15 ml solution Take 10-15 mLs by mouth every 4 (four) hours as needed for moderate pain. Take with food. Do not take before driving. 473 mL 0   lidocaine (XYLOCAINE) 2 % solution Patient: Mix 1part 2% viscous lidocaine, 1part H20. Swish & swallow 26mL of diluted mixture, 63min before meals and at bedtime, up to QID 200 mL 3   metoprolol tartrate (LOPRESSOR) 50 MG tablet Take 50 mg by mouth 2 (two) times daily.     omeprazole (PRILOSEC) 20 MG capsule Take 20 mg by mouth daily.     PARoxetine (PAXIL) 20 MG tablet Take 20 mg by mouth daily.     sildenafil (REVATIO) 20 MG tablet Take 100 mg by mouth as needed (erectile dysfunction). (Patient not taking: Reported on 03/25/2020)     tamsulosin (FLOMAX) 0.4 MG CAPS capsule Take 0.4 mg by mouth at bedtime.  traZODone (DESYREL) 100 MG tablet Take 100 mg by mouth at bedtime.     triamterene-hydrochlorothiazide (DYAZIDE) 37.5-25 MG capsule Take 1 capsule by mouth daily.     No current facility-administered medications for this encounter.    Physical Findings: The patient is in no acute distress. Patient is alert and oriented.  height is 5\' 10"  (1.778 m) and weight is 121 lb (54.9 kg). His temperature is 97.9 F (36.6 C). His respiration is 20 and oxygen saturation is 94%. .    Vitals:   11/13/20 1146  Resp: 20  Temp: 97.9 F (36.6 C)  SpO2: 94%   General: Alert and oriented, in no acute distress, blunted affect HEENT: Small abrasion and soft tissue swelling over left forehead, no visible tumor in mouth or throat Neck: Neck is supple, no palpable cervical or supraclavicular lymphadenopathy. Heart:  Regular in rate and rhythm with no murmurs, rubs, or gallops. Chest: Clear to auscultation bilaterally, with no rhonchi, wheezes, or rales. Abdomen: Soft, nontender, nondistended, with no rigidity or guarding. Lymphatics: see Neck Exam MSK: Ambulatory  Lab Findings: Lab Results  Component Value Date   WBC 6.3 06/08/2020   HGB 11.9 (L) 06/08/2020   HCT 35.4 (L) 06/08/2020   MCV 85.9 06/08/2020   PLT 184 06/08/2020   Lab Results  Component Value Date   TSH 1.270 04/30/2020     Radiographic Findings: NM PET Image Restag (PS) Skull Base To Thigh  Result Date: 11/05/2020 CLINICAL DATA:  Subsequent treatment strategy for head neck cancer surveillance in a 70 year old male post radiotherapy. EXAM: NUCLEAR MEDICINE PET SKULL BASE TO THIGH TECHNIQUE: 6.7 mCi F-18 FDG was injected intravenously. Full-ring PET imaging was performed from the skull base to thigh after the radiotracer. CT data was obtained and used for attenuation correction and anatomic localization. Fasting blood glucose: 93 mg/dl COMPARISON:  Comparison made with March of 2022. FINDINGS: Mediastinal blood pool activity: SUV max 2.05 Liver activity: SUV max NA NECK: Marked interval response to therapy since previous imaging. Single lymph node at RIGHT level I 8 mm, previously 22 mm short axis, maximum SUV in this area 4.7 as compared to 10.3. Bulky mass in the RIGHT neck is is no longer visible. Asymmetry in the glossotonsillar region is also not visible. Heterogeneous uptake about the RIGHT greater than LEFT neck more likely related to muscular activity. No discernible lymph node outside of the lymph node discussed above is noted in this location. Incidental CT findings: None CHEST: No hypermetabolic mediastinal or hilar nodes. No suspicious pulmonary nodules on the CT scan. Incidental CT findings: Calcified atheromatous plaque in the thoracic aorta. Calcified coronary artery disease. Normal heart size without substantial pericardial  effusion. Normal caliber of the central pulmonary vessels. Limited assessment of cardiovascular structures given lack of intravenous contrast. Lungs are clear.  Airways are patent. ABDOMEN/PELVIS: Metastatic lesion in the LEFT hemi liver (image 109/4) 15 mm with a maximum SUV of 8.8. (Image 111/4) focus of increased metabolic activity may represent a small celiac lymph node with a maximum SUV of 5.7. No discernible discrete area noted on this CT. Focal activity in the RIGHT hemicolon with question of some colonic thickening though under distension limiting assessment in this area maximum SUV of 6.4 in the area of the ileocecal valve. No adenopathy elsewhere in the abdomen or in the pelvis. Increased FDG uptake in the region of the anal canal more likely physiologic. Incidental CT findings: No acute findings relative to liver, gallbladder, pancreas, spleen and adrenal  glands are grossly normal. Renal cysts on the RIGHT. No hydronephrosis. Urinary bladder is collapsed. No acute gastrointestinal process. Activity in the RIGHT colon more pronounced about the ileocecal region with some question of thickening, scattered areas of activity in the RIGHT colon elsewhere not as pronounced. SKELETON: No focal hypermetabolic activity to suggest skeletal metastasis. Incidental CT findings: Incidental note is made of prior trauma to the maxilla. Underlying maxillary sinus disease on the RIGHT is worsened in the interval. IMPRESSION: Marked interval response to therapy still with some increased metabolic activity associated with the mildly enlarged RIGHT neck lymph node. Muscular activity elsewhere in the neck and sequela of post treatment change. Unfortunately, there is evidence of hepatic metastatic disease and potential celiac nodal disease on the current study. Findings of more focal uptake in the RIGHT colon. This finding is nonspecific but would consider correlation with recent colonoscopy if performed or with follow-up  colonoscopy if not recently performed as warranted. Question of thickening though the colon is decompressed in this area. Worsening of RIGHT maxillary sinus disease since previous imaging. Cholelithiasis. Aortic Atherosclerosis (ICD10-I70.0). Electronically Signed   By: Zetta Bills M.D.   On: 11/05/2020 16:19    Impression/Plan: I shared the patient's PET results with him.  He has had a near complete metabolic response to radiation.  Unfortunately he has new lesions in his liver and a celiac node that is suspicious.  This is suspicious for metastatic disease but cannot rule out a second primary.  We will refer him to medical oncology for management.  I will see him on an as-needed basis.  He understands that they may want to obtain a biopsy before recommending plan of care.  All questions were answered to his satisfaction and Anderson Malta, our navigator, was present for the encounter.  Despite recommendations at present and in the past he does not seem motivated to schedule appointments with cardiology or his primary doctor.  For now, we will focus on getting him to medical oncology for management.  On date of service, in total, I spent 30 minutes on this encounter. Patient was seen in person.  _____________________________________   Eppie Gibson, MD

## 2020-11-13 NOTE — Progress Notes (Signed)
Oncology Nurse Navigator Documentation   I met with Jerry Cook during his follow up appointment with Dr. [name]. He reports that he is feeling and eating well. Dr. [name] provided him with his restaging PET results and explained that she will refer him to Medical Oncology for consult based on his results. Mr. Eckenrode voiced his understanding. I accompanied him upstairs and explained that he would be getting a call for an appointment and I verbalized that I would call him as well to make sure he was aware of the appointment. He voiced his understanding.  [name] RN, BSN, OCN Head & Neck Oncology Nurse [place] at [facility] Phone # [phone]  Fax # [phone]

## 2020-11-17 ENCOUNTER — Telehealth: Payer: Self-pay | Admitting: Hematology and Oncology

## 2020-11-17 NOTE — Telephone Encounter (Signed)
Scheduled appt per 10/28 referral. Pt is an established pt of Dr. Rob Hickman. Scheduled pt with Dr. Alvy Bimler while Dr. Chryl Heck is out. Called pt, no answer. Left msg with appt date and time.

## 2020-11-18 ENCOUNTER — Encounter: Payer: Self-pay | Admitting: Radiation Oncology

## 2020-11-18 NOTE — Progress Notes (Signed)
  Radiation Oncology         [phone] ________________________________  Name: Jerry Cook MRN: [id number]  Date: 07/03/2020  DOB: [birth year]  End of Treatment Note  Diagnosis:   Cancer Staging Squamous cell carcinoma of base of tongue ([place]) Staging form: Pharynx - HPV-Mediated Oropharynx, AJCC 8th Edition - Clinical stage from 03/25/2020: Stage II (cT3, cN2, cM0, p16+) - Signed by Benay Goettl, MD on 03/25/2020 Stage prefix: Initial diagnosis      Indication for treatment:  curative       Radiation treatment dates:   04/27/2020 to 07/03/2020  Site/dose:   The patient received intensity modulated radiation therapy to the oropharynx and bilateral neck.  Beams/energy:   IMRT, 6 MV photons  Narrative: The patient tolerated radiation treatment relatively well.   However, he missed multiple treatments despite intensive navigation by our care team and ultimately elected to stop radiation early [place].  He received 60 Gray in 30 fractions rather than the prescribed total dose of 70 Gray in 35 fractions.  Plan: The patient has completed radiation treatment. The patient will return to radiation oncology clinic for routine followup in one half month. I advised them to call or return sooner if they have any questions or concerns related to their recovery or treatment.  -----------------------------------  Eppie Gibson, MD

## 2020-11-19 ENCOUNTER — Telehealth: Payer: Self-pay

## 2020-11-19 NOTE — Telephone Encounter (Signed)
Called and left a message for Dr. Osborn Coho office requesting office notes and colonoscopy report. Left faxed #.

## 2020-11-27 ENCOUNTER — Inpatient Hospital Stay: Payer: Medicare HMO

## 2020-11-27 ENCOUNTER — Encounter: Payer: Self-pay | Admitting: Hematology and Oncology

## 2020-11-27 ENCOUNTER — Other Ambulatory Visit: Payer: Self-pay

## 2020-11-27 ENCOUNTER — Inpatient Hospital Stay: Payer: Medicare HMO | Attending: Hematology and Oncology | Admitting: Hematology and Oncology

## 2020-11-27 VITALS — BP 101/50 | HR 100 | Temp 98.1°F | Resp 18 | Ht 70.0 in | Wt 121.6 lb

## 2020-11-27 DIAGNOSIS — Z923 Personal history of irradiation: Secondary | ICD-10-CM | POA: Insufficient documentation

## 2020-11-27 DIAGNOSIS — Z79899 Other long term (current) drug therapy: Secondary | ICD-10-CM | POA: Insufficient documentation

## 2020-11-27 DIAGNOSIS — Z87891 Personal history of nicotine dependence: Secondary | ICD-10-CM | POA: Insufficient documentation

## 2020-11-27 DIAGNOSIS — K219 Gastro-esophageal reflux disease without esophagitis: Secondary | ICD-10-CM | POA: Insufficient documentation

## 2020-11-27 DIAGNOSIS — C01 Malignant neoplasm of base of tongue: Secondary | ICD-10-CM | POA: Insufficient documentation

## 2020-11-27 DIAGNOSIS — R490 Dysphonia: Secondary | ICD-10-CM | POA: Insufficient documentation

## 2020-11-27 DIAGNOSIS — J320 Chronic maxillary sinusitis: Secondary | ICD-10-CM | POA: Diagnosis not present

## 2020-11-27 DIAGNOSIS — C787 Secondary malignant neoplasm of liver and intrahepatic bile duct: Secondary | ICD-10-CM | POA: Insufficient documentation

## 2020-11-27 DIAGNOSIS — K8020 Calculus of gallbladder without cholecystitis without obstruction: Secondary | ICD-10-CM | POA: Insufficient documentation

## 2020-11-27 DIAGNOSIS — R933 Abnormal findings on diagnostic imaging of other parts of digestive tract: Secondary | ICD-10-CM | POA: Diagnosis not present

## 2020-11-27 DIAGNOSIS — K639 Disease of intestine, unspecified: Secondary | ICD-10-CM

## 2020-11-27 DIAGNOSIS — I10 Essential (primary) hypertension: Secondary | ICD-10-CM | POA: Diagnosis not present

## 2020-11-27 DIAGNOSIS — N400 Enlarged prostate without lower urinary tract symptoms: Secondary | ICD-10-CM | POA: Diagnosis not present

## 2020-11-27 DIAGNOSIS — R221 Localized swelling, mass and lump, neck: Secondary | ICD-10-CM | POA: Insufficient documentation

## 2020-11-27 DIAGNOSIS — R634 Abnormal weight loss: Secondary | ICD-10-CM | POA: Diagnosis not present

## 2020-11-27 DIAGNOSIS — E785 Hyperlipidemia, unspecified: Secondary | ICD-10-CM | POA: Diagnosis not present

## 2020-11-27 DIAGNOSIS — Z224 Carrier of infections with a predominantly sexual mode of transmission: Secondary | ICD-10-CM | POA: Diagnosis not present

## 2020-11-27 DIAGNOSIS — N281 Cyst of kidney, acquired: Secondary | ICD-10-CM | POA: Diagnosis not present

## 2020-11-27 DIAGNOSIS — I700 Atherosclerosis of aorta: Secondary | ICD-10-CM | POA: Insufficient documentation

## 2020-11-27 LAB — CBC WITH DIFFERENTIAL (CANCER CENTER ONLY)
Abs Immature Granulocytes: 0.02 10*3/uL (ref 0.00–0.07)
Basophils Absolute: 0.1 10*3/uL (ref 0.0–0.1)
Basophils Relative: 1 %
Eosinophils Absolute: 0.1 10*3/uL (ref 0.0–0.5)
Eosinophils Relative: 2 %
HCT: 35.5 % — ABNORMAL LOW (ref 39.0–52.0)
Hemoglobin: 11.9 g/dL — ABNORMAL LOW (ref 13.0–17.0)
Immature Granulocytes: 0 %
Lymphocytes Relative: 7 %
Lymphs Abs: 0.5 10*3/uL — ABNORMAL LOW (ref 0.7–4.0)
MCH: 30.3 pg (ref 26.0–34.0)
MCHC: 33.5 g/dL (ref 30.0–36.0)
MCV: 90.3 fL (ref 80.0–100.0)
Monocytes Absolute: 0.9 10*3/uL (ref 0.1–1.0)
Monocytes Relative: 13 %
Neutro Abs: 5.2 10*3/uL (ref 1.7–7.7)
Neutrophils Relative %: 77 %
Platelet Count: 194 10*3/uL (ref 150–400)
RBC: 3.93 MIL/uL — ABNORMAL LOW (ref 4.22–5.81)
RDW: 13.8 % (ref 11.5–15.5)
WBC Count: 6.9 10*3/uL (ref 4.0–10.5)
nRBC: 0 % (ref 0.0–0.2)

## 2020-11-27 LAB — CMP (CANCER CENTER ONLY)
ALT: 8 U/L (ref 0–44)
AST: 11 U/L — ABNORMAL LOW (ref 15–41)
Albumin: 3.6 g/dL (ref 3.5–5.0)
Alkaline Phosphatase: 59 U/L (ref 38–126)
Anion gap: 9 (ref 5–15)
BUN: 18 mg/dL (ref 8–23)
CO2: 29 mmol/L (ref 22–32)
Calcium: 9.2 mg/dL (ref 8.9–10.3)
Chloride: 101 mmol/L (ref 98–111)
Creatinine: 1.28 mg/dL — ABNORMAL HIGH (ref 0.61–1.24)
GFR, Estimated: 59 mL/min — ABNORMAL LOW (ref >60)
Glucose, Bld: 91 mg/dL (ref 70–99)
Potassium: 4 mmol/L (ref 3.5–5.1)
Sodium: 139 mmol/L (ref 135–145)
Total Bilirubin: 0.7 mg/dL (ref 0.3–1.2)
Total Protein: 6.5 g/dL (ref 6.5–8.1)

## 2020-11-27 LAB — PROTIME-INR
INR: 1.1 (ref 0.8–1.2)
Prothrombin Time: 13.9 seconds (ref 11.4–15.2)

## 2020-11-27 LAB — CEA (IN HOUSE-CHCC): CEA (CHCC-In House): 3.17 ng/mL (ref 0.00–5.00)

## 2020-11-27 LAB — APTT: aPTT: 30 seconds (ref 24–36)

## 2020-11-27 NOTE — Assessment & Plan Note (Signed)
I have reviewed multiple imaging studies with the patient The patient was not offered systemic chemotherapy due to significant weakness when evaluated by his previous oncologist PET CT imaging show significant improvement of disease control with radiation treatment but now has signs of metastatic disease in the abdomen and liver PET/CT imaging also disclosed possible abnormal uptake in his colon I was able to secure documentation from his gastroenterologist office from 2019 He had normal colonoscopy evaluation on March 07, 2017 without abnormalities in his colon except for some diverticulosis Certainly, metastatic cancer of the tongue to the liver is a possibility but cancer from the abdomen is also a possibility The patient is known to have enlarged prostate I recommend we proceed with blood work and tumor marker as well as CT guided biopsy of the liver lesion for further evaluation The patient is aware he will need systemic chemotherapy given the abnormal findings most consistent with stage IV metastatic cancer I will see him back in 3 weeks for further follow-up

## 2020-11-27 NOTE — Progress Notes (Signed)
[place] progress notes  Patient Care Team: Shirline Frees, MD as PCP - General (Family Medicine) Eppie Gibson, MD as Consulting Physician (Radiation Oncology) Malmfelt, Stephani Police, RN as Oncology Nurse Navigator Benay Hur, MD as Consulting Physician (Hematology and Oncology)  CHIEF COMPLAINTS/PURPOSE OF VISIT:  Abnormal pet imaging, known history of squamous cell carcinoma of the tongue  HISTORY OF PRESENTING ILLNESS:  Jerry Cook 70 y.o. male was transferred to my care after his prior physician is away from maternity leave The patient was diagnosed with locally advanced tongue cancer earlier this year When he saw his primary oncologist, the patient has significant deconditioning He was not offered systemic chemotherapy According to documentation by radiation oncologist, the patient have difficulties completing radiation treatment He recently had repeat PET/CT imaging to assess and evaluate response of treatment and now is found to have metastatic cancer in his liver Since completion of treatment, the patient has lost a lot of weight Compared with his documented weight in April, the patient have lost over 25 pounds over the past 6 months due to poor appetite and altered taste He has mild hoarseness since diagnosis He continues to drink occasionally, perhaps 1 beer per week He denies smoking No recent falls at home He denies changes in his bowel habits  I reviewed the patient's records extensive and collaborated the history with the patient. Summary of his history is as follows: Oncology History Overview Note  HPV positive   Squamous cell carcinoma of base of tongue (HCC)  03/03/2020 Imaging   CT neck  2.5 x 1.9 x 3.0 cm right base of tongue and glossotonsillar sulcus mass extending to the region of the right vallecula, highly suspicious for a squamous cell carcinoma primary tumor. ENT consultation and direct visualization recommended.    Extensive bilateral cystic/necrotic nodal metastatic disease at the right level 1, 2, 3 and 4 stations and left level 2 station. Several lymph nodes demonstrate ill-defined margins highly suspicious for extracapsular extension.   Lymphadenopathy significantly narrows the upper internal jugular veins bilaterally.   Paranasal sinus disease as described.   03/13/2020 Pathology Results   SURGICAL PATHOLOGY   FINAL MICROSCOPIC DIAGNOSIS:   A. TONGUE, RIGHT BASE MASS, BIOPSY:  - Squamous cell carcinoma.  - See comment.   COMMENT:  Immunohistochemistry will be performed and reported as an addendum.   ADDENDUM: Immunohistochemistry for p16 is strongly and diffusely positive.    03/25/2020 Initial Diagnosis   Squamous cell carcinoma of base of tongue ([facility])   03/25/2020 Cancer Staging   Staging form: Pharynx - HPV-Mediated Oropharynx, AJCC 8th Edition - Clinical stage from 03/25/2020: Stage II (cT3, cN2, cM0, p16+) - Signed by Benay Flow, MD on 03/25/2020 Stage prefix: Initial diagnosis    04/10/2020 PET scan   1. Hypermetabolic mass of the right tongue base and palatine tonsillar region, with extensive hypermetabolic right neck adenopathy and some hypermetabolic left neck adenopathy as well as a small but hypermetabolic right supraclavicular lymph node as detailed above. 2. Questionable focus of accentuated activity along the dome of the liver, probably incidental. If the patient has abnormal liver enzymes or if otherwise clinically warranted, hepatic protocol MRI with and without contrast could be utilized to assess for an actual underlying lesion in this region. 3. Accentuated anorectal activity. This is most commonly physiologic, but correlate with patient's colon screening history in assessing whether further workup is indicated. 4. Other imaging findings of potential clinical significance: Chronic right maxillary sinusitis. Aortic Atherosclerosis (ICD10-I70.0).  Coronary atherosclerosis with  mild cardiomegaly. Prostatomegaly. Sludge versus small gallstones in the gallbladder.     04/27/2020 - 07/03/2020 Radiation Therapy   Radiation treatment dates:   04/27/2020 to 07/03/2020   Site/dose:   The patient received intensity modulated radiation therapy to the oropharynx and bilateral neck.   Beams/energy:   IMRT, 6 MV photons   Narrative: The patient tolerated radiation treatment relatively well.   However, he missed multiple treatments despite intensive navigation by our care team and ultimately elected to stop radiation early [place].  He received 60 Gray in 30 fractions rather than the prescribed total dose of 70 Gray in 35 fractions   11/04/2020 PET scan   Marked interval response to therapy still with some increased metabolic activity associated with the mildly enlarged RIGHT neck lymph node.    Muscular activity elsewhere in the neck and sequela of post treatment change.   Unfortunately, there is evidence of hepatic metastatic disease and potential celiac nodal disease on the current study.   Findings of more focal uptake in the RIGHT colon. This finding is nonspecific but would consider correlation with recent colonoscopy if performed or with follow-up colonoscopy if not recently performed as warranted. Question of thickening though the colon is decompressed in this area.   Worsening of RIGHT maxillary sinus disease since previous imaging.   Cholelithiasis.   Aortic Atherosclerosis (ICD10-I70.0).     MEDICAL HISTORY:  Past Medical History:  Diagnosis Date   GERD (gastroesophageal reflux disease)    Hyperlipidemia    Hypertension     SURGICAL HISTORY: Past Surgical History:  Procedure Laterality Date   EYE SURGERY Bilateral    cataract   LARYNGOSCOPY AND BRONCHOSCOPY N/A 03/13/2020   Procedure: Direct LARYNGOSCOPY WITH BX  AND ESPHOGOSCOPY;  Surgeon: Melida Quitter, MD;  Location: [facility] OR;  Service: ENT;  Laterality: N/A;   TONSILLECTOMY       SOCIAL HISTORY: Social History   Socioeconomic History   Marital status: Single    Spouse name: Not on file   Number of children: Not on file   Years of education: Not on file   Highest education level: Not on file  Occupational History   Not on file  Tobacco Use   Smoking status: Former    Years: 13.00    Types: Cigarettes    Quit date: 03/24/1979    Years since quitting: 41.7   Smokeless tobacco: Never   Tobacco comments:    Quit in 1981  Vaping Use   Vaping Use: Never used  Substance and Sexual Activity   Alcohol use: Not Currently    Alcohol/week: 1.0 standard drink    Types: 1 Cans of beer per week    Comment: Weekly   Drug use: Never   Sexual activity: Not Currently  Other Topics Concern   Not on file  Social History Narrative   Not on file   Social Determinants of Health   Financial Resource Strain: Not on file  Food Insecurity: No Food Insecurity   Worried About [employer] in the Last Year: Never true   [place] in the Last Year: Never true  Transportation Needs: No Transportation Needs   Lack of Transportation (Medical): No   Lack of Transportation (Non-Medical): No  Physical Activity: Inactive   Days of Exercise per Week: 0 days   Minutes of Exercise per Session: 0 min  Stress: Not on file  Social Connections: Socially Isolated   Frequency  of Communication with Friends and Family: Never   Frequency of Social Gatherings with Friends and Family: Never   Attends Religious Services: Never   Marine scientist or Organizations: No   Attends Music therapist: Never   Marital Status: Never married  Human resources officer Violence: Not on file    FAMILY HISTORY: History reviewed. No pertinent family history.  ALLERGIES:  has No Known Allergies.  MEDICATIONS:  Current Outpatient Medications  Medication Sig Dispense Refill   atorvastatin (LIPITOR) 10 MG tablet Take 10 mg by mouth at bedtime.     bifidobacterium infantis  (ALIGN) capsule Take 1 capsule by mouth daily.     cetirizine (ZYRTEC) 10 MG tablet Take 10 mg by mouth daily.     clonazePAM (KLONOPIN) 2 MG tablet Take 2 mg by mouth at bedtime.     finasteride (PROSCAR) 5 MG tablet Take 5 mg by mouth daily.     fluticasone (FLONASE) 50 MCG/ACT nasal spray Place 2 sprays into both nostrils as needed for allergies. (Patient not taking: Reported on 03/25/2020)     HYDROcodone-acetaminophen (HYCET) 7.5-325 mg/15 ml solution Take 10-15 mLs by mouth every 4 (four) hours as needed for moderate pain. Take with food. Do not take before driving. 473 mL 0   metoprolol tartrate (LOPRESSOR) 50 MG tablet Take 50 mg by mouth 2 (two) times daily.     omeprazole (PRILOSEC) 20 MG capsule Take 20 mg by mouth daily.     PARoxetine (PAXIL) 20 MG tablet Take 20 mg by mouth daily.     sildenafil (REVATIO) 20 MG tablet Take 100 mg by mouth as needed (erectile dysfunction). (Patient not taking: Reported on 03/25/2020)     tamsulosin (FLOMAX) 0.4 MG CAPS capsule Take 0.4 mg by mouth at bedtime.     traZODone (DESYREL) 100 MG tablet Take 100 mg by mouth at bedtime.     No current facility-administered medications for this visit.    REVIEW OF SYSTEMS:   Constitutional: Denies fevers, chills or abnormal night sweats Eyes: Denies blurriness of vision, double vision or watery eyes Ears, nose, mouth, throat, and face: Denies mucositis or sore throat Respiratory: Denies cough, dyspnea or wheezes Cardiovascular: Denies palpitation, chest discomfort or lower extremity swelling Gastrointestinal:  Denies nausea, heartburn or change in bowel habits Skin: Denies abnormal skin rashes Lymphatics: Denies new lymphadenopathy or easy bruising Neurological:Denies numbness, tingling or new weaknesses Behavioral/Psych: Mood is stable, no new changes  All other systems were reviewed with the patient and are negative.  PHYSICAL EXAMINATION: ECOG PERFORMANCE STATUS: 2 - Symptomatic, <50% confined to  bed  Vitals:   11/27/20 1201  BP: (!) 101/50  Pulse: 100  Resp: 18  Temp: 98.1 F (36.7 C)  SpO2: 100%   Filed Weights   11/27/20 1201  Weight: 121 lb 9.6 oz (55.2 kg)    GENERAL:alert, no distress and comfortable.  He looks thin and cachectic SKIN: skin color, texture, turgor are normal, no rashes or significant lesions EYES: normal, conjunctiva are pink and non-injected, sclera clear OROPHARYNX:no exudate, normal lips, buccal mucosa, and tongue  NECK: supple, thyroid normal size, non-tender, without nodularity LYMPH:  no palpable lymphadenopathy in the cervical, axillary or inguinal LUNGS: clear to auscultation and percussion with normal breathing effort HEART: regular rate & rhythm and no murmurs without lower extremity edema ABDOMEN:abdomen soft, non-tender and normal bowel sounds Musculoskeletal:no cyanosis of digits and no clubbing  PSYCH: alert & oriented x 3 with fluent speech but with hoarseness  of voice NEURO: no focal motor/sensory deficits  LABORATORY DATA:  I have reviewed the data as listed Lab Results  Component Value Date   WBC 6.9 11/27/2020   HGB 11.9 (L) 11/27/2020   HCT 35.5 (L) 11/27/2020   MCV 90.3 11/27/2020   PLT 194 11/27/2020   Recent Labs    03/03/20 1303 03/04/20 0535 03/25/20 1238 05/19/20 1604 06/02/20 1509 06/08/20 1409 11/27/20 1249  NA 142   < > 139   < > 140 139 139  K 3.5   < > 3.9   < > 3.5 3.2* 4.0  CL 104   < > 98   < > 95* 96* 101  CO2 29   < > 32   < > 27 36* 29  GLUCOSE 110*   < > 102*   < > 74 97 91  BUN 23   < > 19   < > 18 13 18   CREATININE 1.17   < > 1.71*   < > 1.46* 1.38* 1.28*  CALCIUM 8.6*   < > 9.6   < > 9.6 9.2 9.2  GFRNONAA >60   < > 42*   < > 51* 55* 59*  PROT 6.0*  --  7.1  --   --   --  6.5  ALBUMIN 3.3*  --  3.9  --   --   --  3.6  AST 27  --  20  --   --   --  11*  ALT 32  --  18  --   --   --  8  ALKPHOS 59  --  75  --   --   --  59  BILITOT 0.5  --  0.7  --   --   --  0.7   < > = values in this  interval not displayed.    RADIOGRAPHIC STUDIES: I have reviewed imaging studies with the patient I have personally reviewed the radiological images as listed and agreed with the findings in the report. NM PET Image Restag (PS) Skull Base To Thigh  Result Date: 11/05/2020 CLINICAL DATA:  Subsequent treatment strategy for head neck cancer surveillance in a 70 year old male post radiotherapy. EXAM: NUCLEAR MEDICINE PET SKULL BASE TO THIGH TECHNIQUE: 6.7 mCi F-18 FDG was injected intravenously. Full-ring PET imaging was performed from the skull base to thigh after the radiotracer. CT data was obtained and used for attenuation correction and anatomic localization. Fasting blood glucose: 93 mg/dl COMPARISON:  Comparison made with March of 2022. FINDINGS: Mediastinal blood pool activity: SUV max 2.05 Liver activity: SUV max NA NECK: Marked interval response to therapy since previous imaging. Single lymph node at RIGHT level I 8 mm, previously 22 mm short axis, maximum SUV in this area 4.7 as compared to 10.3. Bulky mass in the RIGHT neck is is no longer visible. Asymmetry in the glossotonsillar region is also not visible. Heterogeneous uptake about the RIGHT greater than LEFT neck more likely related to muscular activity. No discernible lymph node outside of the lymph node discussed above is noted in this location. Incidental CT findings: None CHEST: No hypermetabolic mediastinal or hilar nodes. No suspicious pulmonary nodules on the CT scan. Incidental CT findings: Calcified atheromatous plaque in the thoracic aorta. Calcified coronary artery disease. Normal heart size without substantial pericardial effusion. Normal caliber of the central pulmonary vessels. Limited assessment of cardiovascular structures given lack of intravenous contrast. Lungs are clear.  Airways are patent. ABDOMEN/PELVIS: Metastatic lesion  in the LEFT hemi liver (image 109/4) 15 mm with a maximum SUV of 8.8. (Image 111/4) focus of increased  metabolic activity may represent a small celiac lymph node with a maximum SUV of 5.7. No discernible discrete area noted on this CT. Focal activity in the RIGHT hemicolon with question of some colonic thickening though under distension limiting assessment in this area maximum SUV of 6.4 in the area of the ileocecal valve. No adenopathy elsewhere in the abdomen or in the pelvis. Increased FDG uptake in the region of the anal canal more likely physiologic. Incidental CT findings: No acute findings relative to liver, gallbladder, pancreas, spleen and adrenal glands are grossly normal. Renal cysts on the RIGHT. No hydronephrosis. Urinary bladder is collapsed. No acute gastrointestinal process. Activity in the RIGHT colon more pronounced about the ileocecal region with some question of thickening, scattered areas of activity in the RIGHT colon elsewhere not as pronounced. SKELETON: No focal hypermetabolic activity to suggest skeletal metastasis. Incidental CT findings: Incidental note is made of prior trauma to the maxilla. Underlying maxillary sinus disease on the RIGHT is worsened in the interval. IMPRESSION: Marked interval response to therapy still with some increased metabolic activity associated with the mildly enlarged RIGHT neck lymph node. Muscular activity elsewhere in the neck and sequela of post treatment change. Unfortunately, there is evidence of hepatic metastatic disease and potential celiac nodal disease on the current study. Findings of more focal uptake in the RIGHT colon. This finding is nonspecific but would consider correlation with recent colonoscopy if performed or with follow-up colonoscopy if not recently performed as warranted. Question of thickening though the colon is decompressed in this area. Worsening of RIGHT maxillary sinus disease since previous imaging. Cholelithiasis. Aortic Atherosclerosis (ICD10-I70.0). Electronically Signed   By: Zetta Bills M.D.   On: 11/05/2020 16:19     ASSESSMENT & PLAN:  Squamous cell carcinoma of base of tongue (HCC) I have reviewed multiple imaging studies with the patient The patient was not offered systemic chemotherapy due to significant weakness when evaluated by his previous oncologist PET CT imaging show significant improvement of disease control with radiation treatment but now has signs of metastatic disease in the abdomen and liver PET/CT imaging also disclosed possible abnormal uptake in his colon I was able to secure documentation from his gastroenterologist office from 2019 He had normal colonoscopy evaluation on March 07, 2017 without abnormalities in his colon except for some diverticulosis Certainly, metastatic cancer of the tongue to the liver is a possibility but cancer from the abdomen is also a possibility The patient is known to have enlarged prostate I recommend we proceed with blood work and tumor marker as well as CT guided biopsy of the liver lesion for further evaluation The patient is aware he will need systemic chemotherapy given the abnormal findings most consistent with stage IV metastatic cancer I will see him back in 3 weeks for further follow-up  Orders Placed This Encounter  Procedures   CT Biopsy    Standing Status:   Future    Standing Expiration Date:   11/27/2021    Order Specific Question:   Lab orders requested (DO NOT place separate lab orders, these will be automatically ordered during procedure specimen collection):    Answer:   Cytology - Non Pap    Order Specific Question:   Lab orders requested (DO NOT place separate lab orders, these will be automatically ordered during procedure specimen collection):    Answer:   Surgical Pathology  Order Specific Question:   Reason for Exam (SYMPTOM  OR DIAGNOSIS REQUIRED)    Answer:   Hx head & neck cancer, recent PET scan showed liver lesion, concern for metastatic disease    Order Specific Question:   Preferred location?    Answer:   [place] ([place] only)    Standing Status:   Future    Number of Occurrences:   1    Standing Expiration Date:   11/27/2021   CBC with Differential (Cancer Center Only)    Standing Status:   Future    Number of Occurrences:   1    Standing Expiration Date:   11/27/2021   APTT    Standing Status:   Future    Number of Occurrences:   1    Standing Expiration Date:   11/27/2021   Protime-INR    Standing Status:   Future    Number of Occurrences:   1    Standing Expiration Date:   11/27/2021   Cancer antigen 19-9    Standing Status:   Future    Number of Occurrences:   1    Standing Expiration Date:   11/27/2021   CEA (IN HOUSE-[facility])    Standing Status:   Future    Number of Occurrences:   1    Standing Expiration Date:   11/27/2021   PSA, total and free    Standing Status:   Future    Number of Occurrences:   1    Standing Expiration Date:   11/27/2021   AFP tumor marker    Standing Status:   Future    Number of Occurrences:   1    Standing Expiration Date:   11/27/2021    All questions were answered. The patient knows to call the clinic with any problems, questions or concerns. The total time spent in the appointment was 40 minutes encounter with patients including review of chart and various tests results, discussions about plan of care and coordination of care plan   Heath Lark, MD 11/27/2020 4:11 PM

## 2020-11-28 LAB — PSA, TOTAL AND FREE
PSA, Free Pct: 25.9 %
PSA, Free: 0.44 ng/mL
Prostate Specific Ag, Serum: 1.7 ng/mL (ref 0.0–4.0)

## 2020-11-28 LAB — CANCER ANTIGEN 19-9: CA 19-9: 14 U/mL (ref 0–35)

## 2020-11-28 LAB — AFP TUMOR MARKER: AFP, Serum, Tumor Marker: 2.8 ng/mL (ref 0.0–8.4)

## 2020-12-01 ENCOUNTER — Encounter (HOSPITAL_COMMUNITY): Payer: Self-pay | Admitting: Radiology

## 2020-12-01 NOTE — Progress Notes (Signed)
Patient Name  Jerry Cook, Jerry Cook Legal Sex  Male DOB  [birth year] SSN  [id number] Address  [address] [address] Phone  [phone] [facility]) *Preferred*  [phone] (Mobile)    RE: CT Biopsy Received: Today Greggory Keen, MD  Kathrene Alu, MD [facility] for attempt at [place] left liver lesion bx.  May not be visible   New 1.5 cm anterior left lobe lesion on the recent PET   TS        Previous Messages   ----- Message -----  From: Garth Bigness D  Sent: 12/01/2020   3:37 PM EST  To: Ir Procedure Requests  Subject: FW: CT Biopsy                                   Still need review  ----- Message -----  From: Garth Bigness D  Sent: 11/27/2020   1:28 PM EST  To: [facility] Procedure Requests  Subject: CT Biopsy                                       Procedure:   CT Biopsy   Reason:  Squamous cell carcinoma of base of tongue, HTN (hypertension), benign, Lesion of colon, Hx head & neck cancer, recent PET scan showed liver lesion, concern for metastatic disease   History:  CT, NM PET in computer   Provider:  [facility], [place]   Provider Contact:  [phone]

## 2020-12-07 ENCOUNTER — Other Ambulatory Visit: Payer: Self-pay | Admitting: Internal Medicine

## 2020-12-08 ENCOUNTER — Other Ambulatory Visit: Payer: Self-pay | Admitting: Radiology

## 2020-12-08 ENCOUNTER — Other Ambulatory Visit: Payer: Self-pay | Admitting: Internal Medicine

## 2020-12-08 NOTE — Progress Notes (Signed)
Oncology Nurse Navigator Documentation   I left a voice mail with Jerry Cook last week and today reminding him about his scheduled biopsy tomorrow at 11:15. I informed him on his voice mail that he needed to arrive at 11:15 tomorrow to [facility] for his appointment. I also made him aware that he wasn't to drink anything after 7 am tomorrow and he needed a driver. I provided him with my direct contact information and asked him to call me back to confirm that he got my message.   [name] RN, BSN, OCN Head & Neck Oncology Nurse [place] at [facility] Phone # [phone]  Fax # [phone]

## 2020-12-09 ENCOUNTER — Ambulatory Visit (HOSPITAL_COMMUNITY)
Admission: RE | Admit: 2020-12-09 | Discharge: 2020-12-09 | Disposition: A | Discharge status: Home / Self Care | Payer: Medicare HMO | Source: Ambulatory Visit | Attending: Hematology and Oncology | Admitting: Hematology and Oncology

## 2020-12-09 ENCOUNTER — Other Ambulatory Visit: Payer: Self-pay

## 2020-12-09 DIAGNOSIS — R599 Enlarged lymph nodes, unspecified: Secondary | ICD-10-CM | POA: Insufficient documentation

## 2020-12-09 DIAGNOSIS — K639 Disease of intestine, unspecified: Secondary | ICD-10-CM

## 2020-12-09 DIAGNOSIS — E785 Hyperlipidemia, unspecified: Secondary | ICD-10-CM | POA: Diagnosis not present

## 2020-12-09 DIAGNOSIS — C029 Malignant neoplasm of tongue, unspecified: Secondary | ICD-10-CM | POA: Insufficient documentation

## 2020-12-09 DIAGNOSIS — K8020 Calculus of gallbladder without cholecystitis without obstruction: Secondary | ICD-10-CM | POA: Insufficient documentation

## 2020-12-09 DIAGNOSIS — Z923 Personal history of irradiation: Secondary | ICD-10-CM | POA: Insufficient documentation

## 2020-12-09 DIAGNOSIS — K219 Gastro-esophageal reflux disease without esophagitis: Secondary | ICD-10-CM | POA: Diagnosis not present

## 2020-12-09 DIAGNOSIS — C760 Malignant neoplasm of head, face and neck: Secondary | ICD-10-CM | POA: Diagnosis not present

## 2020-12-09 DIAGNOSIS — C787 Secondary malignant neoplasm of liver and intrahepatic bile duct: Secondary | ICD-10-CM | POA: Diagnosis not present

## 2020-12-09 DIAGNOSIS — C01 Malignant neoplasm of base of tongue: Secondary | ICD-10-CM

## 2020-12-09 DIAGNOSIS — I10 Essential (primary) hypertension: Secondary | ICD-10-CM | POA: Diagnosis not present

## 2020-12-09 DIAGNOSIS — I700 Atherosclerosis of aorta: Secondary | ICD-10-CM | POA: Diagnosis not present

## 2020-12-09 DIAGNOSIS — R160 Hepatomegaly, not elsewhere classified: Secondary | ICD-10-CM | POA: Diagnosis not present

## 2020-12-09 DIAGNOSIS — K769 Liver disease, unspecified: Secondary | ICD-10-CM | POA: Diagnosis present

## 2020-12-09 LAB — CBC
HCT: 33.9 % — ABNORMAL LOW (ref 39.0–52.0)
Hemoglobin: 11.2 g/dL — ABNORMAL LOW (ref 13.0–17.0)
MCH: 30.4 pg (ref 26.0–34.0)
MCHC: 33 g/dL (ref 30.0–36.0)
MCV: 92.1 fL (ref 80.0–100.0)
Platelets: 199 10*3/uL (ref 150–400)
RBC: 3.68 MIL/uL — ABNORMAL LOW (ref 4.22–5.81)
RDW: 13.6 % (ref 11.5–15.5)
WBC: 6.5 10*3/uL (ref 4.0–10.5)
nRBC: 0 % (ref 0.0–0.2)

## 2020-12-09 LAB — COMPREHENSIVE METABOLIC PANEL
ALT: 11 U/L (ref 0–44)
AST: 13 U/L — ABNORMAL LOW (ref 15–41)
Albumin: 3.6 g/dL (ref 3.5–5.0)
Alkaline Phosphatase: 53 U/L (ref 38–126)
Anion gap: 6 (ref 5–15)
BUN: 19 mg/dL (ref 8–23)
CO2: 32 mmol/L (ref 22–32)
Calcium: 9 mg/dL (ref 8.9–10.3)
Chloride: 100 mmol/L (ref 98–111)
Creatinine, Ser: 1.01 mg/dL (ref 0.61–1.24)
GFR, Estimated: >60 mL/min (ref >60)
Glucose, Bld: 85 mg/dL (ref 70–99)
Potassium: 4.1 mmol/L (ref 3.5–5.1)
Sodium: 138 mmol/L (ref 135–145)
Total Bilirubin: 0.6 mg/dL (ref 0.3–1.2)
Total Protein: 6.4 g/dL — ABNORMAL LOW (ref 6.5–8.1)

## 2020-12-09 LAB — PROTIME-INR
INR: 1.1 (ref 0.8–1.2)
Prothrombin Time: 14.4 seconds (ref 11.4–15.2)

## 2020-12-09 MED ORDER — MIDAZOLAM HCL 2 MG/2ML IJ SOLN
INTRAMUSCULAR | Status: AC
  Filled 2020-12-09: qty 2

## 2020-12-09 MED ORDER — FENTANYL CITRATE (PF) 100 MCG/2ML IJ SOLN
INTRAMUSCULAR | Status: AC
  Filled 2020-12-09: qty 2

## 2020-12-09 MED ORDER — GELATIN ABSORBABLE 12-7 MM EX MISC
CUTANEOUS | Status: AC
  Filled 2020-12-09: qty 1

## 2020-12-09 MED ORDER — MIDAZOLAM HCL 2 MG/2ML IJ SOLN
INTRAMUSCULAR | Status: AC | PRN
  Administered 2020-12-09 (×2): 1 mg via INTRAVENOUS

## 2020-12-09 MED ORDER — LIDOCAINE HCL 1 % IJ SOLN
INTRAMUSCULAR | Status: AC
  Filled 2020-12-09: qty 20

## 2020-12-09 MED ORDER — FENTANYL CITRATE (PF) 100 MCG/2ML IJ SOLN
INTRAMUSCULAR | Status: AC | PRN
  Administered 2020-12-09 (×2): 50 ug via INTRAVENOUS

## 2020-12-09 MED ORDER — SODIUM CHLORIDE 0.9 % IV SOLN: INTRAVENOUS | Status: DC

## 2020-12-09 NOTE — Discharge Instructions (Signed)
Interventional radiology phone numbers [phone] After hours [phone]  Liver Biopsy, Care After These instructions give you information on caring for yourself after your procedure. Your doctor may also give you more specific instructions. Call your doctor if you have any problems or questions after your procedure. What can I expect after the procedure? After the procedure, it is common to have: Pain and soreness where the biopsy was done. Bruising around the area where the biopsy was done. Sleepiness and be tired for a few days. Follow these instructions at home: Medicines Take over-the-counter and prescription medicines only as told by your doctor. If you were prescribed an antibiotic medicine, take it as told by your doctor. Do not stop taking the antibiotic even if you start to feel better. Do not take medicines such as aspirin and ibuprofen. These medicines can thin your blood. Do not take these medicines unless your doctor tells you to take them. If you are taking prescription pain medicine, take actions to prevent or treat constipation. Your doctor may recommend that you: Drink enough fluid to keep your pee (urine) clear or pale yellow. Take over-the-counter or prescription medicines. Eat foods that are high in fiber, such as fresh fruits and vegetables, whole grains, and beans. Limit foods that are high in fat and processed sugars, such as fried and sweet foods. Caring for your cut Follow instructions from your doctor about how to take care of your cuts from surgery (incisions). Make sure you: Wash your hands with soap and water before you change your bandage (dressing). If you cannot use soap and water, use hand sanitizer. Change your bandage as told by your doctor. Check your cuts every day for signs of infection. Check for: Redness, swelling, or more pain. Fluid or blood. Pus or a bad smell. Warmth. Do not take baths, swim, or use a hot tub until your doctor says it is  okay to do so. You may remove your dressing tomorrow and shower. Activity Rest at home for 1-2 days or as told by your doctor. Avoid sitting for a long time without moving. Get up to take short walks every 1-2 hours. Return to your normal activities as told by your doctor. Ask what activities are safe for you. Do not do these things in the first 24 hours: Drive. Use machinery. Take a bath or shower. Do not lift more than 10 pounds (4.5 kg) or play contact sports for the first 2 weeks.   General instructions Do not drink alcohol in the first week after the procedure. Have someone stay with you for at least 24 hours after the procedure. Get your test results. Ask your doctor or the department that is doing the test: When will my results be ready? How will I get my results? What are my treatment options? What other tests do I need? What are my next steps? Keep all follow-up visits as told by your doctor. This is important.   Contact a doctor if: A cut bleeds and leaves more than just a small spot of blood. A cut is red, puffs up (swells), or hurts more than before. Fluid or something else comes from a cut. A cut smells bad. You have a fever or chills. Get help right away if: You have swelling, bloating, or pain in your belly (abdomen). You get dizzy or faint. You have a rash. You feel sick to your stomach (nauseous) or throw up (vomit). You have trouble breathing, feel short of breath, or feel faint. Your chest   hurts. You have problems talking or seeing. You have trouble with your balance or moving your arms or legs. Summary After the procedure, it is common to have pain, soreness, bruising, and tiredness. Your doctor will tell you how to take care of yourself at home. Change your bandage, take your medicines, and limit your activities as told by your doctor. Call your doctor if you have symptoms of infection. Get help right away if your belly swells, your cut bleeds a lot, or you  have trouble talking or breathing. This information is not intended to replace advice given to you by your health care provider. Make sure you discuss any questions you have with your health care provider. Document Revised: 01/12/2017 Document Reviewed: 01/13/2017 Elsevier Patient Education  2021 Elsevier Inc.     Moderate Conscious Sedation, Adult, Care After This sheet gives you information about how to care for yourself after your procedure. Your health care provider may also give you more specific instructions. If you have problems or questions, contact your health care provider. What can I expect after the procedure? After the procedure, it is common to have: Sleepiness for several hours. Impaired judgment for several hours. Difficulty with balance. Vomiting if you eat too soon. Follow these instructions at home: For the time period you were told by your health care provider: Rest. Do not participate in activities where you could fall or become injured. Do not drive or use machinery. Do not drink alcohol. Do not take sleeping pills or medicines that cause drowsiness. Do not make important decisions or sign legal documents. Do not take care of children on your own.     Eating and drinking Follow the diet recommended by your health care provider. Drink enough fluid to keep your urine pale yellow. If you vomit: Drink water, juice, or soup when you can drink without vomiting. Make sure you have little or no nausea before eating solid foods.   General instructions Take over-the-counter and prescription medicines only as told by your health care provider. Have a responsible adult stay with you for the time you are told. It is important to have someone help care for you until you are awake and alert. Do not smoke. Keep all follow-up visits as told by your health care provider. This is important. Contact a health care provider if: You are still sleepy or having trouble with  balance after 24 hours. You feel light-headed. You keep feeling nauseous or you keep vomiting. You develop a rash. You have a fever. You have redness or swelling around the IV site. Get help right away if: You have trouble breathing. You have new-onset confusion at home. Summary After the procedure, it is common to feel sleepy, have impaired judgment, or feel nauseous if you eat too soon. Rest after you get home. Know the things you should not do after the procedure. Follow the diet recommended by your health care provider and drink enough fluid to keep your urine pale yellow. Get help right away if you have trouble breathing or new-onset confusion at home. This information is not intended to replace advice given to you by your health care provider. Make sure you discuss any questions you have with your health care provider. Document Revised: 05/03/2019 Document Reviewed: 11/29/2018 Elsevier Patient Education  2021 Elsevier Inc.  

## 2020-12-09 NOTE — Progress Notes (Signed)
Had to [facility] for [facility] in regards to pt leaving unit for procedure.

## 2020-12-09 NOTE — Progress Notes (Signed)
RN enters patient's room to find patient sitting on edge of bed drinking cola. RN explains need for bed rest to prevent bleeding from liver biopsy site. Patient sits up in bed to drink his cola. Instructed to lie down. Placed in reverse trendelenburg position.

## 2020-12-09 NOTE — Consult Note (Signed)
Chief Complaint: Patient was seen in consultation today for image guided liver lesion biopsy  Referring Physician(s): Heath Lark  Supervising Physician: Mir, Sharen Heck  Patient Status: [facility] - Out-pt  History of Present Illness: [name] is a 70 y.o. 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 He has undergone radiation therapy. PET scan performed on 11/04/20 revealed:   Marked interval response to therapy still with some increased metabolic activity associated with the mildly enlarged RIGHT neck lymph node.   Muscular activity elsewhere in the neck and sequela of post treatment change.   Unfortunately, there is evidence of hepatic metastatic disease and potential celiac nodal disease on the current study.   Findings of more focal uptake in the RIGHT colon. This finding is nonspecific but would consider correlation with recent colonoscopy if performed or with follow-up colonoscopy if not recently performed as warranted. Question of thickening though the colon is decompressed in this area.   Worsening of RIGHT maxillary sinus disease since previous imaging.   Cholelithiasis. Aortic atherosclerosis.   He presents today for image guided liver lesion biopsy for further evaluation.     Past Medical History:  Diagnosis Date   GERD (gastroesophageal reflux disease)    Hyperlipidemia    Hypertension     Past Surgical History:  Procedure Laterality Date   EYE SURGERY Bilateral    cataract   LARYNGOSCOPY AND BRONCHOSCOPY N/A 03/13/2020   Procedure: Direct LARYNGOSCOPY WITH BX  AND ESPHOGOSCOPY;  Surgeon: Melida Quitter, MD;  Location: [facility] OR;  Service: ENT;  Laterality: N/A;   TONSILLECTOMY      Allergies: Patient has no known allergies.  Medications: Prior to Admission medications   Medication Sig Start Date End Date Taking? Authorizing Provider  atorvastatin (LIPITOR) 10 MG tablet Take 10 mg by mouth at  bedtime. 01/23/18  Yes [provider]  bifidobacterium infantis (ALIGN) capsule Take 1 capsule by mouth daily.   Yes [provider]  cetirizine (ZYRTEC) 10 MG tablet Take 10 mg by mouth daily.   Yes [provider]  clonazePAM (KLONOPIN) 2 MG tablet Take 2 mg by mouth at bedtime. 01/26/18   [provider]  finasteride (PROSCAR) 5 MG tablet Take 5 mg by mouth daily.    [provider]  fluticasone (FLONASE) 50 MCG/ACT nasal spray Place 2 sprays into both nostrils as needed for allergies. Patient not taking: Reported on 03/25/2020    [provider]  HYDROcodone-acetaminophen (HYCET) 7.5-325 mg/15 ml solution Take 10-15 mLs by mouth every 4 (four) hours as needed for moderate pain. Take with food. Do not take before driving. 05/20/20   Eppie Gibson, MD  metoprolol tartrate (LOPRESSOR) 50 MG tablet Take 50 mg by mouth 2 (two) times daily. 03/05/18   [provider]  omeprazole (PRILOSEC) 20 MG capsule Take 20 mg by mouth daily. 03/05/18   [provider]  PARoxetine (PAXIL) 20 MG tablet Take 20 mg by mouth daily.    [provider]  sildenafil (REVATIO) 20 MG tablet Take 100 mg by mouth as needed (erectile dysfunction). Patient not taking: Reported on 03/25/2020    [provider]  tamsulosin (FLOMAX) 0.4 MG CAPS capsule Take 0.4 mg by mouth at bedtime.    [provider]  traZODone (DESYREL) 100 MG tablet Take 100 mg by mouth at bedtime. 03/07/18   [provider]     No family history on file.  Social  History   Socioeconomic History   Marital status: Single    Spouse name: Not on file   Number of children: Not on file   Years of education: Not on file   Highest education level: Not on file  Occupational History   Not on file  Tobacco Use   Smoking status: Former    Years: 13.00    Types: Cigarettes    Quit date: 03/24/1979    Years since quitting: 41.7   Smokeless tobacco: Never    Tobacco comments:    Quit in 1981  Vaping Use   Vaping Use: Never used  Substance and Sexual Activity   Alcohol use: Not Currently    Alcohol/week: 1.0 standard drink    Types: 1 Cans of beer per week    Comment: Weekly   Drug use: Never   Sexual activity: Not Currently  Other Topics Concern   Not on file  Social History Narrative   Not on file   Social Determinants of Health   Financial Resource Strain: Not on file  Food Insecurity: No Food Insecurity   Worried About Running Out of Food in the Last Year: Never true   [place] in the Last Year: Never true  Transportation Needs: No Transportation Needs   Lack of Transportation (Medical): No   Lack of Transportation (Non-Medical): No  Physical Activity: Inactive   Days of Exercise per Week: 0 days   Minutes of Exercise per Session: 0 min  Stress: Not on file  Social Connections: Socially Isolated   Frequency of Communication with Friends and Family: Never   Frequency of Social Gatherings with Friends and Family: Never   Attends Religious Services: Never   Marine scientist or Organizations: No   Attends Music therapist: Never   Marital Status: Never married      Review of Systems denies fever, HA,CP, dyspnea, cough, abd pain, back pain,N/V or bleeding  Vital Signs: Vitals:   12/09/20 1210  BP: (!) 123/51  Pulse: 91  Resp: 16  Temp: 98.2 F (36.8 C)  SpO2: 100%      Physical Exam awake/alert; chest- CTA bilat; heart- RRR; abd- soft,+BS,NT; no LE edema  Imaging: No results found.  Labs:  CBC: Recent Labs    05/25/20 1602 06/02/20 1509 06/08/20 1409 11/27/20 1249  WBC 7.7 5.9 6.3 6.9  HGB 12.6* 11.7* 11.9* 11.9*  HCT 38.1* 35.0* 35.4* 35.5*  PLT 196 163 184 194    COAGS: Recent Labs    03/03/20 1513 11/27/20 1249  INR 1.2 1.1  APTT  --  30    BMP: Recent Labs    05/25/20 1602 06/02/20 1509 06/08/20 1409 11/27/20 1249  NA 143 140 139 139  K 3.5 3.5 3.2*  4.0  CL 95* 95* 96* 101  CO2 34* 27 36* 29  GLUCOSE 103* 74 97 91  BUN 22 18 13 18   CALCIUM 9.7 9.6 9.2 9.2  CREATININE 1.72* 1.46* 1.38* 1.28*  GFRNONAA 42* 51* 55* 59*    LIVER FUNCTION TESTS: Recent Labs    03/03/20 1303 03/25/20 1238 11/27/20 1249  BILITOT 0.5 0.7 0.7  AST 27 20 11*  ALT 32 18 8  ALKPHOS 59 75 59  PROT 6.0* 7.1 6.5  ALBUMIN 3.3* 3.9 3.6    TUMOR MARKERS: No results for input(s): AFPTM, CEA, CA199, CHROMGRNA in the last 8760 hours.  Assessment and Plan: 70 y.o. 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 He has undergone radiation therapy. PET scan performed on 11/04/20 revealed:   Marked interval response to therapy still with some increased metabolic activity associated with the mildly enlarged RIGHT neck lymph node.   Muscular activity elsewhere in the neck and sequela of post treatment change.   Unfortunately, there is evidence of hepatic metastatic disease and potential celiac nodal disease on the current study.   Findings of more focal uptake in the RIGHT colon. This finding is nonspecific but would consider correlation with recent colonoscopy if performed or with follow-up colonoscopy if not recently performed as warranted. Question of thickening though the colon is decompressed in this area.   Worsening of RIGHT maxillary sinus disease since previous imaging.   Cholelithiasis. Aortic atherosclerosis.   He presents today for image guided liver lesion biopsy for further evaluation.Risks and benefits of procedure was discussed with the patient including, but not limited to bleeding, infection, damage to adjacent structures or low yield requiring additional tests.  All of the questions were answered and there is agreement to proceed.  Consent signed and in chart.    Thank Jerry for this interesting consult.  I greatly enjoyed meeting Jerry Cook and look forward to participating in  their care.  A copy of this report was sent to the requesting provider on this date.  Electronically Signed: D. Rowe Robert, PA-C 12/09/2020, 12:07 PM   I spent a total of  25 minutes   in face to face in clinical consultation, greater than 50% of which was counseling/coordinating care for image guided liver lesion biopsy

## 2020-12-09 NOTE — Progress Notes (Signed)
Pts contact person [facility] notified of IR delay and pt has not left unit for procedure at this time. Will keep updated.

## 2020-12-09 NOTE — Procedures (Signed)
Interventional Radiology Procedure Note  Procedure: US guided liver lesion biopsy  Indication: Head and Neck Ca.  Left liver lesion  Findings: Please refer to procedural dictation for full description.  Complications: None  EBL: < 10 mL  Miachel Roux, MD [phone]

## 2020-12-11 LAB — SURGICAL PATHOLOGY

## 2020-12-15 NOTE — Progress Notes (Signed)
Oncology Nurse Navigator Documentation   I left a voice mail with Jerry Cook reminding him of his appointment with Dr. Alvy Bimler Cook 12/17/20 at 11:00. I provided my direct contact information if he needed to call me back with any questions or concerns.   [name] RN, BSN, OCN Head & Neck Oncology Nurse [place] [facility] Phone # [phone]  Fax # [phone]

## 2020-12-17 ENCOUNTER — Inpatient Hospital Stay: Payer: Medicare HMO | Attending: Hematology and Oncology | Admitting: Hematology and Oncology

## 2020-12-17 ENCOUNTER — Encounter: Payer: Self-pay | Admitting: Hematology and Oncology

## 2020-12-17 ENCOUNTER — Telehealth: Payer: Self-pay | Admitting: *Deleted

## 2020-12-17 ENCOUNTER — Other Ambulatory Visit: Payer: Self-pay

## 2020-12-17 VITALS — BP 92/45 | HR 55 | Temp 98.0°F | Resp 17 | Ht 70.0 in | Wt 132.2 lb

## 2020-12-17 DIAGNOSIS — C01 Malignant neoplasm of base of tongue: Secondary | ICD-10-CM | POA: Insufficient documentation

## 2020-12-17 DIAGNOSIS — K8020 Calculus of gallbladder without cholecystitis without obstruction: Secondary | ICD-10-CM | POA: Insufficient documentation

## 2020-12-17 DIAGNOSIS — Z79899 Other long term (current) drug therapy: Secondary | ICD-10-CM | POA: Diagnosis not present

## 2020-12-17 DIAGNOSIS — Z7189 Other specified counseling: Secondary | ICD-10-CM | POA: Diagnosis not present

## 2020-12-17 DIAGNOSIS — R634 Abnormal weight loss: Secondary | ICD-10-CM | POA: Insufficient documentation

## 2020-12-17 DIAGNOSIS — C787 Secondary malignant neoplasm of liver and intrahepatic bile duct: Secondary | ICD-10-CM | POA: Insufficient documentation

## 2020-12-17 DIAGNOSIS — I700 Atherosclerosis of aorta: Secondary | ICD-10-CM | POA: Insufficient documentation

## 2020-12-17 DIAGNOSIS — I517 Cardiomegaly: Secondary | ICD-10-CM | POA: Insufficient documentation

## 2020-12-17 DIAGNOSIS — I2510 Atherosclerotic heart disease of native coronary artery without angina pectoris: Secondary | ICD-10-CM | POA: Insufficient documentation

## 2020-12-17 MED ORDER — PROCHLORPERAZINE MALEATE 10 MG PO TABS: 10.0000 mg | ORAL_TABLET | Freq: Four times a day (QID) | ORAL | 1 refills | Status: DC | PRN

## 2020-12-17 MED ORDER — ONDANSETRON HCL 8 MG PO TABS: 8.0000 mg | ORAL_TABLET | Freq: Two times a day (BID) | ORAL | 1 refills | Status: DC | PRN

## 2020-12-17 MED ORDER — LIDOCAINE-PRILOCAINE 2.5-2.5 % EX CREA: TOPICAL_CREAM | CUTANEOUS | 3 refills | Status: DC

## 2020-12-17 NOTE — Progress Notes (Signed)
START ON PATHWAY REGIMEN - Head and Neck     A cycle is every 21 days:     Carboplatin      Fluorouracil      Pembrolizumab   **Always confirm dose/schedule in your pharmacy ordering system**  Patient Characteristics: Oropharynx, HPV Positive, Metastatic, First Line, No Prior Platinum-Based Chemoradiation within 6 Months, PD-L1 Expression Negative (CPS < 1) / Unknown Disease Classification: Oropharynx HPV Status: Positive (+) Therapeutic Status: Metastatic Disease Line of Therapy: First Line Prior Platinum Status: No Prior Platinum-Based Chemoradiation within 6 Months PD-L1 Expression Status: Did Not Order Test Intent of Therapy: Non-Curative / Palliative Intent, Discussed with Patient

## 2020-12-17 NOTE — Progress Notes (Signed)
[place] OFFICE PROGRESS NOTE  Patient Care Team: Shirline Frees, MD as PCP - General (Family Medicine) Eppie Gibson, MD as Consulting Physician (Radiation Oncology) Malmfelt, Stephani Police, RN as Oncology Nurse Navigator  ASSESSMENT & PLAN:  Squamous cell carcinoma of base of tongue [facility]) I reviewed pathology result with the patient Unfortunately, he has now stage IV metastatic cancer to the liver He appears to have very poor understanding of his disease process Despite this, he has clear understanding what would happen if he does not pursue treatment The patient wants to go with aggressive treatment I am concerned about his previous noncompliance For example, today, he is 1 hour late for his appointment Ultimately, he wants to try palliative chemotherapy He wants to try aggressive combination treatment We discussed the risk, benefits, side effects of carboplatin, 5-FU and pembrolizumab infusion The patient stated he understood the risk but it is not clear to me at the end of the day he truly understood all the implication of being on aggressive treatment I will order port placement and chemo education class I will schedule a tentative date to start treatment on December to 12  Goals of care, counseling/discussion He is made aware the rationale why surgery or radiation treatment is not indicated for metastatic stage IV disease to the liver He understood that the goal and intent of treatment of palliative treatment is to prolong his life He understood the risks of treatment We discussed prognosis even with best treatment would be under 2 years and he wants to proceed  Orders Placed This Encounter  Procedures   IR IMAGING GUIDED PORT INSERTION    Standing Status:   Future    Standing Expiration Date:   12/17/2021    Order Specific Question:   Reason for Exam (SYMPTOM  OR DIAGNOSIS REQUIRED)    Answer:   need port for chemo    Order Specific Question:   Preferred  Imaging Location?    Answer:   [facility]   CBC with Differential ([place] Only)    Standing Status:   Standing    Number of Occurrences:   20    Standing Expiration Date:   12/17/2021   CMP ([place] only)    Standing Status:   Standing    Number of Occurrences:   20    Standing Expiration Date:   12/17/2021   T4    Standing Status:   Standing    Number of Occurrences:   20    Standing Expiration Date:   12/17/2021   TSH    Standing Status:   Standing    Number of Occurrences:   20    Standing Expiration Date:   12/17/2021    All questions were answered. The patient knows to call the clinic with any problems, questions or concerns. The total time spent in the appointment was 40 minutes encounter with patients including review of chart and various tests results, discussions about plan of care and coordination of care plan   Heath Lark, MD 12/17/2020 12:53 PM  INTERVAL HISTORY: Please see below for problem oriented charting. he returns for treatment follow-up and review of recent biopsy results He denies pain He has gained some weight since last time I saw him  REVIEW OF SYSTEMS:   Constitutional: Denies fevers, chills or abnormal weight loss Eyes: Denies blurriness of vision Ears, nose, mouth, throat, and face: Denies mucositis or sore throat Respiratory: Denies cough, dyspnea or wheezes Cardiovascular: Denies  palpitation, chest discomfort or lower extremity swelling Gastrointestinal:  Denies nausea, heartburn or change in bowel habits Skin: Denies abnormal skin rashes Lymphatics: Denies new lymphadenopathy or easy bruising Neurological:Denies numbness, tingling or new weaknesses Behavioral/Psych: Mood is stable, no new changes  All other systems were reviewed with the patient and are negative.  I have reviewed the past medical history, past surgical history, social history and family history with the patient and they are unchanged from previous  note.  ALLERGIES:  has No Known Allergies.  MEDICATIONS:  Current Outpatient Medications  Medication Sig Dispense Refill   atorvastatin (LIPITOR) 10 MG tablet Take 10 mg by mouth at bedtime.     bifidobacterium infantis (ALIGN) capsule Take 1 capsule by mouth daily.     cetirizine (ZYRTEC) 10 MG tablet Take 10 mg by mouth daily.     clonazePAM (KLONOPIN) 2 MG tablet Take 2 mg by mouth at bedtime.     finasteride (PROSCAR) 5 MG tablet Take 5 mg by mouth daily.     fluticasone (FLONASE) 50 MCG/ACT nasal spray Place 2 sprays into both nostrils as needed for allergies.     lidocaine-prilocaine (EMLA) cream Apply to affected area once 30 g 3   metoprolol tartrate (LOPRESSOR) 50 MG tablet Take 50 mg by mouth 2 (two) times daily.     omeprazole (PRILOSEC) 20 MG capsule Take 20 mg by mouth daily.     ondansetron (ZOFRAN) 8 MG tablet Take 1 tablet (8 mg total) by mouth 2 (two) times daily as needed for refractory nausea / vomiting. Start on day 3 after carboplatin chemo. 30 tablet 1   PARoxetine (PAXIL) 20 MG tablet Take 20 mg by mouth daily.     prochlorperazine (COMPAZINE) 10 MG tablet Take 1 tablet (10 mg total) by mouth every 6 (six) hours as needed (Nausea or vomiting). 30 tablet 1   tamsulosin (FLOMAX) 0.4 MG CAPS capsule Take 0.4 mg by mouth at bedtime.     traZODone (DESYREL) 100 MG tablet Take 100 mg by mouth at bedtime.     No current facility-administered medications for this visit.    SUMMARY OF ONCOLOGIC HISTORY: Oncology History Overview Note  HPV positive   Squamous cell carcinoma of base of tongue (HCC)  03/03/2020 Imaging   CT neck  2.5 x 1.9 x 3.0 cm right base of tongue and glossotonsillar sulcus mass extending to the region of the right vallecula, highly suspicious for a squamous cell carcinoma primary tumor. ENT consultation and direct visualization recommended.   Extensive bilateral cystic/necrotic nodal metastatic disease at the right level 1, 2, 3 and 4 stations and  left level 2 station. Several lymph nodes demonstrate ill-defined margins highly suspicious for extracapsular extension.   Lymphadenopathy significantly narrows the upper internal jugular veins bilaterally.   Paranasal sinus disease as described.   03/13/2020 Pathology Results   SURGICAL PATHOLOGY   FINAL MICROSCOPIC DIAGNOSIS:   A. TONGUE, RIGHT BASE MASS, BIOPSY:  - Squamous cell carcinoma.  - See comment.   COMMENT:  Immunohistochemistry will be performed and reported as an addendum.   ADDENDUM: Immunohistochemistry for p16 is strongly and diffusely positive.    03/25/2020 Initial Diagnosis   Squamous cell carcinoma of base of tongue ([place])   03/25/2020 Cancer Staging   Staging form: Pharynx - HPV-Mediated Oropharynx, AJCC 8th Edition - Clinical stage from 03/25/2020: Stage IV (rcT3, cN2, pM1, p16+) - Signed by Heath Lark, MD on 12/17/2020 Stage prefix: Recurrence    04/10/2020 PET scan  1. Hypermetabolic mass of the right tongue base and palatine tonsillar region, with extensive hypermetabolic right neck adenopathy and some hypermetabolic left neck adenopathy as well as a small but hypermetabolic right supraclavicular lymph node as detailed above. 2. Questionable focus of accentuated activity along the dome of the liver, probably incidental. If the patient has abnormal liver enzymes or if otherwise clinically warranted, hepatic protocol MRI with and without contrast could be utilized to assess for an actual underlying lesion in this region. 3. Accentuated anorectal activity. This is most commonly physiologic, but correlate with patient's colon screening history in assessing whether further workup is indicated. 4. Other imaging findings of potential clinical significance: Chronic right maxillary sinusitis. Aortic Atherosclerosis (ICD10-I70.0). Coronary atherosclerosis with mild cardiomegaly. Prostatomegaly. Sludge versus small gallstones in the gallbladder.     04/27/2020 - 07/03/2020  Radiation Therapy   Radiation treatment dates:   04/27/2020 to 07/03/2020   Site/dose:   The patient received intensity modulated radiation therapy to the oropharynx and bilateral neck.   Beams/energy:   IMRT, 6 MV photons   Narrative: The patient tolerated radiation treatment relatively well.   However, he missed multiple treatments despite intensive navigation by our care team and ultimately elected to stop radiation early [place].  He received 60 Gray in 30 fractions rather than the prescribed total dose of 70 Gray in 35 fractions   11/04/2020 PET scan   Marked interval response to therapy still with some increased metabolic activity associated with the mildly enlarged RIGHT neck lymph node.    Muscular activity elsewhere in the neck and sequela of post treatment change.   Unfortunately, there is evidence of hepatic metastatic disease and potential celiac nodal disease on the current study.   Findings of more focal uptake in the RIGHT colon. This finding is nonspecific but would consider correlation with recent colonoscopy if performed or with follow-up colonoscopy if not recently performed as warranted. Question of thickening though the colon is decompressed in this area.   Worsening of RIGHT maxillary sinus disease since previous imaging.   Cholelithiasis.   Aortic Atherosclerosis (ICD10-I70.0).   12/08/2020 Procedure   Ultrasound-guided biopsy of left liver mass as above.   12/14/2020 Pathology Results   - Metastatic carcinoma to liver, consistent with patient's clinical  history of primary head and neck squamous cell carcinoma   12/28/2020 -  Chemotherapy   Patient is on Treatment Plan : HEAD/NECK Pembrolizumab + Carboplatin + 5FU q21d x 6 cycles / Pembrolizumab q21d     Liver metastases ([place])  12/17/2020 Initial Diagnosis   Liver metastases ([place])   12/28/2020 -  Chemotherapy   Patient is on Treatment Plan : HEAD/NECK Pembrolizumab + Carboplatin + 5FU q21d x  6 cycles / Pembrolizumab q21d       PHYSICAL EXAMINATION: ECOG PERFORMANCE STATUS: 2 - Symptomatic, <50% confined to bed  Vitals:   12/17/20 1203  BP: (!) 92/45  Pulse: (!) 55  Resp: 17  Temp: 98 F (36.7 C)  SpO2: 100%   Filed Weights   12/17/20 1203  Weight: 132 lb 3.2 oz (60 kg)    GENERAL:alert, no distress and comfortable.  He looks thin, weak and frail SKIN: skin color, texture, turgor are normal, no rashes or significant lesions EYES: normal, Conjunctiva are pink and non-injected, sclera clear OROPHARYNX:no exudate, no erythema and lips, buccal mucosa, and tongue normal  NECK: supple, thyroid normal size, non-tender, without nodularity LYMPH:  no palpable lymphadenopathy in the cervical, axillary or inguinal LUNGS:  clear to auscultation and percussion with normal breathing effort HEART: regular rate & rhythm and no murmurs and no lower extremity edema ABDOMEN:abdomen soft, non-tender and normal bowel sounds Musculoskeletal:no cyanosis of digits and no clubbing  NEURO: alert & oriented x 3 with fluent speech, no focal motor/sensory deficits  LABORATORY DATA:  I have reviewed the data as listed    Component Value Date/Time   NA 138 12/09/2020 1155   K 4.1 12/09/2020 1155   CL 100 12/09/2020 1155   CO2 32 12/09/2020 1155   GLUCOSE 85 12/09/2020 1155   BUN 19 12/09/2020 1155   CREATININE 1.01 12/09/2020 1155   CREATININE 1.28 (H) 11/27/2020 1249   CALCIUM 9.0 12/09/2020 1155   PROT 6.4 (L) 12/09/2020 1155   ALBUMIN 3.6 12/09/2020 1155   AST 13 (L) 12/09/2020 1155   AST 11 (L) 11/27/2020 1249   ALT 11 12/09/2020 1155   ALT 8 11/27/2020 1249   ALKPHOS 53 12/09/2020 1155   BILITOT 0.6 12/09/2020 1155   BILITOT 0.7 11/27/2020 1249   GFRNONAA >60 12/09/2020 1155   GFRNONAA 59 (L) 11/27/2020 1249    No results found for: SPEP, UPEP  Lab Results  Component Value Date   WBC 6.5 12/09/2020   NEUTROABS 5.2 11/27/2020   HGB 11.2 (L) 12/09/2020   HCT 33.9 (L)  12/09/2020   MCV 92.1 12/09/2020   PLT 199 12/09/2020      Chemistry      Component Value Date/Time   NA 138 12/09/2020 1155   K 4.1 12/09/2020 1155   CL 100 12/09/2020 1155   CO2 32 12/09/2020 1155   BUN 19 12/09/2020 1155   CREATININE 1.01 12/09/2020 1155   CREATININE 1.28 (H) 11/27/2020 1249      Component Value Date/Time   CALCIUM 9.0 12/09/2020 1155   ALKPHOS 53 12/09/2020 1155   AST 13 (L) 12/09/2020 1155   AST 11 (L) 11/27/2020 1249   ALT 11 12/09/2020 1155   ALT 8 11/27/2020 1249   BILITOT 0.6 12/09/2020 1155   BILITOT 0.7 11/27/2020 1249

## 2020-12-17 NOTE — Telephone Encounter (Signed)
Per Dr.Gorsuch, called to f/u with pt about no showing for office visit. There was no answer for either phone numbers that were called. Advised pt to office to reschedule

## 2020-12-17 NOTE — Assessment & Plan Note (Signed)
He is made aware the rationale why surgery or radiation treatment is not indicated for metastatic stage IV disease to the liver He understood that the goal and intent of treatment of palliative treatment is to prolong his life He understood the risks of treatment We discussed prognosis even with best treatment would be under 2 years and he wants to proceed

## 2020-12-17 NOTE — Assessment & Plan Note (Signed)
I reviewed pathology result with the patient Unfortunately, he has now stage IV metastatic cancer to the liver He appears to have very poor understanding of his disease process Despite this, he has clear understanding what would happen if he does not pursue treatment The patient wants to go with aggressive treatment I am concerned about his previous noncompliance For example, today, he is 1 hour late for his appointment Ultimately, he wants to try palliative chemotherapy He wants to try aggressive combination treatment We discussed the risk, benefits, side effects of carboplatin, 5-FU and pembrolizumab infusion The patient stated he understood the risk but it is not clear to me at the end of the day he truly understood all the implication of being on aggressive treatment I will order port placement and chemo education class I will schedule a tentative date to start treatment on December to 12

## 2020-12-18 NOTE — Progress Notes (Signed)
Oncology Nurse Navigator Documentation   Mr. Williard returned my call and left me a voice mail that he received my message and is aware of his appointments on 12/24/20 and 12/25/20.   [name] RN, BSN, OCN Head & Neck Oncology Nurse [place] at [facility] Phone # [phone]  Fax # [phone]

## 2020-12-18 NOTE — Progress Notes (Signed)
Oncology Nurse Navigator Documentation   I called and left a detailed voice mail message for Jerry Cook. I explained his upcoming appointments and times on 12/8 and 12/9. I left my direct contact information and asked him to call me back to make sure he understood the message I left him. I will follow up on Monday and attempt to call him again if I haven't heard from him.   [name] RN, BSN, OCN Head & Neck Oncology Nurse [place] at [facility] Phone # [phone]  Fax # [phone]

## 2020-12-22 ENCOUNTER — Other Ambulatory Visit: Payer: Self-pay | Admitting: Radiology

## 2020-12-24 ENCOUNTER — Telehealth: Payer: Self-pay

## 2020-12-24 ENCOUNTER — Ambulatory Visit (HOSPITAL_COMMUNITY): Admission: RE | Admit: 2020-12-24 | Payer: Medicare HMO | Source: Ambulatory Visit

## 2020-12-24 NOTE — Telephone Encounter (Signed)
Called regarding missed port placement appt this morning. He said that he slept thru the appt and he cannot get up that early. He is asking for port appt after 1200. Sent Anderson Malta the navigator a message. Told chemo appt and follow up with Dr. Alvy Bimler canceled. He is unable to get chemo until he has a port. Will reschedule once he has the port. He is planning on keeping chemo education appt tomorrow at 2 pm, reminded of appt time. He verbalized understanding.

## 2020-12-24 NOTE — Progress Notes (Signed)
Oncology Nurse Navigator Documentation   Mr. Buch did not go to his scheduled PAC placement this am at [facility]. I notified Dr. Alvy Bimler and her nurse of this and Dr. Calton Dach nurse Hassan Rowan called him and he stated that he overslept and apologized. I have rescheduled him for 01/01/21 at 2 pm arrival time 12:00 and informed him of this appointment on his voice mail. I provided my direct contact information to call me back and I also plan to meet with him when he is here at the [facility] for chemotherapy education.   [name] RN, BSN, OCN Head & Neck Oncology Nurse [place] at [facility] Phone # [phone]  Fax # [phone]

## 2020-12-25 ENCOUNTER — Inpatient Hospital Stay: Payer: Medicare HMO

## 2020-12-25 NOTE — Progress Notes (Addendum)
Oncology Nurse Navigator Documentation   Jerry Cook did not show for his chemotherapy education today at 2pm. I called and spoke with him and asked him to write down the two appointments he has scheduled for next week. I explained that he is scheduled for his chemotherapy education here at [facility] on 12/13 at 4pm and he is scheduled at [facility] on 12/16 for his PAC. He is aware that for the 12/16 appointment he needs to go to the main entrance at [facility], have nothing to drink after midnight the night before and he needs a driver to bring him home from the procedure. He verbalized his understanding and told me that he had written down the appointments. I will call him early next week to remind him as well.   [name] RN, BSN, OCN Head & Neck Oncology Nurse [place] at [facility] Phone # [phone]  Fax # [phone]

## 2020-12-28 ENCOUNTER — Inpatient Hospital Stay: Payer: Medicare HMO | Admitting: Hematology and Oncology

## 2020-12-28 ENCOUNTER — Inpatient Hospital Stay: Payer: Medicare HMO

## 2020-12-29 ENCOUNTER — Inpatient Hospital Stay: Payer: Medicare HMO

## 2020-12-29 ENCOUNTER — Telehealth: Payer: Self-pay | Admitting: *Deleted

## 2020-12-29 NOTE — Telephone Encounter (Signed)
Called pt since he had not shown for his education appt.  I had talked with him yesterday & reminded about rescheduled appts.  H & N Navigator, [employer] had also called pt.  Pt state he writes things down on a legal pad but then can't make heads or tails out of what he has written.  Reminded about port appt tomorrow & asked if he wrote this down & he says he did.  Asked why he is missing appts & did he want to get treatment.  He didn't give a reason for missing appt but said that he wanted to get treatment.  Informed that he needs to come for appts if he wants treatment.  I had also discussed transportation with him yesterday & gave him # to call but apparently he didn't call.  He says he has all kinds of #'s written down but doesn't know what they are.  This # was given to him again.  Message to Dr Marlinda Mike RN/Jennifer Malmfelt.  I am not sure that this pt is a candidate for treatment based on non-compliance.  Sounds like some mental issues going on.   Called & spoke with friend listed in patient contact, [place].  She doesn't sound like she is real close to him but knows a little about him.  She hasn't seen him in a while & states that she has helped him at times with transportation but lots of time he wouldn't even be ready. She says she doesn't know if he sleeps during the day or what is going on with him.  Will route to Social Workers also for suggestions.

## 2020-12-30 ENCOUNTER — Encounter: Payer: Self-pay | Admitting: Hematology and Oncology

## 2020-12-30 ENCOUNTER — Other Ambulatory Visit: Payer: Self-pay | Admitting: Hematology and Oncology

## 2020-12-30 NOTE — Progress Notes (Signed)
This patient has numerous no-shows for port and chemo education class I seriously doubt his ability to tolerate combination chemotherapy I will cancel his previous regimen I will defer him back to Dr. Chryl Heck for follow-up and treatment plan

## 2020-12-31 ENCOUNTER — Other Ambulatory Visit: Payer: Self-pay | Admitting: Radiology

## 2021-01-01 ENCOUNTER — Inpatient Hospital Stay: Payer: Medicare HMO

## 2021-01-01 ENCOUNTER — Ambulatory Visit (HOSPITAL_COMMUNITY): Admission: RE | Admit: 2021-01-01 | Payer: Medicare HMO | Source: Ambulatory Visit

## 2021-01-01 NOTE — Progress Notes (Signed)
Oncology Nurse Navigator Documentation   Jerry Cook did not present for his scheduled PAC placement today at [facility]. He had written down the appointment date and time when I spoke to him previously on the phone. He is scheduled to see Jerry Cook on 12/22 at [facility] for evaluation. I left a voice mail with Jerry Cook just now explaining that if he would like treatment for his metastatic head and neck cancer he needs to come to the appointment on 12/22 as scheduled. I left my direct contact number to call if he had any questions.   [name] RN, BSN, OCN Head & Neck Oncology Nurse [place] at [facility] Phone # [phone]  Fax # [phone]

## 2021-01-06 ENCOUNTER — Other Ambulatory Visit: Payer: Self-pay

## 2021-01-06 DIAGNOSIS — C01 Malignant neoplasm of base of tongue: Secondary | ICD-10-CM

## 2021-01-06 DIAGNOSIS — K148 Other diseases of tongue: Secondary | ICD-10-CM

## 2021-01-06 DIAGNOSIS — R634 Abnormal weight loss: Secondary | ICD-10-CM

## 2021-01-06 DIAGNOSIS — K639 Disease of intestine, unspecified: Secondary | ICD-10-CM

## 2021-01-06 DIAGNOSIS — C787 Secondary malignant neoplasm of liver and intrahepatic bile duct: Secondary | ICD-10-CM

## 2021-01-06 NOTE — Progress Notes (Signed)
Oncology Nurse Navigator Documentation   I called Jerry Cook and left a voice mail reminding him of his appointment with Dr. Chryl Heck tomorrow at 9:00. I left my direct contact information for a call back, if needed.  [name] RN, BSN, OCN Head & Neck Oncology Nurse [place] at [facility] Phone # [phone]  Fax # [phone]

## 2021-01-06 NOTE — Progress Notes (Signed)
Oncology Nurse Navigator Documentation   At the request of Dr. Chryl Heck, I have requested PDL1 testing on Mr. Drewes 12/09/20 Left liver mass biopsy from [facility].  [name] RN, BSN, OCN Head & Neck Oncology Nurse [place] at [facility] Phone # [phone]  Fax # [phone]

## 2021-01-06 NOTE — Progress Notes (Signed)
[place] OFFICE PROGRESS NOTE  Patient Care Team: Shirline Frees, MD as PCP - General (Family Medicine) Eppie Gibson, MD as Consulting Physician (Radiation Oncology) Malmfelt, Stephani Police, RN as Oncology Nurse Navigator  ASSESSMENT & PLAN:  Squamous cell carcinoma of base of tongue ([place]) I have clearly explained that he has advanced squamous cell carcinoma now metastatic to the liver based on biopsy results.  From his last discussion with Dr. Girtha Rm surgery, he was interested in proceeding with aggressive treatment.  Initially plan was to try combination chemoimmunotherapy however he no-show to his port appointments multiple times since this was discontinued.  I explained to him that the intent of treatment is palliative.  We once again discussed adverse effects of chemotherapy including but not limited to fatigue, nausea, vomiting, diarrhea, increased risk of infections, neuropathy etc.  I recommend starting weekly CarboTaxol and adding immunotherapy later, PD-L1 testing was sent.  I would also like to start this chemotherapy regimen to assess his tolerance since he is very frail at baseline.  He understands that if he no-shows to his appointments, we will discontinue chemotherapy and we will recommend palliative care and comfort measures. He again agrees to show up to his appointments.  He will get his port done and return to clinic for follow-up with me in 2 weeks.  I have not requested scheduling chemotherapy at this time since patient has no showed multiple times in the past.  I will get this taken care of once the port is placed.  Weight loss, unintentional Ongoing weight loss, likely because of reduced appetite.   I encouraged him to try energy drinks and will also engage him with nutrition again for follow-up.  Goals of care, counseling/discussion I have clearly discussed the advanced nature of the disease and the known curative intent of treatment.  I have discussed that if  he continues to no-show or remain noncompliant with recommendations, we may have to proceed with palliative care and comfort measures.  He expressed understanding.  No orders of the defined types were placed in this encounter.   All questions were answered. The patient knows to call the clinic with any problems, questions or concerns.   Benay Eimers, MD 01/07/2021 11:01 AM  INTERVAL HISTORY:  Mr Stangelo is here for a follow up by himself. Our NN Anderson Malta joined me for this appointment. When he saw Dr. Simeon Craft such, he was interested in proceeding with chemotherapy hence he was recommended to get a port placed however he no showed to his appointments.  He now tells me that he is interested in proceeding with treatments and he will try his best to make it to the appointments.  He denies any pain today.  He feels a bit more tired and may have lost some weight over the past few months but he could not quantitate it.  No difficulty swallowing or change in breathing.  No change in bowel habits. He lives by himself, has a dog. No extensive social support, has a friend who can help bring him on days he need help Rest of the pertinent 10 point ROS reviewed and negative  I have reviewed the past medical history, past surgical history, social history and family history with the patient and they are unchanged from previous note.  ALLERGIES:  has No Known Allergies.  MEDICATIONS:  Current Outpatient Medications  Medication Sig Dispense Refill   atorvastatin (LIPITOR) 10 MG tablet Take 10 mg by mouth at bedtime.  bifidobacterium infantis (ALIGN) capsule Take 1 capsule by mouth daily.     cetirizine (ZYRTEC) 10 MG tablet Take 10 mg by mouth daily.     clonazePAM (KLONOPIN) 2 MG tablet Take 2 mg by mouth at bedtime.     finasteride (PROSCAR) 5 MG tablet Take 5 mg by mouth daily.     fluticasone (FLONASE) 50 MCG/ACT nasal spray Place 2 sprays into both nostrils as needed for allergies.     metoprolol  tartrate (LOPRESSOR) 50 MG tablet Take 50 mg by mouth 2 (two) times daily.     omeprazole (PRILOSEC) 20 MG capsule Take 20 mg by mouth daily.     PARoxetine (PAXIL) 20 MG tablet Take 20 mg by mouth daily.     tamsulosin (FLOMAX) 0.4 MG CAPS capsule Take 0.4 mg by mouth at bedtime.     traZODone (DESYREL) 100 MG tablet Take 100 mg by mouth at bedtime.     No current facility-administered medications for this visit.    SUMMARY OF ONCOLOGIC HISTORY: Oncology History Overview Note  HPV positive   Squamous cell carcinoma of base of tongue (HCC)  03/03/2020 Imaging   CT neck  2.5 x 1.9 x 3.0 cm right base of tongue and glossotonsillar sulcus mass extending to the region of the right vallecula, highly suspicious for a squamous cell carcinoma primary tumor. ENT consultation and direct visualization recommended.   Extensive bilateral cystic/necrotic nodal metastatic disease at the right level 1, 2, 3 and 4 stations and left level 2 station. Several lymph nodes demonstrate ill-defined margins highly suspicious for extracapsular extension.   Lymphadenopathy significantly narrows the upper internal jugular veins bilaterally.   Paranasal sinus disease as described.   03/13/2020 Pathology Results   SURGICAL PATHOLOGY   FINAL MICROSCOPIC DIAGNOSIS:   A. TONGUE, RIGHT BASE MASS, BIOPSY:  - Squamous cell carcinoma.  - See comment.   COMMENT:  Immunohistochemistry will be performed and reported as an addendum.   ADDENDUM: Immunohistochemistry for p16 is strongly and diffusely positive.    03/25/2020 Initial Diagnosis   Squamous cell carcinoma of base of tongue ([place])   03/25/2020 Cancer Staging   Staging form: Pharynx - HPV-Mediated Oropharynx, AJCC 8th Edition - Clinical stage from 03/25/2020: Stage IV (rcT3, cN2, pM1, p16+) - Signed by Heath Lark, MD on 12/17/2020 Stage prefix: Recurrence    04/10/2020 PET scan   1. Hypermetabolic mass of the right tongue base and palatine tonsillar region,  with extensive hypermetabolic right neck adenopathy and some hypermetabolic left neck adenopathy as well as a small but hypermetabolic right supraclavicular lymph node as detailed above. 2. Questionable focus of accentuated activity along the dome of the liver, probably incidental. If the patient has abnormal liver enzymes or if otherwise clinically warranted, hepatic protocol MRI with and without contrast could be utilized to assess for an actual underlying lesion in this region. 3. Accentuated anorectal activity. This is most commonly physiologic, but correlate with patient's colon screening history in assessing whether further workup is indicated. 4. Other imaging findings of potential clinical significance: Chronic right maxillary sinusitis. Aortic Atherosclerosis (ICD10-I70.0). Coronary atherosclerosis with mild cardiomegaly. Prostatomegaly. Sludge versus small gallstones in the gallbladder.     04/27/2020 - 07/03/2020 Radiation Therapy   Radiation treatment dates:   04/27/2020 to 07/03/2020   Site/dose:   The patient received intensity modulated radiation therapy to the oropharynx and bilateral neck.   Beams/energy:   IMRT, 6 MV photons   Narrative: The patient tolerated radiation treatment relatively well.  However, he missed multiple treatments despite intensive navigation by our care team and ultimately elected to stop radiation early [place].  He received 60 Gray in 30 fractions rather than the prescribed total dose of 70 Gray in 35 fractions   11/04/2020 PET scan   Marked interval response to therapy still with some increased metabolic activity associated with the mildly enlarged RIGHT neck lymph node.    Muscular activity elsewhere in the neck and sequela of post treatment change.   Unfortunately, there is evidence of hepatic metastatic disease and potential celiac nodal disease on the current study.   Findings of more focal uptake in the RIGHT colon. This finding is  nonspecific but would consider correlation with recent colonoscopy if performed or with follow-up colonoscopy if not recently performed as warranted. Question of thickening though the colon is decompressed in this area.   Worsening of RIGHT maxillary sinus disease since previous imaging.   Cholelithiasis.   Aortic Atherosclerosis (ICD10-I70.0).   12/08/2020 Procedure   Ultrasound-guided biopsy of left liver mass as above.   12/14/2020 Pathology Results   - Metastatic carcinoma to liver, consistent with patient's clinical  history of primary head and neck squamous cell carcinoma   12/28/2020 - 12/28/2020 Chemotherapy   Patient is on Treatment Plan : HEAD/NECK Pembrolizumab + Carboplatin + 5FU q21d x 6 cycles / Pembrolizumab q21d     Liver metastases ([place])  12/17/2020 Initial Diagnosis   Liver metastases ([place])   12/28/2020 - 12/28/2020 Chemotherapy   Patient is on Treatment Plan : HEAD/NECK Pembrolizumab + Carboplatin + 5FU q21d x 6 cycles / Pembrolizumab q21d      Plan was to proceed with combination chemotherapy however he was non compliant with his appointments hence his plan was discontinued and he was sent back to me.  PHYSICAL EXAMINATION: ECOG PERFORMANCE STATUS: 2 - Symptomatic, <50% confined to bed  Vitals:   01/07/21 0932  BP: (!) 96/56  Pulse: (!) 52  Resp: 17  Temp: (!) 97 F (36.1 C)  SpO2: 99%    Filed Weights   01/07/21 0932  Weight: 128 lb 3 oz (58.1 kg)   GENERAL:alert, no distress and comfortable.  He looks thin, weak and frail SKIN: skin color, texture, turgor are normal, no rashes or significant lesions EYES: normal, Conjunctiva are pink and non-injected, sclera clear OROPHARYNX:no exudate, no erythema and lips, buccal mucosa, and tongue normal  NECK: supple, thyroid normal size, non-tender, without nodularity LYMPH:  no palpable lymphadenopathy in the cervical, axillary LUNGS: clear to auscultation and percussion with normal breathing  effort HEART: regular rate & rhythm and no murmurs and no lower extremity edema ABDOMEN: abdomen soft, non-tender and normal bowel sounds Musculoskeletal:no cyanosis of digits and no clubbing  NEURO: alert & oriented x 3 with fluent speech, no focal motor/sensory deficits  LABORATORY DATA:  I have reviewed the data as listed    Component Value Date/Time   NA 140 01/07/2021 0916   K 3.9 01/07/2021 0916   CL 101 01/07/2021 0916   CO2 34 (H) 01/07/2021 0916   GLUCOSE 99 01/07/2021 0916   BUN 21 01/07/2021 0916   CREATININE 1.30 (H) 01/07/2021 0916   CREATININE 1.28 (H) 11/27/2020 1249   CALCIUM 9.5 01/07/2021 0916   PROT 6.5 01/07/2021 0916   ALBUMIN 3.9 01/07/2021 0916   AST 11 (L) 01/07/2021 0916   AST 11 (L) 11/27/2020 1249   ALT 8 01/07/2021 0916   ALT 8 11/27/2020 1249   ALKPHOS 68  01/07/2021 0916   BILITOT 0.4 01/07/2021 0916   BILITOT 0.7 11/27/2020 1249   GFRNONAA 58 (L) 01/07/2021 0916   GFRNONAA 59 (L) 11/27/2020 1249    No results found for: SPEP, UPEP  Lab Results  Component Value Date   WBC 7.0 01/07/2021   NEUTROABS 5.4 01/07/2021   HGB 11.5 (L) 01/07/2021   HCT 35.1 (L) 01/07/2021   MCV 92.4 01/07/2021   PLT 162 01/07/2021      Chemistry      Component Value Date/Time   NA 140 01/07/2021 0916   K 3.9 01/07/2021 0916   CL 101 01/07/2021 0916   CO2 34 (H) 01/07/2021 0916   BUN 21 01/07/2021 0916   CREATININE 1.30 (H) 01/07/2021 0916   CREATININE 1.28 (H) 11/27/2020 1249      Component Value Date/Time   CALCIUM 9.5 01/07/2021 0916   ALKPHOS 68 01/07/2021 0916   AST 11 (L) 01/07/2021 0916   AST 11 (L) 11/27/2020 1249   ALT 8 01/07/2021 0916   ALT 8 11/27/2020 1249   BILITOT 0.4 01/07/2021 0916   BILITOT 0.7 11/27/2020 1249      I spent 40 minutes in the care of this patient including review history, medical records, scans, counseling about treatment, coordination of care.  Benay Drennen MD

## 2021-01-07 ENCOUNTER — Inpatient Hospital Stay (HOSPITAL_BASED_OUTPATIENT_CLINIC_OR_DEPARTMENT_OTHER): Payer: Medicare HMO | Admitting: Hematology and Oncology

## 2021-01-07 ENCOUNTER — Other Ambulatory Visit: Payer: Self-pay

## 2021-01-07 ENCOUNTER — Inpatient Hospital Stay: Payer: Medicare HMO

## 2021-01-07 ENCOUNTER — Encounter: Payer: Self-pay | Admitting: Hematology and Oncology

## 2021-01-07 ENCOUNTER — Encounter: Payer: Self-pay | Admitting: General Practice

## 2021-01-07 DIAGNOSIS — C787 Secondary malignant neoplasm of liver and intrahepatic bile duct: Secondary | ICD-10-CM

## 2021-01-07 DIAGNOSIS — Z7189 Other specified counseling: Secondary | ICD-10-CM | POA: Diagnosis not present

## 2021-01-07 DIAGNOSIS — R634 Abnormal weight loss: Secondary | ICD-10-CM

## 2021-01-07 DIAGNOSIS — K639 Disease of intestine, unspecified: Secondary | ICD-10-CM

## 2021-01-07 DIAGNOSIS — I700 Atherosclerosis of aorta: Secondary | ICD-10-CM | POA: Diagnosis not present

## 2021-01-07 DIAGNOSIS — C01 Malignant neoplasm of base of tongue: Secondary | ICD-10-CM

## 2021-01-07 DIAGNOSIS — Z79899 Other long term (current) drug therapy: Secondary | ICD-10-CM | POA: Diagnosis not present

## 2021-01-07 DIAGNOSIS — I517 Cardiomegaly: Secondary | ICD-10-CM | POA: Diagnosis not present

## 2021-01-07 DIAGNOSIS — K8020 Calculus of gallbladder without cholecystitis without obstruction: Secondary | ICD-10-CM | POA: Diagnosis not present

## 2021-01-07 DIAGNOSIS — I2510 Atherosclerotic heart disease of native coronary artery without angina pectoris: Secondary | ICD-10-CM | POA: Diagnosis not present

## 2021-01-07 DIAGNOSIS — K148 Other diseases of tongue: Secondary | ICD-10-CM

## 2021-01-07 LAB — CBC WITH DIFFERENTIAL (CANCER CENTER ONLY)
Abs Immature Granulocytes: 0.03 10*3/uL (ref 0.00–0.07)
Basophils Absolute: 0 10*3/uL (ref 0.0–0.1)
Basophils Relative: 1 %
Eosinophils Absolute: 0.2 10*3/uL (ref 0.0–0.5)
Eosinophils Relative: 2 %
HCT: 35.1 % — ABNORMAL LOW (ref 39.0–52.0)
Hemoglobin: 11.5 g/dL — ABNORMAL LOW (ref 13.0–17.0)
Immature Granulocytes: 0 %
Lymphocytes Relative: 8 %
Lymphs Abs: 0.6 10*3/uL — ABNORMAL LOW (ref 0.7–4.0)
MCH: 30.3 pg (ref 26.0–34.0)
MCHC: 32.8 g/dL (ref 30.0–36.0)
MCV: 92.4 fL (ref 80.0–100.0)
Monocytes Absolute: 0.8 10*3/uL (ref 0.1–1.0)
Monocytes Relative: 11 %
Neutro Abs: 5.4 10*3/uL (ref 1.7–7.7)
Neutrophils Relative %: 78 %
Platelet Count: 162 10*3/uL (ref 150–400)
RBC: 3.8 MIL/uL — ABNORMAL LOW (ref 4.22–5.81)
RDW: 13 % (ref 11.5–15.5)
WBC Count: 7 10*3/uL (ref 4.0–10.5)
nRBC: 0 % (ref 0.0–0.2)

## 2021-01-07 LAB — COMPREHENSIVE METABOLIC PANEL
ALT: 8 U/L (ref 0–44)
AST: 11 U/L — ABNORMAL LOW (ref 15–41)
Albumin: 3.9 g/dL (ref 3.5–5.0)
Alkaline Phosphatase: 68 U/L (ref 38–126)
Anion gap: 5 (ref 5–15)
BUN: 21 mg/dL (ref 8–23)
CO2: 34 mmol/L — ABNORMAL HIGH (ref 22–32)
Calcium: 9.5 mg/dL (ref 8.9–10.3)
Chloride: 101 mmol/L (ref 98–111)
Creatinine, Ser: 1.3 mg/dL — ABNORMAL HIGH (ref 0.61–1.24)
GFR, Estimated: 58 mL/min — ABNORMAL LOW (ref >60)
Glucose, Bld: 99 mg/dL (ref 70–99)
Potassium: 3.9 mmol/L (ref 3.5–5.1)
Sodium: 140 mmol/L (ref 135–145)
Total Bilirubin: 0.4 mg/dL (ref 0.3–1.2)
Total Protein: 6.5 g/dL (ref 6.5–8.1)

## 2021-01-07 NOTE — Progress Notes (Signed)
[facility] CSW Progress Notes  Call to patient to explore his understanding of plan moving forward and awareness of upcoming appointments/arrangements.  He was not aware of 1/4 appointment, but was able to read it back to CSW and "I will put it on my 2023 calendar."  He wanted someone else remind him of appointment.  He can drive to appointments if needed.  Got his permission to speak w contact Dianah Field - she states "I had not heard from him in 60 - 10 years until he called me to ask me to come with him to the [facility] the last time."  She is aware that he does not remember appointments, "he is just a rare bird, he is just different."  He was "like this even 10 years ago."  She has a 87 year old mother that she is taking care of so she is not always available to transport.  If he does not need transport, she is willing to make reminder calls to him.    He has no family, mother died many years ago, father and stepmother died approx 8 years ago.  He has no children or siblings.  Any extended family (cousins) he has are much older and live far away.  [name] is his only local support and her availability is limited. She will "bug him" about his appointments - if he is willing, she would be able to monitor his MyChart account for time/date of upcoming appointments.  One of patient's main concerns is care for his dog Ivory Broad (a Christie Nottingham) - "I am really worried about what will happen to her if something happens to me.  My vet is [facility] on [address], could someone let them know?"  CSW spoke w [name], she is willing to take care of the pet in the event of patient's inability to do so.    [name] voiced some concern about his mentation - "I wonder if something is happening to his mind."  Per friend, "he has many doctors and sees his primary care doctor quite often."    CSW recommended oncologist could reach out to PCP of record to determine whether PCP has noted any cognitive issues  and/or make referral to [facility] for additional layer of support and care coordination.  Edwyna Shell, LCSW Clinical Social Worker Phone:  [phone]

## 2021-01-07 NOTE — Progress Notes (Signed)
START OFF PATHWAY REGIMEN - Other   [id number]:Pembrolizumab 200 mg IV D1 q21 Days:   A cycle is every 21 days:     Pembrolizumab   **Always confirm dose/schedule in your pharmacy ordering system**  Patient Characteristics: Intent of Therapy: Non-Curative / Palliative Intent, Discussed with Patient 

## 2021-01-07 NOTE — Assessment & Plan Note (Addendum)
I have clearly explained that he has advanced squamous cell carcinoma now metastatic to the liver based on biopsy results.  From his last discussion with Dr. Girtha Rm surgery, he was interested in proceeding with aggressive treatment.  Initially plan was to try combination chemoimmunotherapy however he no-show to his port appointments multiple times since this was discontinued.  I explained to him that the intent of treatment is palliative.  We once again discussed adverse effects of chemotherapy including but not limited to fatigue, nausea, vomiting, diarrhea, increased risk of infections, neuropathy etc. Dr. Alvy Bimler has previously discussed about adverse effects of chemoimmunotherapy in detail as well. I recommend starting weekly CarboTaxol and Keytruda every 21 days given his borderline performance status and questionable tolerance.  He understands that if he no-shows to his appointments, we will discontinue chemotherapy and immunotherapy and we will recommend palliative care and comfort measures. He again agrees to show up to his appointments.  He will get his port done and return to clinic for follow-up with me in 2 weeks.  I have not requested scheduling chemotherapy at this time since patient has no showed multiple times in the past.  I will get this taken care of once the port is placed.

## 2021-01-07 NOTE — Progress Notes (Signed)
DISCONTINUE ON PATHWAY REGIMEN - Head and Neck     A cycle is every 21 days:     Carboplatin      Fluorouracil      Pembrolizumab   **Always confirm dose/schedule in your pharmacy ordering system**  REASON: Other Reason PRIOR TREATMENT: [id number]: Pembrolizumab 200 mg D1 + Carboplatin AUC=5 D1 + Fluorouracil 1,000 mg/m2/day CIV D1-4 q21 Days x 6 Cycles, then Pembrolizumab 200 mg q21 Days for up to a total of 24 Months TREATMENT RESPONSE: Unable to Evaluate  START OFF PATHWAY REGIMEN - Head and Neck   Custom Intervention:Medical: [Paclitaxel, Carboplatin]:     Paclitaxel      Carboplatin   **Always confirm dose/schedule in your pharmacy ordering system**  Patient Characteristics: Oropharynx, HPV Positive, Metastatic, First Line, No Prior Platinum-Based Chemoradiation within 6 Months, PD-L1 Expression Negative (CPS < 1) / Unknown Disease Classification: Oropharynx HPV Status: Positive (+) Therapeutic Status: Metastatic Disease Line of Therapy: First Line Prior Platinum Status: No Prior Platinum-Based Chemoradiation within 6 Months PD-L1 Expression Status: Awaiting Test Results Intent of Therapy: Non-Curative / Palliative Intent, Discussed with Patient

## 2021-01-07 NOTE — Assessment & Plan Note (Signed)
Ongoing weight loss, likely because of reduced appetite.   I encouraged him to try energy drinks and will also engage him with nutrition again for follow-up.

## 2021-01-07 NOTE — Assessment & Plan Note (Signed)
I have clearly discussed the advanced nature of the disease and the known curative intent of treatment.  I have discussed that if he continues to no-show or remain noncompliant with recommendations, we may have to proceed with palliative care and comfort measures.  He expressed understanding.

## 2021-01-07 NOTE — Progress Notes (Signed)
Oncology Nurse Navigator Documentation   I met with Jerry Cook during his scheduled appointment with Dr. Chryl Heck today. He was informed of the possible treatment and recommendations for his future care. He is aware that he will need a PAC to receive chemotherapy. I've explained that he will need a driver and that I will contact him with that new appointment once scheduled. He has also given me permission to call his emergency contact, Denise with the appointment as well. He will see Dr. Chryl Heck again on 1/4 and proceed with treatment if [facility] is in place. He verbalized understanding of the information provided and knows to call me if he needs anything.  [name] RN, BSN, OCN Head & Neck Oncology Nurse [place] at [facility] Phone # [phone]  Fax # [phone]

## 2021-01-12 ENCOUNTER — Telehealth: Payer: Self-pay | Admitting: Hematology and Oncology

## 2021-01-12 NOTE — Telephone Encounter (Signed)
Scheduled per sch msg. Called and left msg  

## 2021-01-13 NOTE — Progress Notes (Signed)
Oncology Nurse Navigator Documentation   I have called and left a voice mail with Mr. Amsden and also his friend [name] with the appointment date/time of his PAC procedure next week on 01/19/21. I have made them both aware that he needs to arrive at 12:00 pm at [facility] main entrance. He needs to have nothing to eat or drink after midnight the night before and he needs a responsible driver. I have left my direct contact information with each of them to call me back with any questions.   [name] RN, BSN, OCN Head & Neck Oncology Nurse [place] at [facility] Phone # [phone]  Fax # [phone]

## 2021-01-15 ENCOUNTER — Other Ambulatory Visit: Payer: Self-pay | Admitting: Radiology

## 2021-01-17 ENCOUNTER — Encounter: Payer: Self-pay | Admitting: Hematology and Oncology

## 2021-01-19 ENCOUNTER — Ambulatory Visit (HOSPITAL_COMMUNITY)
Admission: RE | Admit: 2021-01-19 | Discharge: 2021-01-19 | Disposition: A | Discharge status: Home / Self Care | Payer: Medicare PPO | Source: Ambulatory Visit | Attending: Hematology and Oncology | Admitting: Hematology and Oncology

## 2021-01-19 ENCOUNTER — Encounter (HOSPITAL_COMMUNITY): Payer: Self-pay

## 2021-01-19 ENCOUNTER — Other Ambulatory Visit: Payer: Self-pay | Admitting: Radiology

## 2021-01-19 ENCOUNTER — Other Ambulatory Visit: Payer: Self-pay

## 2021-01-19 DIAGNOSIS — Z681 Body mass index (BMI) 19 or less, adult: Secondary | ICD-10-CM | POA: Insufficient documentation

## 2021-01-19 DIAGNOSIS — C01 Malignant neoplasm of base of tongue: Secondary | ICD-10-CM | POA: Diagnosis not present

## 2021-01-19 DIAGNOSIS — C760 Malignant neoplasm of head, face and neck: Secondary | ICD-10-CM | POA: Diagnosis not present

## 2021-01-19 DIAGNOSIS — R634 Abnormal weight loss: Secondary | ICD-10-CM | POA: Diagnosis not present

## 2021-01-19 DIAGNOSIS — C787 Secondary malignant neoplasm of liver and intrahepatic bile duct: Secondary | ICD-10-CM | POA: Diagnosis not present

## 2021-01-19 DIAGNOSIS — Z452 Encounter for adjustment and management of vascular access device: Secondary | ICD-10-CM | POA: Diagnosis not present

## 2021-01-19 HISTORY — PX: IR IMAGING GUIDED PORT INSERTION: IMG5740

## 2021-01-19 MED ORDER — FENTANYL CITRATE (PF) 100 MCG/2ML IJ SOLN
INTRAMUSCULAR | Status: AC
  Filled 2021-01-19: qty 2

## 2021-01-19 MED ORDER — FENTANYL CITRATE (PF) 100 MCG/2ML IJ SOLN
INTRAMUSCULAR | Status: AC | PRN
Start: 2021-01-19 — End: 2021-01-19
  Administered 2021-01-19 (×2): 50 ug via INTRAVENOUS

## 2021-01-19 MED ORDER — MIDAZOLAM HCL 2 MG/2ML IJ SOLN
INTRAMUSCULAR | Status: AC
  Filled 2021-01-19: qty 4

## 2021-01-19 MED ORDER — MIDAZOLAM HCL 2 MG/2ML IJ SOLN
INTRAMUSCULAR | Status: AC | PRN
Start: 2021-01-19 — End: 2021-01-19
  Administered 2021-01-19 (×2): 1 mg via INTRAVENOUS

## 2021-01-19 MED ORDER — LIDOCAINE-EPINEPHRINE 1 %-1:100000 IJ SOLN
INTRAMUSCULAR | Status: AC
  Filled 2021-01-19: qty 1

## 2021-01-19 MED ORDER — HEPARIN SOD (PORK) LOCK FLUSH 100 UNIT/ML IV SOLN
INTRAVENOUS | Status: AC
  Filled 2021-01-19: qty 5

## 2021-01-19 MED ORDER — SODIUM CHLORIDE 0.9 % IV SOLN: INTRAVENOUS | Status: DC

## 2021-01-19 NOTE — Procedures (Signed)
Interventional Radiology Procedure Note  Procedure: Single Lumen Power Port Placement    Access:  Right IJ vein.  Findings: Catheter tip positioned at SVC/RA junction. Port is ready for immediate use.   Complications: None  EBL: < 10 mL  Recommendations:  - Ok to shower in 24 hours - Do not submerge for 7 days - Routine line care   Zoeann Mol T. Kellina Dreese, M.D Pager:  [phone]   

## 2021-01-19 NOTE — H&P (Signed)
Chief Complaint: Patient was seen in consultation today for [facility] a cath placement at the request of [place]  Referring Physician(s): Heath Lark  Supervising Physician: Aletta Edouard  Patient Status: [facility] - Out-pt  History of Present Illness: Jerry Cook is a 71 y.o. 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 Wt loss Follows with Dr Chryl Heck To start chemo therapy tomorrow Scheduled today for [facility] placement   Past Medical History:  Diagnosis Date   GERD (gastroesophageal reflux disease)    Hyperlipidemia    Hypertension     Past Surgical History:  Procedure Laterality Date   EYE SURGERY Bilateral    cataract   LARYNGOSCOPY AND BRONCHOSCOPY N/A 03/13/2020   Procedure: Direct LARYNGOSCOPY WITH BX  AND ESPHOGOSCOPY;  Surgeon: Melida Quitter, MD;  Location: [place];  Service: ENT;  Laterality: N/A;   TONSILLECTOMY      Allergies: Patient has no known allergies.  Medications: Prior to Admission medications   Medication Sig Start Date End Date Taking? Authorizing Provider  atorvastatin (LIPITOR) 10 MG tablet Take 10 mg by mouth at bedtime. 01/23/18  Yes [provider]  bifidobacterium infantis (ALIGN) capsule Take 1 capsule by mouth daily.   Yes [provider]  cetirizine (ZYRTEC) 10 MG tablet Take 10 mg by mouth daily.   Yes [provider]  clonazePAM (KLONOPIN) 2 MG tablet Take 2 mg by mouth at bedtime. 01/26/18  Yes [provider]  finasteride (PROSCAR) 5 MG tablet Take 5 mg by mouth daily.   Yes [provider]  fluticasone (FLONASE) 50 MCG/ACT nasal spray Place 2 sprays into both nostrils as needed for allergies.   Yes [provider]  metoprolol tartrate (LOPRESSOR) 50 MG tablet Take 50 mg by mouth 2 (two) times daily. 03/05/18  Yes [provider]  omeprazole (PRILOSEC) 20 MG capsule Take 20 mg by mouth daily. 03/05/18  Yes [provider]  PARoxetine (PAXIL) 20 MG tablet Take 20 mg by mouth daily.    Yes [provider]  tamsulosin (FLOMAX) 0.4 MG CAPS capsule Take 0.4 mg by mouth at bedtime.   Yes [provider]  traZODone (DESYREL) 100 MG tablet Take 100 mg by mouth at bedtime. 03/07/18  Yes [provider]  prochlorperazine (COMPAZINE) 10 MG tablet Take 1 tablet (10 mg total) by mouth every 6 (six) hours as needed (Nausea or vomiting). 12/17/20 12/30/20  Heath Lark, MD     History reviewed. No pertinent family history.  Social History   Socioeconomic History   Marital status: Single    Spouse name: Not on file   Number of children: Not on file   Years of education: Not on file   Highest education level: Not on file  Occupational History   Not on file  Tobacco Use   Smoking status: Former    Years: 13.00    Types: Cigarettes    Quit date: 03/24/1979    Years since quitting: 41.8   Smokeless tobacco: Never   Tobacco comments:    Quit in 1981  Vaping Use   Vaping Use: Never used  Substance and Sexual Activity   Alcohol use: Not Currently    Alcohol/week: 1.0 standard drink    Types: 1 Cans of beer per week    Comment: Weekly   Drug use: Never   Sexual activity: Not Currently  Other Topics Concern   Not on file  Social History Narrative   Not on file   Social Determinants of  Health   Financial Resource Strain: Not on file  Food Insecurity: No Food Insecurity   Worried About [place] in the Last Year: Never true   Ran Out of Food in the Last Year: Never true  Transportation Needs: No Transportation Needs   Lack of Transportation (Medical): No   Lack of Transportation (Non-Medical): No  Physical Activity: Inactive   Days of Exercise per Week: 0 days   Minutes of Exercise per Session: 0 min  Stress: Not on file  Social Connections: Socially Isolated   Frequency of Communication with Friends and Family: Never   Frequency of Social Gatherings with Friends and Family: Never   Attends Religious Services: Never   Museum/gallery conservator or Organizations: No   Attends Archivist Meetings: Never   Marital Status: Never married     Review of Systems: A 12 point ROS discussed and pertinent positives are indicated in the HPI above.  All other systems are negative.  Review of Systems  Constitutional:  Positive for activity change, appetite change and unexpected weight change. Negative for fatigue and fever.  Respiratory:  Negative for cough and shortness of breath.   Cardiovascular:  Negative for chest pain.  Gastrointestinal:  Negative for abdominal pain.  Musculoskeletal:  Negative for gait problem.  Psychiatric/Behavioral:  Negative for behavioral problems and confusion.    Vital Signs: BP 123/63 (BP Location: Right Arm)    Pulse (!) 55    Temp 97.9 F (36.6 C) (Oral)    Ht 5\' 10"  (1.778 m)    Wt 130 lb (59 kg)    SpO2 100%    BMI 18.65 kg/m   Physical Exam Vitals reviewed.  HENT:     Mouth/Throat:     Mouth: Mucous membranes are moist.  Cardiovascular:     Rate and Rhythm: Normal rate and regular rhythm.     Heart sounds: Normal heart sounds.  Pulmonary:     Effort: Pulmonary effort is normal.     Breath sounds: Normal breath sounds.  Abdominal:     Palpations: Abdomen is soft.  Musculoskeletal:        General: Normal range of motion.  Skin:    General: Skin is warm.  Neurological:     Mental Status: He is alert and oriented to person, place, and time.  Psychiatric:        Behavior: Behavior normal.    Imaging: No results found.  Labs:  CBC: Recent Labs    06/08/20 1409 11/27/20 1249 12/09/20 1155 01/07/21 0916  WBC 6.3 6.9 6.5 7.0  HGB 11.9* 11.9* 11.2* 11.5*  HCT 35.4* 35.5* 33.9* 35.1*  PLT 184 194 199 162    COAGS: Recent Labs    03/03/20 1513 11/27/20 1249 12/09/20 1155  INR 1.2 1.1 1.1  APTT  --  30  --     BMP: Recent Labs    06/08/20 1409 11/27/20 1249 12/09/20 1155 01/07/21 0916  NA 139 139 138 140  K 3.2* 4.0 4.1 3.9  CL 96* 101 100 101  CO2  36* 29 32 34*  GLUCOSE 97 91 85 99  BUN 13 18 19 21   CALCIUM 9.2 9.2 9.0 9.5  CREATININE 1.38* 1.28* 1.01 1.30*  GFRNONAA 55* 59* >60 58*    LIVER FUNCTION TESTS: Recent Labs    03/25/20 1238 11/27/20 1249 12/09/20 1155 01/07/21 0916  BILITOT 0.7 0.7 0.6 0.4  AST 20 11* 13* 11*  ALT 18 8 11  8  ALKPHOS 75 59 53 68  PROT 7.1 6.5 6.4* 6.5  ALBUMIN 3.9 3.6 3.6 3.9    TUMOR MARKERS: No results for input(s): AFPTM, CEA, CA199, CHROMGRNA in the last 8760 hours.  Assessment and Plan:  Head/neck cancer Mets to liver To start chemotherapy tomorrow For [facility] placement today in IR Risks and benefits of image guided port-a-catheter placement was discussed with the patient including, but not limited to bleeding, infection, pneumothorax, or fibrin sheath development and need for additional procedures.  All of the patient's questions were answered, patient is agreeable to proceed. Consent signed and in chart.   Thank you for this interesting consult.  I greatly enjoyed meeting Jerry Cook and look forward to participating in their care.  A copy of this report was sent to the requesting provider on this date.  Electronically Signed: Lavonia Drafts, PA-C 01/19/2021, 1:28 PM   I spent a total of  30 Minutes   in face to face in clinical consultation, greater than 50% of which was counseling/coordinating care for [facility] a cath placement

## 2021-01-20 ENCOUNTER — Other Ambulatory Visit: Payer: Self-pay

## 2021-01-20 ENCOUNTER — Inpatient Hospital Stay: Payer: Medicare PPO | Attending: Hematology and Oncology | Admitting: Hematology and Oncology

## 2021-01-20 ENCOUNTER — Encounter: Payer: Self-pay | Admitting: Hematology and Oncology

## 2021-01-20 VITALS — BP 102/65 | HR 63 | Temp 97.6°F | Resp 16 | Ht 70.0 in | Wt 124.7 lb

## 2021-01-20 DIAGNOSIS — R221 Localized swelling, mass and lump, neck: Secondary | ICD-10-CM | POA: Diagnosis not present

## 2021-01-20 DIAGNOSIS — C787 Secondary malignant neoplasm of liver and intrahepatic bile duct: Secondary | ICD-10-CM | POA: Insufficient documentation

## 2021-01-20 DIAGNOSIS — C01 Malignant neoplasm of base of tongue: Secondary | ICD-10-CM | POA: Diagnosis not present

## 2021-01-20 DIAGNOSIS — Z7189 Other specified counseling: Secondary | ICD-10-CM

## 2021-01-20 DIAGNOSIS — Z79899 Other long term (current) drug therapy: Secondary | ICD-10-CM | POA: Insufficient documentation

## 2021-01-20 DIAGNOSIS — I700 Atherosclerosis of aorta: Secondary | ICD-10-CM | POA: Insufficient documentation

## 2021-01-20 DIAGNOSIS — R634 Abnormal weight loss: Secondary | ICD-10-CM | POA: Diagnosis not present

## 2021-01-20 DIAGNOSIS — Z7952 Long term (current) use of systemic steroids: Secondary | ICD-10-CM | POA: Diagnosis not present

## 2021-01-20 DIAGNOSIS — I517 Cardiomegaly: Secondary | ICD-10-CM | POA: Diagnosis not present

## 2021-01-20 DIAGNOSIS — I2510 Atherosclerotic heart disease of native coronary artery without angina pectoris: Secondary | ICD-10-CM | POA: Insufficient documentation

## 2021-01-20 DIAGNOSIS — K8020 Calculus of gallbladder without cholecystitis without obstruction: Secondary | ICD-10-CM | POA: Insufficient documentation

## 2021-01-20 MED ORDER — DEXAMETHASONE 4 MG PO TABS: 8.0000 mg | ORAL_TABLET | Freq: Every day | ORAL | 1 refills | Status: DC

## 2021-01-20 MED ORDER — PROCHLORPERAZINE MALEATE 10 MG PO TABS: 10.0000 mg | ORAL_TABLET | Freq: Four times a day (QID) | ORAL | 1 refills | Status: DC | PRN

## 2021-01-20 MED ORDER — LIDOCAINE-PRILOCAINE 2.5-2.5 % EX CREA: TOPICAL_CREAM | CUTANEOUS | 3 refills | Status: DC

## 2021-01-20 MED ORDER — ONDANSETRON HCL 8 MG PO TABS: 8.0000 mg | ORAL_TABLET | Freq: Two times a day (BID) | ORAL | 1 refills | Status: DC | PRN

## 2021-01-20 NOTE — Progress Notes (Signed)
[place] OFFICE PROGRESS NOTE  Patient Care Team: Shirline Frees, MD as PCP - General (Family Medicine) Eppie Gibson, MD as Consulting Physician (Radiation Oncology) Malmfelt, Stephani Police, RN as Oncology Nurse Navigator  ASSESSMENT & PLAN:  No problem-specific Assessment & Plan notes found for this encounter.   No orders of the defined types were placed in this encounter.   All questions were answered. The patient knows to call the clinic with any problems, questions or concerns.   Benay Pardee, MD 01/20/2021 4:07 PM  INTERVAL HISTORY:  Mr Mccravy is here for a follow up by himself. Our NN Anderson Malta joined me for this appointment. When he saw Dr. Simeon Craft such, he was interested in proceeding with chemotherapy hence he was recommended to get a port placed however he no showed to his appointments.  He now tells me that he is interested in proceeding with treatments and he will try his best to make it to the appointments.  He denies any pain today.  He feels a bit more tired and may have lost some weight over the past few months but he could not quantitate it.  No difficulty swallowing or change in breathing.  No change in bowel habits. He lives by himself, has a dog. No extensive social support, has a friend who can help bring him on days he need help Rest of the pertinent 10 point ROS reviewed and negative  I have reviewed the past medical history, past surgical history, social history and family history with the patient and they are unchanged from previous note.  ALLERGIES:  has No Known Allergies.  MEDICATIONS:  Current Outpatient Medications  Medication Sig Dispense Refill   atorvastatin (LIPITOR) 10 MG tablet Take 10 mg by mouth at bedtime.     bifidobacterium infantis (ALIGN) capsule Take 1 capsule by mouth daily.     cetirizine (ZYRTEC) 10 MG tablet Take 10 mg by mouth daily.     clonazePAM (KLONOPIN) 2 MG tablet Take 2 mg by mouth at bedtime.     dexamethasone  (DECADRON) 4 MG tablet Take 2 tablets (8 mg total) by mouth daily. Start the day after chemotherapy for 2 days. 30 tablet 1   finasteride (PROSCAR) 5 MG tablet Take 5 mg by mouth daily.     fluticasone (FLONASE) 50 MCG/ACT nasal spray Place 2 sprays into both nostrils as needed for allergies.     lidocaine-prilocaine (EMLA) cream Apply to affected area once 30 g 3   metoprolol tartrate (LOPRESSOR) 50 MG tablet Take 50 mg by mouth 2 (two) times daily.     omeprazole (PRILOSEC) 20 MG capsule Take 20 mg by mouth daily.     ondansetron (ZOFRAN) 8 MG tablet Take 1 tablet (8 mg total) by mouth 2 (two) times daily as needed for refractory nausea / vomiting. Start on day 3 after chemo. 30 tablet 1   PARoxetine (PAXIL) 20 MG tablet Take 20 mg by mouth daily.     prochlorperazine (COMPAZINE) 10 MG tablet Take 1 tablet (10 mg total) by mouth every 6 (six) hours as needed (Nausea or vomiting). 30 tablet 1   tamsulosin (FLOMAX) 0.4 MG CAPS capsule Take 0.4 mg by mouth at bedtime.     traZODone (DESYREL) 100 MG tablet Take 100 mg by mouth at bedtime.     No current facility-administered medications for this visit.    SUMMARY OF ONCOLOGIC HISTORY: Oncology History Overview Note  HPV positive   Squamous cell carcinoma of base  of tongue ([place])  03/03/2020 Imaging   CT neck  2.5 x 1.9 x 3.0 cm right base of tongue and glossotonsillar sulcus mass extending to the region of the right vallecula, highly suspicious for a squamous cell carcinoma primary tumor. ENT consultation and direct visualization recommended.   Extensive bilateral cystic/necrotic nodal metastatic disease at the right level 1, 2, 3 and 4 stations and left level 2 station. Several lymph nodes demonstrate ill-defined margins highly suspicious for extracapsular extension.   Lymphadenopathy significantly narrows the upper internal jugular veins bilaterally.   Paranasal sinus disease as described.   03/13/2020 Pathology Results   SURGICAL  PATHOLOGY   FINAL MICROSCOPIC DIAGNOSIS:   A. TONGUE, RIGHT BASE MASS, BIOPSY:  - Squamous cell carcinoma.  - See comment.   COMMENT:  Immunohistochemistry will be performed and reported as an addendum.   ADDENDUM: Immunohistochemistry for p16 is strongly and diffusely positive.    03/25/2020 Initial Diagnosis   Squamous cell carcinoma of base of tongue ([place])   03/25/2020 Cancer Staging   Staging form: Pharynx - HPV-Mediated Oropharynx, AJCC 8th Edition - Clinical stage from 03/25/2020: Stage IV (rcT3, cN2, pM1, p16+) - Signed by Heath Lark, MD on 12/17/2020 Stage prefix: Recurrence    04/10/2020 PET scan   1. Hypermetabolic mass of the right tongue base and palatine tonsillar region, with extensive hypermetabolic right neck adenopathy and some hypermetabolic left neck adenopathy as well as a small but hypermetabolic right supraclavicular lymph node as detailed above. 2. Questionable focus of accentuated activity along the dome of the liver, probably incidental. If the patient has abnormal liver enzymes or if otherwise clinically warranted, hepatic protocol MRI with and without contrast could be utilized to assess for an actual underlying lesion in this region. 3. Accentuated anorectal activity. This is most commonly physiologic, but correlate with patient's colon screening history in assessing whether further workup is indicated. 4. Other imaging findings of potential clinical significance: Chronic right maxillary sinusitis. Aortic Atherosclerosis (ICD10-I70.0). Coronary atherosclerosis with mild cardiomegaly. Prostatomegaly. Sludge versus small gallstones in the gallbladder.     04/27/2020 - 07/03/2020 Radiation Therapy   Radiation treatment dates:   04/27/2020 to 07/03/2020   Site/dose:   The patient received intensity modulated radiation therapy to the oropharynx and bilateral neck.   Beams/energy:   IMRT, 6 MV photons   Narrative: The patient tolerated radiation treatment relatively  well.   However, he missed multiple treatments despite intensive navigation by our care team and ultimately elected to stop radiation early [place].  He received 60 Gray in 30 fractions rather than the prescribed total dose of 70 Gray in 35 fractions   11/04/2020 PET scan   Marked interval response to therapy still with some increased metabolic activity associated with the mildly enlarged RIGHT neck lymph node.    Muscular activity elsewhere in the neck and sequela of post treatment change.   Unfortunately, there is evidence of hepatic metastatic disease and potential celiac nodal disease on the current study.   Findings of more focal uptake in the RIGHT colon. This finding is nonspecific but would consider correlation with recent colonoscopy if performed or with follow-up colonoscopy if not recently performed as warranted. Question of thickening though the colon is decompressed in this area.   Worsening of RIGHT maxillary sinus disease since previous imaging.   Cholelithiasis.   Aortic Atherosclerosis (ICD10-I70.0).   12/08/2020 Procedure   Ultrasound-guided biopsy of left liver mass as above.   12/14/2020 Pathology Results   -  Metastatic carcinoma to liver, consistent with patient's clinical  history of primary head and neck squamous cell carcinoma   12/28/2020 - 12/28/2020 Chemotherapy   Patient is on Treatment Plan : HEAD/NECK Pembrolizumab + Carboplatin + 5FU q21d x 6 cycles / Pembrolizumab q21d     01/21/2021 -  Chemotherapy   Patient is on Treatment Plan : LUNG Carboplatin / Paclitaxel q7d     01/21/2021 -  Chemotherapy   Patient is on Treatment Plan : HEAD/NECK Pembrolizumab (200) q21d     Liver metastases ([place])  12/17/2020 Initial Diagnosis   Liver metastases ([place])   12/28/2020 - 12/28/2020 Chemotherapy   Patient is on Treatment Plan : HEAD/NECK Pembrolizumab + Carboplatin + 5FU q21d x 6 cycles / Pembrolizumab q21d     01/21/2021 -  Chemotherapy   Patient is  on Treatment Plan : LUNG Carboplatin / Paclitaxel q7d     01/21/2021 -  Chemotherapy   Patient is on Treatment Plan : HEAD/NECK Pembrolizumab (200) q21d      Plan was to proceed with combination chemotherapy however he was non compliant with his appointments hence his plan was discontinued and he was sent back to me.  PHYSICAL EXAMINATION: ECOG PERFORMANCE STATUS: 2 - Symptomatic, <50% confined to bed  Vitals:   01/20/21 1554  BP: 102/65  Pulse: 63  Resp: 16  Temp: 97.6 F (36.4 C)  SpO2: 99%    Filed Weights   01/20/21 1554  Weight: 124 lb 11.2 oz (56.6 kg)   GENERAL:alert, no distress and comfortable.  He looks thin, weak and frail SKIN: skin color, texture, turgor are normal, no rashes or significant lesions EYES: normal, Conjunctiva are pink and non-injected, sclera clear OROPHARYNX:no exudate, no erythema and lips, buccal mucosa, and tongue normal  NECK: supple, thyroid normal size, non-tender, without nodularity LYMPH:  no palpable lymphadenopathy in the cervical, axillary LUNGS: clear to auscultation and percussion with normal breathing effort HEART: regular rate & rhythm and no murmurs and no lower extremity edema ABDOMEN: abdomen soft, non-tender and normal bowel sounds Musculoskeletal:no cyanosis of digits and no clubbing  NEURO: alert & oriented x 3 with fluent speech, no focal motor/sensory deficits  LABORATORY DATA:  I have reviewed the data as listed    Component Value Date/Time   NA 140 01/07/2021 0916   K 3.9 01/07/2021 0916   CL 101 01/07/2021 0916   CO2 34 (H) 01/07/2021 0916   GLUCOSE 99 01/07/2021 0916   BUN 21 01/07/2021 0916   CREATININE 1.30 (H) 01/07/2021 0916   CREATININE 1.28 (H) 11/27/2020 1249   CALCIUM 9.5 01/07/2021 0916   PROT 6.5 01/07/2021 0916   ALBUMIN 3.9 01/07/2021 0916   AST 11 (L) 01/07/2021 0916   AST 11 (L) 11/27/2020 1249   ALT 8 01/07/2021 0916   ALT 8 11/27/2020 1249   ALKPHOS 68 01/07/2021 0916   BILITOT 0.4  01/07/2021 0916   BILITOT 0.7 11/27/2020 1249   GFRNONAA 58 (L) 01/07/2021 0916   GFRNONAA 59 (L) 11/27/2020 1249    No results found for: SPEP, UPEP  Lab Results  Component Value Date   WBC 7.0 01/07/2021   NEUTROABS 5.4 01/07/2021   HGB 11.5 (L) 01/07/2021   HCT 35.1 (L) 01/07/2021   MCV 92.4 01/07/2021   PLT 162 01/07/2021      Chemistry      Component Value Date/Time   NA 140 01/07/2021 0916   K 3.9 01/07/2021 0916   CL 101 01/07/2021 0916  CO2 34 (H) 01/07/2021 0916   BUN 21 01/07/2021 0916   CREATININE 1.30 (H) 01/07/2021 0916   CREATININE 1.28 (H) 11/27/2020 1249      Component Value Date/Time   CALCIUM 9.5 01/07/2021 0916   ALKPHOS 68 01/07/2021 0916   AST 11 (L) 01/07/2021 0916   AST 11 (L) 11/27/2020 1249   ALT 8 01/07/2021 0916   ALT 8 11/27/2020 1249   BILITOT 0.4 01/07/2021 0916   BILITOT 0.7 11/27/2020 1249      I spent 40 minutes in the care of this patient including review history, medical records, scans, counseling about treatment, coordination of care.  Benay Wiest MD

## 2021-01-20 NOTE — Assessment & Plan Note (Signed)
I have clearly explained that he has advanced squamous cell carcinoma now metastatic to the liver based on biopsy results.   Initially plan was to try combination chemoimmunotherapy however he did not show up to multiple appointments hence the plan for chemotherapy was canceled.  When he showed up to our last appointment, once again discussed the intent of treatment, adverse effects of chemoimmunotherapy and arrange for port placement.  He is here for follow-up after his port placement. We once again discussed adverse effects of chemotherapy including but not limited to fatigue, nausea, vomiting, diarrhea, increased risk of infections, neuropathy.  I have also discussed about mechanism of action of immunotherapy, adverse effects of immunotherapy including but not limited to fatigue, dry skin, hypo-/hyperthyroidism, pneumonitis, hepatitis, nephritis etc.  I have discussed that immunotherapy can cause fatal adverse effects and 2% of the patients and can virtually affect any organ because of over activated immune system.  He once again told me he cannot remember most of the stuff hence I have given printed information for CarboTaxol/Keytruda today and asked the patient to review this in detail and contact us with any questions.  Will arrange for his chemotherapy appointment in the next few days he will follow-up with me before chemotherapy and every 3 weeks after.  We will plan to reimage him after 2 cycles.[name]

## 2021-01-20 NOTE — Progress Notes (Signed)
[place] OFFICE PROGRESS NOTE  Patient Care Team: Shirline Frees, MD as PCP - General (Family Medicine) Eppie Gibson, MD as Consulting Physician (Radiation Oncology) Malmfelt, Stephani Police, RN as Oncology Nurse Navigator  ASSESSMENT & PLAN:  Squamous cell carcinoma of base of tongue ([place]) I have clearly explained that he has advanced squamous cell carcinoma now metastatic to the liver based on biopsy results.   Initially plan was to try combination chemoimmunotherapy however he did not show up to multiple appointments hence the plan for chemotherapy was canceled.  When he showed up to our last appointment, once again discussed the intent of treatment, adverse effects of chemoimmunotherapy and arrange for port placement.  He is here for follow-up after his port placement. We once again discussed adverse effects of chemotherapy including but not limited to fatigue, nausea, vomiting, diarrhea, increased risk of infections, neuropathy.  I have also discussed about mechanism of action of immunotherapy, adverse effects of immunotherapy including but not limited to fatigue, dry skin, hypo-/hyperthyroidism, pneumonitis, hepatitis, nephritis etc.  I have discussed that immunotherapy can cause fatal adverse effects and 2% of the patients and can virtually affect any organ because of over activated immune system.  He once again told me he cannot remember most of the stuff hence I have given printed information for CarboTaxol/Keytruda today and asked the patient to review this in detail and contact us with any questions.  Will arrange for his chemotherapy appointment in the next few days he will follow-up with me before chemotherapy and every 3 weeks after.  We will plan to reimage him after 2 cycles..   Weight loss, unintentional He lost about 6 pounds since his last visit.  We will set him up to see nutrition and regular follow-up with nutrition as recommended.  He tells me that he cannot  remember to eat.  We have discussed about setting up reminders on his smart phone to remember and to eat breakfast lunch and dinner.  Encouraged supplements such as Ensure or boost as well.  Goals of care, counseling/discussion He understands that the intent of treatment is palliative.  We have also discussed about establishing a healthcare power of attorney if he were unable to make any decisions.  He would like his friend and needs to be the healthcare power of attorney.  We will arrange an appointment with social worker to get this paperwork taken care of.  I have engaged nurse navigator to arrange for nutrition and social worker visits.   No orders of the defined types were placed in this encounter.     Benay Elenbaas, MD 01/20/2021 4:43 PM  INTERVAL HISTORY:  Mr Podolsky is here for a follow up by himself.  Since last visit he had his port placed.   He is ready for chemotherapy.  He however does not remember most of the things we tell him.  He could not remember what he ate for breakfast or lunch.  He has a friend named [name] who helps bring him to the appointments.  He denies any new health symptoms, lost about 6 pounds of weight, mostly because he cannot remember to eat food.   Rest of the pertinent 10 point ROS reviewed and negative  I have reviewed the past medical history, past surgical history, social history and family history with the patient and they are unchanged from previous note.  ALLERGIES:  has No Known Allergies.  MEDICATIONS:  Current Outpatient Medications  Medication Sig Dispense Refill  atorvastatin (LIPITOR) 10 MG tablet Take 10 mg by mouth at bedtime.     bifidobacterium infantis (ALIGN) capsule Take 1 capsule by mouth daily.     cetirizine (ZYRTEC) 10 MG tablet Take 10 mg by mouth daily.     clonazePAM (KLONOPIN) 2 MG tablet Take 2 mg by mouth at bedtime.     dexamethasone (DECADRON) 4 MG tablet Take 2 tablets (8 mg total) by mouth daily. Start the day after  chemotherapy for 2 days. 30 tablet 1   finasteride (PROSCAR) 5 MG tablet Take 5 mg by mouth daily.     fluticasone (FLONASE) 50 MCG/ACT nasal spray Place 2 sprays into both nostrils as needed for allergies.     lidocaine-prilocaine (EMLA) cream Apply to affected area once 30 g 3   metoprolol tartrate (LOPRESSOR) 50 MG tablet Take 50 mg by mouth 2 (two) times daily.     omeprazole (PRILOSEC) 20 MG capsule Take 20 mg by mouth daily.     ondansetron (ZOFRAN) 8 MG tablet Take 1 tablet (8 mg total) by mouth 2 (two) times daily as needed for refractory nausea / vomiting. Start on day 3 after chemo. 30 tablet 1   PARoxetine (PAXIL) 20 MG tablet Take 20 mg by mouth daily.     prochlorperazine (COMPAZINE) 10 MG tablet Take 1 tablet (10 mg total) by mouth every 6 (six) hours as needed (Nausea or vomiting). 30 tablet 1   tamsulosin (FLOMAX) 0.4 MG CAPS capsule Take 0.4 mg by mouth at bedtime.     traZODone (DESYREL) 100 MG tablet Take 100 mg by mouth at bedtime.     No current facility-administered medications for this visit.    SUMMARY OF ONCOLOGIC HISTORY: Oncology History Overview Note  HPV positive   Squamous cell carcinoma of base of tongue (HCC)  03/03/2020 Imaging   CT neck  2.5 x 1.9 x 3.0 cm right base of tongue and glossotonsillar sulcus mass extending to the region of the right vallecula, highly suspicious for a squamous cell carcinoma primary tumor. ENT consultation and direct visualization recommended.   Extensive bilateral cystic/necrotic nodal metastatic disease at the right level 1, 2, 3 and 4 stations and left level 2 station. Several lymph nodes demonstrate ill-defined margins highly suspicious for extracapsular extension.   Lymphadenopathy significantly narrows the upper internal jugular veins bilaterally.   Paranasal sinus disease as described.   03/13/2020 Pathology Results   SURGICAL PATHOLOGY   FINAL MICROSCOPIC DIAGNOSIS:   A. TONGUE, RIGHT BASE MASS, BIOPSY:  -  Squamous cell carcinoma.  - See comment.   COMMENT:  Immunohistochemistry will be performed and reported as an addendum.   ADDENDUM: Immunohistochemistry for p16 is strongly and diffusely positive.    03/25/2020 Initial Diagnosis   Squamous cell carcinoma of base of tongue ([place])   03/25/2020 Cancer Staging   Staging form: Pharynx - HPV-Mediated Oropharynx, AJCC 8th Edition - Clinical stage from 03/25/2020: Stage IV (rcT3, cN2, pM1, p16+) - Signed by Heath Lark, MD on 12/17/2020 Stage prefix: Recurrence    04/10/2020 PET scan   1. Hypermetabolic mass of the right tongue base and palatine tonsillar region, with extensive hypermetabolic right neck adenopathy and some hypermetabolic left neck adenopathy as well as a small but hypermetabolic right supraclavicular lymph node as detailed above. 2. Questionable focus of accentuated activity along the dome of the liver, probably incidental. If the patient has abnormal liver enzymes or if otherwise clinically warranted, hepatic protocol MRI with and without contrast could be  utilized to assess for an actual underlying lesion in this region. 3. Accentuated anorectal activity. This is most commonly physiologic, but correlate with patient's colon screening history in assessing whether further workup is indicated. 4. Other imaging findings of potential clinical significance: Chronic right maxillary sinusitis. Aortic Atherosclerosis (ICD10-I70.0). Coronary atherosclerosis with mild cardiomegaly. Prostatomegaly. Sludge versus small gallstones in the gallbladder.     04/27/2020 - 07/03/2020 Radiation Therapy   Radiation treatment dates:   04/27/2020 to 07/03/2020   Site/dose:   The patient received intensity modulated radiation therapy to the oropharynx and bilateral neck.   Beams/energy:   IMRT, 6 MV photons   Narrative: The patient tolerated radiation treatment relatively well.   However, he missed multiple treatments despite intensive navigation by our care  team and ultimately elected to stop radiation early [place].  He received 60 Gray in 30 fractions rather than the prescribed total dose of 70 Gray in 35 fractions   11/04/2020 PET scan   Marked interval response to therapy still with some increased metabolic activity associated with the mildly enlarged RIGHT neck lymph node.    Muscular activity elsewhere in the neck and sequela of post treatment change.   Unfortunately, there is evidence of hepatic metastatic disease and potential celiac nodal disease on the current study.   Findings of more focal uptake in the RIGHT colon. This finding is nonspecific but would consider correlation with recent colonoscopy if performed or with follow-up colonoscopy if not recently performed as warranted. Question of thickening though the colon is decompressed in this area.   Worsening of RIGHT maxillary sinus disease since previous imaging.   Cholelithiasis.   Aortic Atherosclerosis (ICD10-I70.0).   12/08/2020 Procedure   Ultrasound-guided biopsy of left liver mass as above.   12/14/2020 Pathology Results   - Metastatic carcinoma to liver, consistent with patient's clinical  history of primary head and neck squamous cell carcinoma   12/28/2020 - 12/28/2020 Chemotherapy   Patient is on Treatment Plan : HEAD/NECK Pembrolizumab + Carboplatin + 5FU q21d x 6 cycles / Pembrolizumab q21d     01/21/2021 -  Chemotherapy   Patient is on Treatment Plan : LUNG Carboplatin / Paclitaxel q7d     01/21/2021 -  Chemotherapy   Patient is on Treatment Plan : HEAD/NECK Pembrolizumab (200) q21d     Liver metastases ([place])  12/17/2020 Initial Diagnosis   Liver metastases ([place])   12/28/2020 - 12/28/2020 Chemotherapy   Patient is on Treatment Plan : HEAD/NECK Pembrolizumab + Carboplatin + 5FU q21d x 6 cycles / Pembrolizumab q21d     01/21/2021 -  Chemotherapy   Patient is on Treatment Plan : LUNG Carboplatin / Paclitaxel q7d     01/21/2021 -  Chemotherapy    Patient is on Treatment Plan : HEAD/NECK Pembrolizumab (200) q21d      Plan was to proceed with combination chemotherapy however he was non compliant with his appointments hence his plan was discontinued and he was sent back to me.  PHYSICAL EXAMINATION: ECOG PERFORMANCE STATUS: 2 - Symptomatic, <50% confined to bed  Vitals:   01/20/21 1554  BP: 102/65  Pulse: 63  Resp: 16  Temp: 97.6 F (36.4 C)  SpO2: 99%    Filed Weights   01/20/21 1554  Weight: 124 lb 11.2 oz (56.6 kg)   GENERAL:alert, no distress and comfortable.  He looks thin, weak and frail SKIN: skin color, texture, turgor are normal, no rashes or significant lesions EYES: normal, Conjunctiva are pink and non-injected,  sclera clear OROPHARYNX:no exudate, no erythema and lips, buccal mucosa, and tongue normal  NECK: supple, thyroid normal size, non-tender, without nodularity LYMPH:  no palpable lymphadenopathy in the cervical, axillary LUNGS: clear to auscultation and percussion with normal breathing effort HEART: regular rate & rhythm and no murmurs and no lower extremity edema ABDOMEN: abdomen soft, non-tender and normal bowel sounds Musculoskeletal:no cyanosis of digits and no clubbing  NEURO: alert & oriented x 3 with fluent speech, no focal motor/sensory deficits  LABORATORY DATA:  I have reviewed the data as listed    Component Value Date/Time   NA 140 01/07/2021 0916   K 3.9 01/07/2021 0916   CL 101 01/07/2021 0916   CO2 34 (H) 01/07/2021 0916   GLUCOSE 99 01/07/2021 0916   BUN 21 01/07/2021 0916   CREATININE 1.30 (H) 01/07/2021 0916   CREATININE 1.28 (H) 11/27/2020 1249   CALCIUM 9.5 01/07/2021 0916   PROT 6.5 01/07/2021 0916   ALBUMIN 3.9 01/07/2021 0916   AST 11 (L) 01/07/2021 0916   AST 11 (L) 11/27/2020 1249   ALT 8 01/07/2021 0916   ALT 8 11/27/2020 1249   ALKPHOS 68 01/07/2021 0916   BILITOT 0.4 01/07/2021 0916   BILITOT 0.7 11/27/2020 1249   GFRNONAA 58 (L) 01/07/2021 0916   GFRNONAA 59  (L) 11/27/2020 1249    No results found for: SPEP, UPEP  Lab Results  Component Value Date   WBC 7.0 01/07/2021   NEUTROABS 5.4 01/07/2021   HGB 11.5 (L) 01/07/2021   HCT 35.1 (L) 01/07/2021   MCV 92.4 01/07/2021   PLT 162 01/07/2021      Chemistry      Component Value Date/Time   NA 140 01/07/2021 0916   K 3.9 01/07/2021 0916   CL 101 01/07/2021 0916   CO2 34 (H) 01/07/2021 0916   BUN 21 01/07/2021 0916   CREATININE 1.30 (H) 01/07/2021 0916   CREATININE 1.28 (H) 11/27/2020 1249      Component Value Date/Time   CALCIUM 9.5 01/07/2021 0916   ALKPHOS 68 01/07/2021 0916   AST 11 (L) 01/07/2021 0916   AST 11 (L) 11/27/2020 1249   ALT 8 01/07/2021 0916   ALT 8 11/27/2020 1249   BILITOT 0.4 01/07/2021 0916   BILITOT 0.7 11/27/2020 1249      I spent 30 minutes in the care of this patient including review history, medical records, discussion about chemotherapy mechanism of action, adverse effects as well as immunotherapy we have also given him printed material regarding carboplatin, paclitaxel increased/.  Benay Laver MD

## 2021-01-20 NOTE — Assessment & Plan Note (Signed)
He understands that the intent of treatment is palliative.  We have also discussed about establishing a healthcare power of attorney if he were unable to make any decisions.  He would like his friend and needs to be the healthcare power of attorney.  We will arrange an appointment with social worker to get this paperwork taken care of.  I have engaged nurse navigator to arrange for nutrition and social worker visits.

## 2021-01-20 NOTE — Assessment & Plan Note (Signed)
He lost about 6 pounds since his last visit.  We will set him up to see nutrition and regular follow-up with nutrition as recommended.  He tells me that he cannot remember to eat.  We have discussed about setting up reminders on his smart phone to remember and to eat breakfast lunch and dinner.  Encouraged supplements such as Ensure or boost as well.

## 2021-01-21 ENCOUNTER — Other Ambulatory Visit: Payer: Medicare PPO

## 2021-01-21 ENCOUNTER — Telehealth: Payer: Self-pay | Admitting: *Deleted

## 2021-01-21 ENCOUNTER — Encounter: Payer: Self-pay | Admitting: General Practice

## 2021-01-21 ENCOUNTER — Telehealth: Payer: Self-pay | Admitting: Hematology and Oncology

## 2021-01-21 ENCOUNTER — Encounter: Payer: Self-pay | Admitting: Hematology and Oncology

## 2021-01-21 ENCOUNTER — Other Ambulatory Visit (HOSPITAL_COMMUNITY): Payer: Self-pay

## 2021-01-21 NOTE — Telephone Encounter (Signed)
Scheduled appointment per 01/04 los. Attempted to call patient several times about appointments and the urgency of appointments. Spoke with Emergency contact as well. She is aware of his appointments and said she would let patient know. She did voice that he needs transportation. Register communicate she will set that up for him. Will continue to follow up with patient about appointments.

## 2021-01-21 NOTE — Telephone Encounter (Signed)
Per discussion with social work and navigator per pending chemo therapy with concern of safety of pt including pt's cognition and decision making, support in the home , known possible side effects and pt's ability to manage medications. Plan is for SW to follow up with appropriate resources for better assessment.  Dr Chryl Heck aware of above.

## 2021-01-21 NOTE — Progress Notes (Signed)
[place] CSW Progress Notes  Report made to [place], [facility] [employer].  Report made based on multiple concerns, stressed need to help patient with treatment adherence/attendance.  Will await their decision re whether they will accept the case and investigate.  Edwyna Shell, LCSW Clinical Social Worker Phone:  [phone]

## 2021-01-22 ENCOUNTER — Other Ambulatory Visit: Payer: Medicare PPO

## 2021-01-22 ENCOUNTER — Encounter: Payer: Self-pay | Admitting: Nutrition

## 2021-01-22 ENCOUNTER — Inpatient Hospital Stay: Payer: Medicare PPO | Admitting: Nutrition

## 2021-01-22 ENCOUNTER — Ambulatory Visit: Payer: Medicare PPO

## 2021-01-22 ENCOUNTER — Encounter: Payer: Self-pay | Admitting: General Practice

## 2021-01-22 ENCOUNTER — Telehealth: Payer: Self-pay | Admitting: Hematology and Oncology

## 2021-01-22 NOTE — Telephone Encounter (Signed)
Scheduled appointments per 01/04 los. Was advise to hold off on scheduling future appointments per md and rn.  Patient aware of upcoming appointments.

## 2021-01-22 NOTE — Progress Notes (Signed)
[place] CSW Progress Notes  Call from [place] at [place], APS has accepted the case and will begin their investigation.  Treatment team notified.   Edwyna Shell, LCSW Clinical Social Worker Phone:  [phone]

## 2021-01-22 NOTE — Progress Notes (Signed)
Patient's treatment plan changed.  He did not show up for nutrition appointment.  Patient rescheduled for Monday, January 16 during infusion.

## 2021-01-25 NOTE — Progress Notes (Signed)
Pharmacist Chemotherapy Monitoring - Initial Assessment    Anticipated start date: 02/02/20   The following has been reviewed per standard work regarding the patient's treatment regimen: The patient's diagnosis, treatment plan and drug doses, and organ/hematologic function Lab orders and baseline tests specific to treatment regimen  The treatment plan start date, drug sequencing, and pre-medications Prior authorization status  Patient's documented medication list, including drug-drug interaction screen and prescriptions for anti-emetics and supportive care specific to the treatment regimen The drug concentrations, fluid compatibility, administration routes, and timing of the medications to be used The patient's access for treatment and lifetime cumulative dose history, if applicable  The patient's medication allergies and previous infusion related reactions, if applicable   Changes made to treatment plan:  N/A  Follow up needed:  N/A   Larene Beach, RPH, 01/25/2021  12:15 PM

## 2021-01-27 ENCOUNTER — Telehealth: Payer: Self-pay | Admitting: Hematology and Oncology

## 2021-01-27 ENCOUNTER — Encounter: Payer: Self-pay | Admitting: General Practice

## 2021-01-27 NOTE — Telephone Encounter (Signed)
Scheduled appointment per 01/10 staff message. Left message.

## 2021-01-29 ENCOUNTER — Inpatient Hospital Stay: Payer: Medicare PPO

## 2021-01-29 MED FILL — Dexamethasone Sodium Phosphate Inj 100 MG/10ML: INTRAMUSCULAR | Qty: 1 | Status: AC

## 2021-02-01 ENCOUNTER — Inpatient Hospital Stay: Payer: Medicare PPO

## 2021-02-01 ENCOUNTER — Inpatient Hospital Stay: Payer: Medicare PPO | Admitting: Nutrition

## 2021-02-01 NOTE — Progress Notes (Signed)
Oncology Nurse Navigator Documentation   I called Mr. Velez this afternoon to remind him of his appointment with Dr. Chryl Heck tomorrow at 12:00 pm. He voiced his understanding of the appointment date and time and told me that he would be here. He knows my direct contact information to call me back with any questions or concerns.   [name] RN, BSN, OCN Head & Neck Oncology Nurse [place] at [facility] Phone # [phone]  Fax # [phone]

## 2021-02-02 ENCOUNTER — Inpatient Hospital Stay: Payer: Medicare PPO | Admitting: Hematology and Oncology

## 2021-02-03 ENCOUNTER — Telehealth: Payer: Self-pay | Admitting: Hematology and Oncology

## 2021-02-03 NOTE — Telephone Encounter (Signed)
Scheduled appointment per 01/17 staff message. Left message with details of the appointment.

## 2021-02-05 NOTE — Progress Notes (Signed)
Oncology Nurse Navigator Documentation   I called and left a voice mail with Jerry Cook reminding him of his appointment with Dr. Chryl Heck on 1/25 at 3:45. I left my direct contact information to call me back and verify he got my message.   [name] RN, BSN, OCN Head & Neck Oncology Nurse [place] at [facility] Phone # [phone]  Fax # [phone]

## 2021-02-05 NOTE — Progress Notes (Signed)
Oncology Nurse Navigator Documentation   I spoke with Mr. Jerry Cook to inform him of his appointment with Dr. Chryl Heck next week on 1/25 at 3:45. He gave permission to call his friend [name] with the appointment date/time. I called and spoke to [facility] and provided her with the appointment date/time and she voiced that she would be able to provide him transportation to the appointment. Mr. Seybold knows to call me if he has any questions.   [name] RN, BSN, OCN Head & Neck Oncology Nurse [place] at [facility] Phone # [phone]  Fax # [phone]

## 2021-02-10 ENCOUNTER — Other Ambulatory Visit: Payer: Self-pay

## 2021-02-10 ENCOUNTER — Encounter: Payer: Self-pay | Admitting: Hematology and Oncology

## 2021-02-10 ENCOUNTER — Inpatient Hospital Stay (HOSPITAL_BASED_OUTPATIENT_CLINIC_OR_DEPARTMENT_OTHER): Payer: Medicare PPO | Admitting: Hematology and Oncology

## 2021-02-10 DIAGNOSIS — R634 Abnormal weight loss: Secondary | ICD-10-CM | POA: Diagnosis not present

## 2021-02-10 DIAGNOSIS — R221 Localized swelling, mass and lump, neck: Secondary | ICD-10-CM | POA: Diagnosis not present

## 2021-02-10 DIAGNOSIS — Z7952 Long term (current) use of systemic steroids: Secondary | ICD-10-CM | POA: Diagnosis not present

## 2021-02-10 DIAGNOSIS — C01 Malignant neoplasm of base of tongue: Secondary | ICD-10-CM | POA: Diagnosis not present

## 2021-02-10 DIAGNOSIS — K8020 Calculus of gallbladder without cholecystitis without obstruction: Secondary | ICD-10-CM | POA: Diagnosis not present

## 2021-02-10 DIAGNOSIS — I700 Atherosclerosis of aorta: Secondary | ICD-10-CM | POA: Diagnosis not present

## 2021-02-10 DIAGNOSIS — I2510 Atherosclerotic heart disease of native coronary artery without angina pectoris: Secondary | ICD-10-CM | POA: Diagnosis not present

## 2021-02-10 DIAGNOSIS — I517 Cardiomegaly: Secondary | ICD-10-CM | POA: Diagnosis not present

## 2021-02-10 DIAGNOSIS — C787 Secondary malignant neoplasm of liver and intrahepatic bile duct: Secondary | ICD-10-CM | POA: Diagnosis not present

## 2021-02-10 NOTE — Progress Notes (Signed)
[facility] OFFICE PROGRESS NOTE  Patient Care Team: Shirline Frees, MD as PCP - General (Family Medicine) Eppie Gibson, MD as Consulting Physician (Radiation Oncology) Malmfelt, Stephani Police, RN as Oncology Nurse Navigator Benay Que, MD as Consulting Physician (Hematology and Oncology)  ASSESSMENT & PLAN:  Squamous cell carcinoma of base of tongue [facility]) Mr. Lawhorn arrived to the appointment today with his friend [name].  He no-show to his last appointment.  Since his last visit here, he continues to lose weight appears frail today and disheveled.  He had a port placed 3 weeks ago, still has the tape around it, no evidence of infection.  I have very clearly explained to Mr. Obryant and his friend [name] that he is not a candidate for any chemoimmunotherapy at this point given the ongoing weight loss, lack of social support and declining performance status.  We have discussed that back in October when he was first diagnosed with metastatic disease, we have offered chemotherapy however he continued to no-show to his appointments and hence the chemotherapy plan was discontinued.  He then showed up to my appointments couple times in a row and hence we agreed to try weekly CarboTaxol with [facility].  However every visit, he continues to lose weight, he cannot remember to eat, he has no social support and we have worried if he can handle chemotherapy. Social worker mentioned that he has been hoarding and has not been able to take care of himself at home and hence Adult Protective Services was engaged. I have very clearly discussed with him today that he is not a candidate for treatment anymore and it is best to pursue hospice and keep himself comfortable at the end of life.  I have encouraged him to put his affairs in order.  Since he already has his port placed and hospice may use it, we will leave the port in place.  He is agreeable to hospice order.    Orders Placed This Encounter   Procedures   Ambulatory referral to Hospice    Referral Priority:   Routine    Referral Type:   Consultation    Referral Reason:   Specialty Services Required    Requested Specialty:   Hospice Services    Number of Visits Requested:   1      Benay Westendorf, MD 02/10/2021 4:17 PM  INTERVAL HISTORY:  Mr Nix is here for a follow up with Ms. Denise.  Since his last visit, he continues to lose weight and appears a bit disheveled and smells of some urine.  He tells me that he cannot remember when he last ate solid food initially he said he was 2 days ago but then he thought it was yesterday.  Today he just drank a protein shake.  He had port placed about 3 weeks ago.  He did not show up to his last appointment, he tells me he could not remember when it was.  He is ready to have discussion about goals of care. Rest of the pertinent 10 point ROS reviewed and negative  I have reviewed the past medical history, past surgical history, social history and family history with the patient and they are unchanged from previous note.  ALLERGIES:  has No Known Allergies.  MEDICATIONS:  Current Outpatient Medications  Medication Sig Dispense Refill   atorvastatin (LIPITOR) 10 MG tablet Take 10 mg by mouth at bedtime.     bifidobacterium infantis (ALIGN) capsule Take 1 capsule by mouth daily.  cetirizine (ZYRTEC) 10 MG tablet Take 10 mg by mouth daily.     clonazePAM (KLONOPIN) 2 MG tablet Take 2 mg by mouth at bedtime.     dexamethasone (DECADRON) 4 MG tablet Take 2 tablets (8 mg total) by mouth daily. Start the day after chemotherapy for 2 days. 30 tablet 1   finasteride (PROSCAR) 5 MG tablet Take 5 mg by mouth daily.     fluticasone (FLONASE) 50 MCG/ACT nasal spray Place 2 sprays into both nostrils as needed for allergies.     lidocaine-prilocaine (EMLA) cream Apply to affected area once 30 g 3   metoprolol tartrate (LOPRESSOR) 50 MG tablet Take 50 mg by mouth 2 (two) times daily.     omeprazole  (PRILOSEC) 20 MG capsule Take 20 mg by mouth daily.     ondansetron (ZOFRAN) 8 MG tablet Take 1 tablet (8 mg total) by mouth 2 (two) times daily as needed for refractory nausea / vomiting. Start on day 3 after chemo. 30 tablet 1   PARoxetine (PAXIL) 20 MG tablet Take 20 mg by mouth daily.     prochlorperazine (COMPAZINE) 10 MG tablet Take 1 tablet (10 mg total) by mouth every 6 (six) hours as needed (Nausea or vomiting). 30 tablet 1   tamsulosin (FLOMAX) 0.4 MG CAPS capsule Take 0.4 mg by mouth at bedtime.     traZODone (DESYREL) 100 MG tablet Take 100 mg by mouth at bedtime.     No current facility-administered medications for this visit.    SUMMARY OF ONCOLOGIC HISTORY: Oncology History Overview Note  HPV positive   Squamous cell carcinoma of base of tongue (HCC)  03/03/2020 Imaging   CT neck  2.5 x 1.9 x 3.0 cm right base of tongue and glossotonsillar sulcus mass extending to the region of the right vallecula, highly suspicious for a squamous cell carcinoma primary tumor. ENT consultation and direct visualization recommended.   Extensive bilateral cystic/necrotic nodal metastatic disease at the right level 1, 2, 3 and 4 stations and left level 2 station. Several lymph nodes demonstrate ill-defined margins highly suspicious for extracapsular extension.   Lymphadenopathy significantly narrows the upper internal jugular veins bilaterally.   Paranasal sinus disease as described.   03/13/2020 Pathology Results   SURGICAL PATHOLOGY   FINAL MICROSCOPIC DIAGNOSIS:   A. TONGUE, RIGHT BASE MASS, BIOPSY:  - Squamous cell carcinoma.  - See comment.   COMMENT:  Immunohistochemistry will be performed and reported as an addendum.   ADDENDUM: Immunohistochemistry for p16 is strongly and diffusely positive.    03/25/2020 Initial Diagnosis   Squamous cell carcinoma of base of tongue ([place])   03/25/2020 Cancer Staging   Staging form: Pharynx - HPV-Mediated Oropharynx, AJCC 8th Edition -  Clinical stage from 03/25/2020: Stage IV (rcT3, cN2, pM1, p16+) - Signed by Heath Lark, MD on 12/17/2020 Stage prefix: Recurrence    04/10/2020 PET scan   1. Hypermetabolic mass of the right tongue base and palatine tonsillar region, with extensive hypermetabolic right neck adenopathy and some hypermetabolic left neck adenopathy as well as a small but hypermetabolic right supraclavicular lymph node as detailed above. 2. Questionable focus of accentuated activity along the dome of the liver, probably incidental. If the patient has abnormal liver enzymes or if otherwise clinically warranted, hepatic protocol MRI with and without contrast could be utilized to assess for an actual underlying lesion in this region. 3. Accentuated anorectal activity. This is most commonly physiologic, but correlate with patient's colon screening history in assessing whether  further workup is indicated. 4. Other imaging findings of potential clinical significance: Chronic right maxillary sinusitis. Aortic Atherosclerosis (ICD10-I70.0). Coronary atherosclerosis with mild cardiomegaly. Prostatomegaly. Sludge versus small gallstones in the gallbladder.     04/27/2020 - 07/03/2020 Radiation Therapy   Radiation treatment dates:   04/27/2020 to 07/03/2020   Site/dose:   The patient received intensity modulated radiation therapy to the oropharynx and bilateral neck.   Beams/energy:   IMRT, 6 MV photons   Narrative: The patient tolerated radiation treatment relatively well.   However, he missed multiple treatments despite intensive navigation by our care team and ultimately elected to stop radiation early [place].  He received 60 Gray in 30 fractions rather than the prescribed total dose of 70 Gray in 35 fractions   11/04/2020 PET scan   Marked interval response to therapy still with some increased metabolic activity associated with the mildly enlarged RIGHT neck lymph node.    Muscular activity elsewhere in the neck  and sequela of post treatment change.   Unfortunately, there is evidence of hepatic metastatic disease and potential celiac nodal disease on the current study.   Findings of more focal uptake in the RIGHT colon. This finding is nonspecific but would consider correlation with recent colonoscopy if performed or with follow-up colonoscopy if not recently performed as warranted. Question of thickening though the colon is decompressed in this area.   Worsening of RIGHT maxillary sinus disease since previous imaging.   Cholelithiasis.   Aortic Atherosclerosis (ICD10-I70.0).   12/08/2020 Procedure   Ultrasound-guided biopsy of left liver mass as above.   12/14/2020 Pathology Results   - Metastatic carcinoma to liver, consistent with patient's clinical  history of primary head and neck squamous cell carcinoma   12/28/2020 - 12/28/2020 Chemotherapy   Patient is on Treatment Plan : HEAD/NECK Pembrolizumab + Carboplatin + 5FU q21d x 6 cycles / Pembrolizumab q21d     01/21/2021 -  Chemotherapy   Patient is on Treatment Plan : HEAD/NECK Pembrolizumab (200) q21d     02/01/2021 -  Chemotherapy   Patient is on Treatment Plan : LUNG Carboplatin / Paclitaxel q7d     Liver metastases ([place])  12/17/2020 Initial Diagnosis   Liver metastases ([place])   12/28/2020 - 12/28/2020 Chemotherapy   Patient is on Treatment Plan : HEAD/NECK Pembrolizumab + Carboplatin + 5FU q21d x 6 cycles / Pembrolizumab q21d     01/21/2021 -  Chemotherapy   Patient is on Treatment Plan : HEAD/NECK Pembrolizumab (200) q21d     02/01/2021 -  Chemotherapy   Patient is on Treatment Plan : LUNG Carboplatin / Paclitaxel q7d      Plan was to proceed with combination chemotherapy however he was non compliant with his appointments hence his plan was discontinued and he was sent back to me.  PHYSICAL EXAMINATION: ECOG PERFORMANCE STATUS: 2 - Symptomatic, <50% confined to bed  Vitals:   02/10/21 1548  BP: (!) 141/78  Pulse: 98  Resp:  16  Temp: 97.9 F (36.6 C)  SpO2: 99%    Filed Weights   02/10/21 1548  Weight: 120 lb 8 oz (54.7 kg)   Physical examination deferred in lieu of counseling LABORATORY DATA:  I have reviewed the data as listed    Component Value Date/Time   NA 140 01/07/2021 0916   K 3.9 01/07/2021 0916   CL 101 01/07/2021 0916   CO2 34 (H) 01/07/2021 0916   GLUCOSE 99 01/07/2021 0916   BUN 21 01/07/2021 0916  CREATININE 1.30 (H) 01/07/2021 0916   CREATININE 1.28 (H) 11/27/2020 1249   CALCIUM 9.5 01/07/2021 0916   PROT 6.5 01/07/2021 0916   ALBUMIN 3.9 01/07/2021 0916   AST 11 (L) 01/07/2021 0916   AST 11 (L) 11/27/2020 1249   ALT 8 01/07/2021 0916   ALT 8 11/27/2020 1249   ALKPHOS 68 01/07/2021 0916   BILITOT 0.4 01/07/2021 0916   BILITOT 0.7 11/27/2020 1249   GFRNONAA 58 (L) 01/07/2021 0916   GFRNONAA 59 (L) 11/27/2020 1249    No results found for: SPEP, UPEP  Lab Results  Component Value Date   WBC 7.0 01/07/2021   NEUTROABS 5.4 01/07/2021   HGB 11.5 (L) 01/07/2021   HCT 35.1 (L) 01/07/2021   MCV 92.4 01/07/2021   PLT 162 01/07/2021      Chemistry      Component Value Date/Time   NA 140 01/07/2021 0916   K 3.9 01/07/2021 0916   CL 101 01/07/2021 0916   CO2 34 (H) 01/07/2021 0916   BUN 21 01/07/2021 0916   CREATININE 1.30 (H) 01/07/2021 0916   CREATININE 1.28 (H) 11/27/2020 1249      Component Value Date/Time   CALCIUM 9.5 01/07/2021 0916   ALKPHOS 68 01/07/2021 0916   AST 11 (L) 01/07/2021 0916   AST 11 (L) 11/27/2020 1249   ALT 8 01/07/2021 0916   ALT 8 11/27/2020 1249   BILITOT 0.4 01/07/2021 0916   BILITOT 0.7 11/27/2020 1249      I spent 30 minutes in the care of this patient including review history, medical records, discussion about goals of care and hospice as mentioned in assessment and plan. Benay Kilduff MD

## 2021-02-10 NOTE — Assessment & Plan Note (Signed)
Jerry Cook arrived to the appointment today with his friend Jerry Cook.  He no-show to his last appointment.  Since his last visit here, he continues to lose weight appears frail today and disheveled.  He had a port placed 3 weeks ago, still has the tape around it, no evidence of infection.  I have very clearly explained to Mr. Schoenherr and his friend Jerry Cook that he is not a candidate for any chemoimmunotherapy at this point given the ongoing weight loss, lack of social support and declining performance status.  We have discussed that back in October when he was first diagnosed with metastatic disease, we have offered chemotherapy however he continued to no-show to his appointments and hence the chemotherapy plan was discontinued.  He then showed up to my appointments couple times in a row and hence we agreed to try weekly CarboTaxol with [facility].  However every visit, he continues to lose weight, he cannot remember to eat, he has no social support and we have worried if he can handle chemotherapy. Social worker mentioned that he has been hoarding and has not been able to take care of himself at home and hence Adult Protective Services was engaged. I have very clearly discussed with him today that he is not a candidate for treatment anymore and it is best to pursue hospice and keep himself comfortable at the end of life.  I have encouraged him to put his affairs in order.  Since he already has his port placed and hospice may use it, we will leave the port in place.  He is agreeable to hospice order.

## 2021-03-09 ENCOUNTER — Telehealth: Payer: Self-pay | Admitting: *Deleted

## 2021-03-09 DIAGNOSIS — C01 Malignant neoplasm of base of tongue: Secondary | ICD-10-CM

## 2021-03-09 DIAGNOSIS — Z95828 Presence of other vascular implants and grafts: Secondary | ICD-10-CM

## 2021-03-09 DIAGNOSIS — K639 Disease of intestine, unspecified: Secondary | ICD-10-CM

## 2021-03-09 DIAGNOSIS — C787 Secondary malignant neoplasm of liver and intrahepatic bile duct: Secondary | ICD-10-CM

## 2021-03-09 NOTE — Telephone Encounter (Signed)
This RN spoke with pt per his call stating " I would  like to have this port removed "   He denies any acute issues " just bothering me'.  This RN discussed possible benefit for use if needed - with Jerry Cook stating he really would prefer "to have it out".  This RN verified above ok with MD- this RN also called Hospice/[facility] - and spoke with his nurse- Natasha Bence. He states pt informed him of above as well.  Note Aaron Edelman states pt is notably non compliant with availability with visits.  This RN verified pt's known issues of compliance with goal to provide appropriate care as best as the patient will allow.  This RN placed referral to IR for port removal.

## 2021-03-24 ENCOUNTER — Telehealth: Payer: Self-pay | Admitting: *Deleted

## 2021-03-24 NOTE — Telephone Encounter (Signed)
VM left by hospice ([facility]) Natasha Bence stating per visit with pt - " the patient request to revoke hospice services at this time " ? ?Aaron Edelman stated he discussed with pt and advised concern for care- pt restated he does not want hospice services. ? ?Return call number for Aaron Edelman is [phone]. ? ?This note will be forwarded to support services per concerns of his home environment and safety issues. ? ?This RN will also attempt to contact the patient to discuss his concerns and what expectations he has that this office can or cannot assist with. ? ? ?

## 2021-03-26 ENCOUNTER — Telehealth: Payer: Self-pay | Admitting: *Deleted

## 2021-03-26 NOTE — Telephone Encounter (Addendum)
-----   Message from Christeen Douglas, [place] sent at 03/25/2021 10:24 AM EST ----- ?I received Val's phone note on this pt and can reach out to APS to determine if they still have an open case.  Is there any thought on if you all will try to bring him back in here since he is declining hospice services? ? ? ?Sharyn Lull ? ?This RN attempted to call patient to discuss his care- phone rings 12 times then goes to automated message stating "your call cannot be completed at this time" with no transfer to a VM. ? ?This RN attempted call x 2. ? ?This note will be forwarded to MD and SW- APS referral may be most appropriate for safety of patient. ? ?

## 2021-03-29 ENCOUNTER — Telehealth: Payer: Self-pay | Admitting: Licensed Clinical Social Worker

## 2021-03-29 NOTE — Telephone Encounter (Signed)
CSW spoke with Jerry Cook at [facility]. Made report per concerns from medical team re: revoking hospice, unable to contact, safety concerns in home. APS will notify this CSW if report is accepted. ? ? ? ?Allison Deshotels E Harshini Trent, LCSW ?

## 2021-03-29 NOTE — Telephone Encounter (Signed)
[place] Clinical Social Work ? ?Attempted to contact [facility] APS to report concerns from medical team (see prior phone notes). All lines busy. Left VM with direct contact information. ? ? ?Jerry Cook E Kyliah Deanda, LCSW ?

## 2021-03-31 ENCOUNTER — Encounter (HOSPITAL_COMMUNITY): Payer: Self-pay

## 2021-03-31 ENCOUNTER — Emergency Department (HOSPITAL_COMMUNITY): Payer: Medicare Other

## 2021-03-31 ENCOUNTER — Other Ambulatory Visit: Payer: Self-pay

## 2021-03-31 ENCOUNTER — Inpatient Hospital Stay (HOSPITAL_COMMUNITY)
Admission: EM | Admit: 2021-03-31 | Discharge: 2021-04-01 | DRG: 951 | Disposition: A | Discharge status: Hospice | Payer: Medicare Other | Attending: Internal Medicine | Admitting: Internal Medicine

## 2021-03-31 DIAGNOSIS — I619 Nontraumatic intracerebral hemorrhage, unspecified: Secondary | ICD-10-CM | POA: Diagnosis present

## 2021-03-31 DIAGNOSIS — Z681 Body mass index (BMI) 19 or less, adult: Secondary | ICD-10-CM

## 2021-03-31 DIAGNOSIS — C01 Malignant neoplasm of base of tongue: Secondary | ICD-10-CM | POA: Diagnosis present

## 2021-03-31 DIAGNOSIS — Z20822 Contact with and (suspected) exposure to covid-19: Secondary | ICD-10-CM | POA: Diagnosis present

## 2021-03-31 DIAGNOSIS — E785 Hyperlipidemia, unspecified: Secondary | ICD-10-CM | POA: Diagnosis present

## 2021-03-31 DIAGNOSIS — R627 Adult failure to thrive: Secondary | ICD-10-CM | POA: Diagnosis present

## 2021-03-31 DIAGNOSIS — Z515 Encounter for palliative care: Principal | ICD-10-CM

## 2021-03-31 DIAGNOSIS — R64 Cachexia: Secondary | ICD-10-CM | POA: Diagnosis present

## 2021-03-31 DIAGNOSIS — C787 Secondary malignant neoplasm of liver and intrahepatic bile duct: Secondary | ICD-10-CM | POA: Diagnosis present

## 2021-03-31 DIAGNOSIS — R54 Age-related physical debility: Secondary | ICD-10-CM | POA: Diagnosis present

## 2021-03-31 DIAGNOSIS — I10 Essential (primary) hypertension: Secondary | ICD-10-CM | POA: Diagnosis present

## 2021-03-31 DIAGNOSIS — Z7189 Other specified counseling: Secondary | ICD-10-CM

## 2021-03-31 DIAGNOSIS — K219 Gastro-esophageal reflux disease without esophagitis: Secondary | ICD-10-CM | POA: Diagnosis present

## 2021-03-31 DIAGNOSIS — Z87891 Personal history of nicotine dependence: Secondary | ICD-10-CM

## 2021-03-31 DIAGNOSIS — E440 Moderate protein-calorie malnutrition: Secondary | ICD-10-CM | POA: Diagnosis present

## 2021-03-31 DIAGNOSIS — Z7401 Bed confinement status: Secondary | ICD-10-CM

## 2021-03-31 DIAGNOSIS — Z66 Do not resuscitate: Secondary | ICD-10-CM | POA: Diagnosis present

## 2021-03-31 DIAGNOSIS — I611 Nontraumatic intracerebral hemorrhage in hemisphere, cortical: Secondary | ICD-10-CM | POA: Diagnosis present

## 2021-03-31 DIAGNOSIS — Z79899 Other long term (current) drug therapy: Secondary | ICD-10-CM

## 2021-03-31 HISTORY — DX: Malignant (primary) neoplasm, unspecified: C80.1

## 2021-03-31 LAB — CBC WITH DIFFERENTIAL/PLATELET
Abs Immature Granulocytes: 0.06 10*3/uL (ref 0.00–0.07)
Basophils Absolute: 0 10*3/uL (ref 0.0–0.1)
Basophils Relative: 0 %
Eosinophils Absolute: 0 10*3/uL (ref 0.0–0.5)
Eosinophils Relative: 0 %
HCT: 40 % (ref 39.0–52.0)
Hemoglobin: 13.9 g/dL (ref 13.0–17.0)
Immature Granulocytes: 1 %
Lymphocytes Relative: 4 %
Lymphs Abs: 0.4 10*3/uL — ABNORMAL LOW (ref 0.7–4.0)
MCH: 30.9 pg (ref 26.0–34.0)
MCHC: 34.8 g/dL (ref 30.0–36.0)
MCV: 88.9 fL (ref 80.0–100.0)
Monocytes Absolute: 0.9 10*3/uL (ref 0.1–1.0)
Monocytes Relative: 7 %
Neutro Abs: 10.5 10*3/uL — ABNORMAL HIGH (ref 1.7–7.7)
Neutrophils Relative %: 88 %
Platelets: 338 10*3/uL (ref 150–400)
RBC: 4.5 MIL/uL (ref 4.22–5.81)
RDW: 12.8 % (ref 11.5–15.5)
WBC: 11.9 10*3/uL — ABNORMAL HIGH (ref 4.0–10.5)
nRBC: 0 % (ref 0.0–0.2)

## 2021-03-31 LAB — COMPREHENSIVE METABOLIC PANEL
ALT: 19 U/L (ref 0–44)
AST: 28 U/L (ref 15–41)
Albumin: 3.8 g/dL (ref 3.5–5.0)
Alkaline Phosphatase: 89 U/L (ref 38–126)
Anion gap: 20 — ABNORMAL HIGH (ref 5–15)
BUN: 37 mg/dL — ABNORMAL HIGH (ref 8–23)
CO2: 21 mmol/L — ABNORMAL LOW (ref 22–32)
Calcium: 9.4 mg/dL (ref 8.9–10.3)
Chloride: 99 mmol/L (ref 98–111)
Creatinine, Ser: 1.22 mg/dL (ref 0.61–1.24)
GFR, Estimated: >60 mL/min (ref >60)
Glucose, Bld: 89 mg/dL (ref 70–99)
Potassium: 3.5 mmol/L (ref 3.5–5.1)
Sodium: 140 mmol/L (ref 135–145)
Total Bilirubin: 1.2 mg/dL (ref 0.3–1.2)
Total Protein: 7.1 g/dL (ref 6.5–8.1)

## 2021-03-31 LAB — RESP PANEL BY RT-PCR (FLU A&B, COVID) ARPGX2
Influenza A by PCR: NEGATIVE
Influenza B by PCR: NEGATIVE
SARS Coronavirus 2 by RT PCR: NEGATIVE

## 2021-03-31 LAB — CBG MONITORING, ED: Glucose-Capillary: 99 mg/dL (ref 70–99)

## 2021-03-31 LAB — LACTIC ACID, PLASMA: Lactic Acid, Venous: 2.1 mmol/L (ref 0.5–1.9)

## 2021-03-31 MED ORDER — SODIUM CHLORIDE 0.9 % IV BOLUS
1000.0000 mL | Freq: Once | INTRAVENOUS | Status: AC
  Administered 2021-03-31: 1000 mL via INTRAVENOUS

## 2021-03-31 NOTE — ED Provider Notes (Signed)
?[facility] ?Provider Note ? ? ?CSN: [id number] ?Arrival date & time: 03/31/21  1952 ? ?  ? ?History ? ?Chief Complaint  ?Patient presents with  ? Altered Mental Status  ? ? ?Jerry Cook is a 71 y.o. male. ? ?HPI ? ?Patient with medical history including hypertension, GERD, squamous cell carcinoma, metastatic cancer presents with complaints of failure to thrive.  Patient states that his friend found him today in his house and they are concerned for his safety so they told him to come here for further evaluation.  He states that he has no complaints at this time.  He states that he lives with his dog at home, he has been without heat for the last 3 days, he states that he cannot remember the last  time he has eaten, he states that the reason why he is not eating because he just does not have an appetite, he denies any recent falls, no headaches, no change in vision, paresthesia or weakness of the upper or lower extremities.  He states that he can ambulate with a walker but generally remains bedbound.  Patient states that he has no living family,  he is not a DNR and is currently thinking about hospice care. ? ?I reviewed patient's chart he has metastatic disease from the tongue into the liver, he is not a candidate for chemotherapy at this time due to his multiple missed appointments as well as deconditioned state.  Per his oncologist, it was recommend that he be transferred hospice care, originally patient was agreement this plan but had a change in heart.  It was also noted that oncology team is concerned for patient's safety as he appeared very frail on his last visit and contacted Adult Protective Services case is pending at this time. ? ?Home Medications ?Prior to Admission medications   ?Medication Sig Start Date End Date Taking? Authorizing Provider  ?atorvastatin (LIPITOR) 10 MG tablet Take 10 mg by mouth at bedtime. 01/23/18  Yes [provider]  ?clonazePAM  (KLONOPIN) 2 MG tablet Take 2 mg by mouth at bedtime. 01/26/18  Yes [provider]  ?finasteride (PROSCAR) 5 MG tablet Take 5 mg by mouth daily.   Yes [provider]  ?fluticasone (FLONASE) 50 MCG/ACT nasal spray Place 2 sprays into both nostrils as needed for allergies.   Yes [provider]  ?metoprolol tartrate (LOPRESSOR) 50 MG tablet Take 50 mg by mouth 2 (two) times daily. 03/05/18  Yes [provider]  ?omeprazole (PRILOSEC) 20 MG capsule Take 20 mg by mouth daily as needed (heartburn). 03/05/18  Yes [provider]  ?PARoxetine (PAXIL) 20 MG tablet Take 20 mg by mouth daily.   Yes [provider]  ?tamsulosin (FLOMAX) 0.4 MG CAPS capsule Take 0.4 mg by mouth at bedtime.   Yes [provider]  ?traZODone (DESYREL) 100 MG tablet Take 100 mg by mouth at bedtime as needed for sleep. 03/07/18  Yes [provider]  ?triamterene-hydrochlorothiazide (MAXZIDE-25) 37.5-25 MG tablet Take 1 tablet by mouth daily. 01/11/21  Yes [provider]  ?dexamethasone (DECADRON) 4 MG tablet Take 2 tablets (8 mg total) by mouth daily. Start the day after chemotherapy for 2 days. ?Patient not taking: Reported on 04/01/2021 01/20/21   Benay Hackworth, MD  ?lidocaine-prilocaine (EMLA) cream Apply to affected area once ?Patient not taking: Reported on 04/01/2021 01/20/21   Benay Upshaw, MD  ?ondansetron (ZOFRAN) 8 MG tablet Take 1 tablet (8 mg total) by  mouth 2 (two) times daily as needed for refractory nausea / vomiting. Start on day 3 after chemo. ?Patient not taking: Reported on 04/01/2021 01/20/21   Benay Shiffman, MD  ?prochlorperazine (COMPAZINE) 10 MG tablet Take 1 tablet (10 mg total) by mouth every 6 (six) hours as needed (Nausea or vomiting). ?Patient not taking: Reported on 04/01/2021 01/20/21   Benay Burich, MD  ?   ? ?Allergies    ?Patient has no known allergies.   ? ?Review of Systems   ?Review of Systems  ?Constitutional:  Negative for chills and  fever.  ?Respiratory:  Negative for shortness of breath.   ?Cardiovascular:  Negative for chest pain.  ?Gastrointestinal:  Negative for abdominal pain.  ?Neurological:  Negative for headaches.  ? ?Physical Exam ?Updated Vital Signs ?BP (!) 150/76   Pulse 78   Temp 99.1 ?F (37.3 ?C) (Axillary)   Resp 15   Ht '5\' 9"'$  (1.753 m)   Wt 5.443 kg   SpO2 99%   BMI 1.77 kg/m?  ?Physical Exam ?Vitals and nursing note reviewed.  ?Constitutional:   ?   General: He is not in acute distress. ?   Appearance: He is ill-appearing.  ?   Comments: On evaluation patient is extremely frail appearing deconditioned state, ill-appearing.  ?HENT:  ?   Head: Normocephalic and atraumatic.  ?   Comments: No deformity to head present, no raccoon eyes battle sign present, ?   Nose: No congestion.  ?   Mouth/Throat:  ?   Mouth: Mucous membranes are dry.  ?   Pharynx: Oropharynx is clear. No oropharyngeal exudate or posterior oropharyngeal erythema.  ?Eyes:  ?   Extraocular Movements: Extraocular movements intact.  ?   Conjunctiva/sclera: Conjunctivae normal.  ?   Pupils: Pupils are equal, round, and reactive to light.  ?Cardiovascular:  ?   Rate and Rhythm: Regular rhythm. Tachycardia present.  ?   Pulses: Normal pulses.  ?   Heart sounds: No murmur heard. ?  No friction rub. No gallop.  ?Pulmonary:  ?   Effort: No respiratory distress.  ?   Breath sounds: No wheezing, rhonchi or rales.  ?Abdominal:  ?   Palpations: Abdomen is soft.  ?   Tenderness: There is no abdominal tenderness. There is no right CVA tenderness or left CVA tenderness.  ?Musculoskeletal:  ?   Right lower leg: No edema.  ?   Left lower leg: No edema.  ?   Comments: Spine was palpated was nontender to palpation no step-off deformities noted.  Patient is moving all 4 extremities out difficulty.  ?Skin: ?   General: Skin is warm and dry.  ?Neurological:  ?   Mental Status: He is alert.  ?   Cranial Nerves: Cranial nerves 2-12 are intact. No cranial nerve deficit.  ?   Sensory:  Sensation is intact.  ?   Comments: Patient is alert and oriented x4, cranial nerves II through XII grossly intact, no difficulty with word finding, a follow two-step commands, no unilateral weakness present.  ?Psychiatric:     ?   Mood and Affect: Mood normal.  ? ? ?ED Results / Procedures / Treatments   ?Labs ?(all labs ordered are listed, but only abnormal results are displayed) ?Labs Reviewed  ?CBC WITH DIFFERENTIAL/PLATELET - Abnormal; Notable for the following components:  ?    Result Value  ? WBC 11.9 (*)   ? Neutro Abs 10.5 (*)   ? Lymphs Abs 0.4 (*)   ? All  other components within normal limits  ?COMPREHENSIVE METABOLIC PANEL - Abnormal; Notable for the following components:  ? CO2 21 (*)   ? BUN 37 (*)   ? Anion gap 20 (*)   ? All other components within normal limits  ?LACTIC ACID, PLASMA - Abnormal; Notable for the following components:  ? Lactic Acid, Venous 2.1 (*)   ? All other components within normal limits  ?RESP PANEL BY RT-PCR (FLU A&B, COVID) [id number]  ?URINALYSIS, ROUTINE W REFLEX MICROSCOPIC  ?CBG MONITORING, ED  ? ? ?EKG ?EKG Interpretation ? ?Date/Time:  Wednesday March 31 2021 21:56:29 EDT ?Ventricular Rate:  177 ?PR Interval:    ?QRS Duration: 222 ?QT Interval:  328 ?QTC Calculation: 448 ?R Axis:   12 ?Text Interpretation: Undetermined rhythm with irregular rate Premature ventricular complexes Nonspecific T wave abnormality Artifact When compared with ECG of 03/13/2020, Irregular rhythm is now present Premature ventricular complexes are now present Nonspecific T wave abnormality is now present Confirmed by Delora Fuel ([id number]) on 04/01/2021 12:25:09 AM ? ?Radiology ?DG Chest 2 View ? ?Result Date: 03/31/2021 ?CLINICAL DATA:  Altered mental status. EXAM: CHEST - 2 VIEW COMPARISON:  Chest radiograph dated 03/03/2020. FINDINGS: Attended Port-A-Cath with tip at the cavoatrial junction. No focal consolidation, pleural effusion or pneumothorax. The cardiac silhouette is within normal limits.  Atherosclerotic calcification of the aorta. No acute osseous pathology. IMPRESSION: No active cardiopulmonary disease. Electronically Signed   By: Anner Crete M.D.   On: 03/31/2021 21:09  ? ?CT Head Wo Contrast ?

## 2021-03-31 NOTE — ED Notes (Signed)
Patient transported to CT scan . 

## 2021-03-31 NOTE — ED Triage Notes (Signed)
Pt has been at home by himself.  Today a friend went to check on him and she found him in a house with no electricity, unsure of when his last meal was, disheveled.  Wants to get him into [facility], a representative told her to take him to the er and they would find him a room after there is no acute medical issues found. ?

## 2021-03-31 NOTE — ED Notes (Signed)
Patient covered in stool. Cleaned up and brief put on patient. ?

## 2021-03-31 NOTE — ED Provider Triage Note (Signed)
Emergency Medicine Provider Triage Evaluation Note ? ?Jerry Cook , a 71 y.o. male  was evaluated in triage.  Pt complains of altered mental status. Pt lives at home by himself.  Friend checked on him and he was altered, doesn't appear to be able to care for himself.  House without electricity.   ? ?Review of Systems  ?Positive: Confusion, weak ?Negative: Cold sxs, cp, sob, abd pain, nv/d, headache, dysuria ? ?Physical Exam  ?BP 116/90 (BP Location: Right Arm)   Pulse (!) 104   Temp 99.1 ?F (37.3 ?C) (Axillary)   Resp 18   Ht '5\' 9"'$  (1.753 m)   Wt 5.443 kg   SpO2 99%   BMI 1.77 kg/m?  ?Gen:   Awake, no distress   ?Resp:  Normal effort  ?MSK:   Moves extremities without difficulty  ?Other:  disheveled ? ?Medical Decision Making  ?Medically screening exam initiated at 8:20 PM.  Appropriate orders placed.  Jerry Cook was informed that the remainder of the evaluation will be completed by another provider, this initial triage assessment does not replace that evaluation, and the importance of remaining in the ED until their evaluation is complete. ? ? ?  ?Domenic Moras, PA-C ?03/31/21 2025 ? ?

## 2021-03-31 NOTE — ED Notes (Signed)
Couldn't obtain temp ?

## 2021-04-01 ENCOUNTER — Telehealth: Payer: Self-pay | Admitting: *Deleted

## 2021-04-01 DIAGNOSIS — Z87891 Personal history of nicotine dependence: Secondary | ICD-10-CM | POA: Diagnosis not present

## 2021-04-01 DIAGNOSIS — R54 Age-related physical debility: Secondary | ICD-10-CM | POA: Diagnosis present

## 2021-04-01 DIAGNOSIS — E43 Unspecified severe protein-calorie malnutrition: Secondary | ICD-10-CM

## 2021-04-01 DIAGNOSIS — R64 Cachexia: Secondary | ICD-10-CM

## 2021-04-01 DIAGNOSIS — K219 Gastro-esophageal reflux disease without esophagitis: Secondary | ICD-10-CM | POA: Diagnosis present

## 2021-04-01 DIAGNOSIS — Z66 Do not resuscitate: Secondary | ICD-10-CM | POA: Diagnosis present

## 2021-04-01 DIAGNOSIS — E440 Moderate protein-calorie malnutrition: Secondary | ICD-10-CM | POA: Diagnosis present

## 2021-04-01 DIAGNOSIS — R627 Adult failure to thrive: Secondary | ICD-10-CM

## 2021-04-01 DIAGNOSIS — Z7189 Other specified counseling: Secondary | ICD-10-CM

## 2021-04-01 DIAGNOSIS — C787 Secondary malignant neoplasm of liver and intrahepatic bile duct: Secondary | ICD-10-CM

## 2021-04-01 DIAGNOSIS — I611 Nontraumatic intracerebral hemorrhage in hemisphere, cortical: Secondary | ICD-10-CM | POA: Diagnosis present

## 2021-04-01 DIAGNOSIS — Z515 Encounter for palliative care: Secondary | ICD-10-CM | POA: Diagnosis not present

## 2021-04-01 DIAGNOSIS — Z79899 Other long term (current) drug therapy: Secondary | ICD-10-CM | POA: Diagnosis not present

## 2021-04-01 DIAGNOSIS — C01 Malignant neoplasm of base of tongue: Secondary | ICD-10-CM | POA: Diagnosis present

## 2021-04-01 DIAGNOSIS — Z7401 Bed confinement status: Secondary | ICD-10-CM | POA: Diagnosis not present

## 2021-04-01 DIAGNOSIS — E785 Hyperlipidemia, unspecified: Secondary | ICD-10-CM | POA: Diagnosis present

## 2021-04-01 DIAGNOSIS — Z681 Body mass index (BMI) 19 or less, adult: Secondary | ICD-10-CM | POA: Diagnosis not present

## 2021-04-01 DIAGNOSIS — I619 Nontraumatic intracerebral hemorrhage, unspecified: Secondary | ICD-10-CM | POA: Diagnosis present

## 2021-04-01 DIAGNOSIS — R638 Other symptoms and signs concerning food and fluid intake: Secondary | ICD-10-CM

## 2021-04-01 DIAGNOSIS — I612 Nontraumatic intracerebral hemorrhage in hemisphere, unspecified: Secondary | ICD-10-CM

## 2021-04-01 DIAGNOSIS — I10 Essential (primary) hypertension: Secondary | ICD-10-CM | POA: Diagnosis present

## 2021-04-01 DIAGNOSIS — Z20822 Contact with and (suspected) exposure to covid-19: Secondary | ICD-10-CM | POA: Diagnosis present

## 2021-04-01 LAB — URINALYSIS, ROUTINE W REFLEX MICROSCOPIC
Bacteria, UA: NONE SEEN
Bilirubin Urine: NEGATIVE
Glucose, UA: NEGATIVE mg/dL
Ketones, ur: 20 mg/dL — AB
Nitrite: NEGATIVE
Protein, ur: 30 mg/dL — AB
Specific Gravity, Urine: 1.021 (ref 1.005–1.030)
pH: 5 (ref 5.0–8.0)

## 2021-04-01 MED ORDER — ACETAMINOPHEN 650 MG RE SUPP: 650.0000 mg | Freq: Four times a day (QID) | RECTAL | Status: DC | PRN

## 2021-04-01 MED ORDER — LORAZEPAM 2 MG/ML IJ SOLN: 0.5000 mg | INTRAMUSCULAR | 0 refills | Status: DC | PRN

## 2021-04-01 MED ORDER — ACETAMINOPHEN 325 MG PO TABS: 650.0000 mg | ORAL_TABLET | Freq: Four times a day (QID) | ORAL | Status: DC | PRN

## 2021-04-01 MED ORDER — GLYCOPYRROLATE 0.2 MG/ML IJ SOLN: 0.2000 mg | INTRAMUSCULAR | Status: DC | PRN

## 2021-04-01 MED ORDER — ONDANSETRON HCL 4 MG PO TABS: 4.0000 mg | ORAL_TABLET | Freq: Four times a day (QID) | ORAL | 0 refills | Status: DC | PRN

## 2021-04-01 MED ORDER — LORAZEPAM 2 MG/ML IJ SOLN
0.5000 mg | INTRAMUSCULAR | Status: DC | PRN
  Administered 2021-04-01: 1 mg via INTRAVENOUS
  Filled 2021-04-01: qty 1

## 2021-04-01 MED ORDER — ONDANSETRON HCL 4 MG/2ML IJ SOLN: 4.0000 mg | Freq: Four times a day (QID) | INTRAMUSCULAR | Status: DC | PRN

## 2021-04-01 MED ORDER — HYDROMORPHONE HCL 1 MG/ML IJ SOLN: 0.5000 mg | INTRAMUSCULAR | 0 refills | Status: DC | PRN

## 2021-04-01 MED ORDER — ONDANSETRON HCL 4 MG PO TABS: 4.0000 mg | ORAL_TABLET | Freq: Four times a day (QID) | ORAL | Status: DC | PRN

## 2021-04-01 MED ORDER — HYDROMORPHONE HCL 1 MG/ML IJ SOLN: 0.5000 mg | INTRAMUSCULAR | Status: DC | PRN

## 2021-04-01 NOTE — Assessment & Plan Note (Signed)
See above discussion

## 2021-04-01 NOTE — Progress Notes (Addendum)
Report called to Mancel Bale at [facility]. Patient now with [facility]AR  ?

## 2021-04-01 NOTE — H&P (Signed)
?History and Physical  ? ? ?Patient: Jerry Cook UDJ:[id number] DOB: [birth year] ?DOA: 03/31/2021 ?DOS: the patient was seen and examined on 04/01/2021 ?PCP: Shirline Frees, MD  ?Patient coming from: Home ? ?Chief Complaint:  ?Chief Complaint  ?Patient presents with  ? Altered Mental Status  ? ?HPI: Jerry Cook is a 71 y.o. 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 ? ?Pt not a candidate for further treatments of CA, poor functioning and status decline due to CA and oncology recd hospice as of Jan 2023.  APS consult due to inability to care for self. ? ?Pt had been reluctant to pursue hospice and remained at home until today when his friend convinced him that he really needed to go to hospice as he is unable to care for himself.  Thus he presented to ED. ? ? Patient states that his friend found him today in his house and they are concerned for his safety so they told him to come here for further evaluation.  He states that he has no complaints at this time.  He states that he lives with his dog at home, he has been without heat for the last 3 days, he states that he cannot remember the last  time he has eaten, he states that the reason why he is not eating because he just does not have an appetite, he denies any recent falls, no headaches, no change in vision, paresthesia or weakness of the upper or lower extremities.  He states that he can ambulate with a walker but generally remains bedbound.  Patient states that he has no living family. ? ?Pt found to have brain bleed today on CT scan. ? ?Pt remains AAO despite that (a bit difficulty with date but otherwise oriented to self, situation, president, etc). ? ?Remains understanding of what hospice, DNR/DNI is and wishes to pursue it. ?  ?Review of Systems: As mentioned in the history of present illness. All other systems reviewed and are negative. ?Past Medical History:  ?Diagnosis Date  ? Cancer  [facility])   ? GERD (gastroesophageal reflux disease)   ? Hyperlipidemia   ? Hypertension   ? ?Past Surgical History:  ?Procedure Laterality Date  ? EYE SURGERY Bilateral   ? cataract  ? IR IMAGING GUIDED PORT INSERTION  01/19/2021  ? LARYNGOSCOPY AND BRONCHOSCOPY N/A 03/13/2020  ? Procedure: Direct LARYNGOSCOPY WITH BX  AND ESPHOGOSCOPY;  Surgeon: Melida Quitter, MD;  Location: [place];  Service: ENT;  Laterality: N/A;  ? TONSILLECTOMY    ? ?Social History:  reports that he quit smoking about 42 years ago. His smoking use included cigarettes. He has never used smokeless tobacco. He reports that he does not currently use alcohol after a past usage of about 1.0 standard drink per week. He reports that he does not use drugs. ? ?No Known Allergies ? ?History reviewed. No pertinent family history. ? ?Prior to Admission medications   ?Medication Sig Start Date End Date Taking? Authorizing Provider  ?atorvastatin (LIPITOR) 10 MG tablet Take 10 mg by mouth at bedtime. 01/23/18  Yes [provider]  ?clonazePAM (KLONOPIN) 2 MG tablet Take 2 mg by mouth at bedtime. 01/26/18  Yes [provider]  ?finasteride (PROSCAR) 5 MG tablet Take 5 mg by mouth daily.   Yes [provider]  ?fluticasone (FLONASE) 50 MCG/ACT nasal spray Place 2 sprays into both nostrils as needed for allergies.   Yes [provider]  ?metoprolol tartrate (LOPRESSOR) 50 MG tablet Take 50 mg by mouth 2 (two) times daily. 03/05/18  Yes [provider]  ?omeprazole (PRILOSEC) 20 MG capsule Take 20 mg by mouth daily as needed (heartburn). 03/05/18  Yes [provider]  ?PARoxetine (PAXIL) 20 MG tablet Take 20 mg by mouth daily.   Yes [provider]  ?tamsulosin (FLOMAX) 0.4 MG CAPS capsule Take 0.4 mg by mouth at bedtime.   Yes [provider]  ?traZODone (DESYREL) 100 MG tablet Take 100 mg by mouth at bedtime as needed for sleep. 03/07/18  Yes [provider]  ?triamterene-hydrochlorothiazide  (MAXZIDE-25) 37.5-25 MG tablet Take 1 tablet by mouth daily. 01/11/21  Yes [provider]  ?dexamethasone (DECADRON) 4 MG tablet Take 2 tablets (8 mg total) by mouth daily. Start the day after chemotherapy for 2 days. ?Patient not taking: Reported on 04/01/2021 01/20/21   Benay Kwolek, MD  ?lidocaine-prilocaine (EMLA) cream Apply to affected area once ?Patient not taking: Reported on 04/01/2021 01/20/21   Benay Fingerhut, MD  ?ondansetron (ZOFRAN) 8 MG tablet Take 1 tablet (8 mg total) by mouth 2 (two) times daily as needed for refractory nausea / vomiting. Start on day 3 after chemo. ?Patient not taking: Reported on 04/01/2021 01/20/21   Benay Weekly, MD  ?prochlorperazine (COMPAZINE) 10 MG tablet Take 1 tablet (10 mg total) by mouth every 6 (six) hours as needed (Nausea or vomiting). ?Patient not taking: Reported on 04/01/2021 01/20/21   Benay Ferraz, MD  ? ? ?Physical Exam: ?Vitals:  ? 03/31/21 2300 03/31/21 2315 03/31/21 2330 03/31/21 2345  ?BP: 130/80 125/78 128/84 (!) 150/76  ?Pulse: 80 79 77 78  ?Resp:    15  ?Temp:      ?TempSrc:      ?SpO2: 98% 99% 99% 99%  ?Weight:      ?Height:      ? ?Constitutional: NAD, calm, ill appearing, deconditioned and frail. ?Eyes: PERRL, lids and conjunctivae normal ?ENMT: Mucous membranes are dry. Posterior pharynx clear of any exudate or lesions.Normal dentition.  ?Neck: normal, supple, no masses, no thyromegaly ?Respiratory: clear to auscultation bilaterally, no wheezing, no crackles. Normal respiratory effort. No accessory muscle use.  ?Cardiovascular: Regular rate and rhythm, no murmurs / rubs / gallops. No extremity edema. 2+ pedal pulses. No carotid bruits.  ?Abdomen: no tenderness, no masses palpated. No hepatosplenomegaly. Bowel sounds positive.  ?Musculoskeletal: no clubbing / cyanosis. No joint deformity upper and lower extremities. Good ROM, no contractures. Normal muscle tone.  ?Skin: no rashes, lesions, ulcers. No induration ?Neurologic: CN 2-12 grossly  intact. Sensation intact, DTR normal. Strength 5/5 in all 4.  ?Psychiatric: Normal judgment and insight. Alert and oriented x 3. Normal mood.  ? ?Data Reviewed: ? ?CT head showing ICH reviewed. ? ?Assessment and Plan: ?* ICH (intracerebral hemorrhage) ([place]) ?AAOx4 currently ?Not candidate for any surgeries per NS (Pt already was planned to be headed to hospice due to terminal metastatic cancer, see below). ?Pt agreeable with DNR/DNI status and pursuing hospice treatment at this time. ?Avoid blood thinners. ?Pal care consult. ?Diet as tolerated. ?Goals of care = patient comfort. ?IV dilaudid if needed for pain (though pt denies headache at this time). ? ?Squamous cell carcinoma of base of tongue (HCC) ?With mets to liver. ?? If brain met causing todays ICH? -> dont plan to pursue further work up / MRI however due to hospice plans. ?See Jan office note from Onc: very poor prognosis, decline, not candidate for further  treatments, and they referred him to hospice back in Jan.  He had been reluctant to pursue hospice until today he states. ?Poor performance at home, expressed interest in [facility], sent in to ED today. ?In ED found to have an [place] on top of everything else. ?Putting in [facility] care consult. ?Goals of care = pt comfort. ? ?Goals of care, counseling/discussion ?See above discussion ? ? ? ? ? Advance Care Planning:   Code Status: DNR ? ?Consults: Pal care consult put into Epic.  EDP called NS. ? ?Family Communication: No family in room ? ?Severity of Illness: ?The appropriate patient status for this patient is INPATIENT. Inpatient status is judged to be reasonable and necessary in order to provide the required intensity of service to ensure the patient's safety. The patient's presenting symptoms, physical exam findings, and initial radiographic and laboratory data in the context of their chronic comorbidities is felt to place them at high risk for further clinical deterioration. Furthermore, it is not  anticipated that the patient will be medically stable for discharge from the hospital within 2 midnights of admission.  ? ?* I certify that at the point of admission it is my clinical judgment that the patient will

## 2021-04-01 NOTE — Assessment & Plan Note (Addendum)
AAOx4 currently ?Not candidate for any surgeries per NS (Pt already was planned to be headed to hospice due to terminal metastatic cancer, see below). ?Pt agreeable with DNR/DNI status and pursuing hospice treatment at this time. ?Avoid blood thinners. ?Pal care consult. ?Diet as tolerated. ?Goals of care = patient comfort. ?IV dilaudid if needed for pain (though pt denies headache at this time). ?

## 2021-04-01 NOTE — TOC Progression Note (Addendum)
Transition of Care (TOC) - Progression Note  ? ? ?Patient Details  ?Name: Jerry Cook ?MRN: [id number] ?Date of Birth: [birth year] ? ?Transition of Care (TOC) CM/SW Contact  ?Angelita Ingles, RN ?Phone Number:[phone] ? ?04/01/2021, 12:22 PM ? ?Clinical Narrative:    ?CM at bedside to offer patient choice for Hospice services. Patient requesting Hospice services with [facility]. Shanita Wicker [address] with Hospice has been made aware.  ? ? ?  ?  ? ?Expected Discharge Plan and Services ?  ?  ?  ?  ?  ?                ?  ?  ?  ?  ?  ?  ?  ?  ?  ?  ? ? ?Social Determinants of Health (SDOH) Interventions ?  ? ?Readmission Risk Interventions ?No flowsheet data found. ? ?

## 2021-04-01 NOTE — Progress Notes (Signed)
?PROGRESS NOTE ? ?Jerry Cook  ?DOB: [birth year]  ?PCP: Shirline Frees, MD ?DPO:[id number]  ?DOA: 03/31/2021 ? LOS: 0 days  ?Hospital Day: 2 ? ?Brief narrative: ?Jerry Cook is a 71 y.o. 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 ?In January 2023, patient was recommended by oncology for hospice care because of advanced cancer, poor functional status.   ?Patient however is reluctant to pursue hospice and remain at home. ?On 3/15, friend went to check on him and she found him disheveled, with no exiting the house, unsure of when his last meal was.  She brought him to ED with an inclination to send him to a nursing home. ? ?In the ED, patient was afebrile, heart rate 114, blood pressure stable. ?He was alert, awake, slow to respond but oriented x3. ?CT scan of head showed a left temporal lobe hemorrhage along the left lateral ventricle temporal and posterior horn with no mass effect or midline shift. ?Patient was admitted to hospitalist service ?He was agreeable to hospice care. ? ?Subjective: ?Patient was seen and examined this morning.  Pleasant weak elderly Caucasian male.  Alert, awake, slow to respond.  Not in pain or distress.  On low-flow oxygen. ?Chart reviewed ?Tachycardia improved ? ?Principal Problem: ?  ICH (intracerebral hemorrhage) ([place]) ?Active Problems: ?  Squamous cell carcinoma of base of tongue (HCC) ?  Liver metastases ([place]) ?  Goals of care, counseling/discussion ?  Admission for end of life care ?  ? 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  He was recommended hospice at the time which patient was reluctant to and continued living at home alone. ?-Patient is very weak and unable to take care of himself at home now.  He was not even sure  when he last ate ?-Agreeable with hospice services ?-Social work consulted ?-Palliative care consulted ?-Comfort care started here.  IV morphine IV Ativan as needed ? ?ICH (intracerebral hemorrhage) ?-Noted in CT scan of head done in admission. ?-Not a candidate for any surgical intervention ?-Avoid blood thinners ? ?Goals of care ?  Code Status: DNR  ? ?  ?Diet:  ?Diet Order   ? ?       ?  Diet regular Room service appropriate? Yes; Fluid consistency: Thin  Diet effective now       ?  ? ?  ?  ? ?  ? ? ?DVT prophylaxis:  ?SCDs Start: 04/01/21 0018 ?  ?Antimicrobials: None ?Fluid: None ?Consultants: Palliative care ?Family Communication: No family at bedside ? ?Status is: Patient ? ?Continue in-hospital care because: Pending residential hospice ?Level of care: Progressive.  Can downgrade to MedSurg. ? ?Dispo: The patient is from: Home ?             Anticipated d/c is to: Likely residential hospice ?             Patient currently is not medically stable to d/c. ?  Difficult to place patient No ? ? ? ? ?Infusions:  ? ? ?Scheduled Meds: ? ? ?PRN meds: ?acetaminophen **OR** acetaminophen, HYDROmorphone (DILAUDID) injection, ondansetron **OR** ondansetron (ZOFRAN) IV  ? ?Antimicrobials: ?Anti-infectives (From admission, onward)  ? ? None  ? ?  ? ? ?Objective: ?Vitals:  ? 04/01/21  0145 04/01/21 0734  ?BP: (!) 145/76 136/83  ?Pulse: 73 (!) 112  ?Resp:  17  ?Temp: 97.9 ?F (36.6 ?C) 98.1 ?F (36.7 ?C)  ?SpO2: 96% 99%  ? ?No intake or output data in the 24 hours ending 04/01/21 0838 ?Filed Weights  ? 03/31/21 2016  ?Weight: 5.443 kg  ? ?Weight change:  ?Body mass index is 1.77 kg/m?.  ? ?Physical Exam: ?General exam: Pleasant elderly Caucasian male.  Not in physical distress.  Looks weak ?Skin: No rashes, lesions or ulcers. ?HEENT: Atraumatic, normocephalic, no obvious bleeding ?Lungs: Clear to auscultation bilaterally ?CVS: Regular rate and rhythm, no murmur ?GI/Abd soft, nontender, nondistended, bowel sound present ?CNS:  Alert, awake, slow to respond but oriented to place and person ?Psychiatry: Sad affect ?Extremities: No pedal edema, no calf tenderness ? ?Data Review: I have personally reviewed the laboratory data and studies available. ? ?F/u labs  ?Unresulted Labs (From admission, onward)  ? ? None  ? ?  ? ? ?Signed, ?Terrilee Croak, MD ?[facility] ?04/01/2021 ? ? ? ? ? ? ? ? ? ? ? ? ?

## 2021-04-01 NOTE — Consult Note (Signed)
? ?Palliative Care Consult Note  ?                                ?Date: 04/01/2021  ? ?Patient Name: Jerry Cook  ?DOB: [birth year]  MRN: [id number]  Age / Sex: 71 y.o., male  ?PCP: Shirline Frees, MD ?Referring Physician: Terrilee Croak, MD ? ?Reason for Consultation: Establishing goals of care ? ?HPI/Patient Profile: 71 y.o. 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 In January 2023, patient was recommended by oncology for hospice care because of advanced cancer, poor functional status.  However the patient was reluctant to pursue hospice and wanted to remain at home. On 3/15, friend went to check on him and she found him disheveled, with no exiting the house, unsure of when his last meal was.  She brought him to ED with an inclination to send him to a nursing home. After workup it was found he had an ICH. He was admitted on 03/31/2021 with SCC of base of tongue with mets to liver, FTT, unable to care for self at home, [place]. After discussion with hospitalist he was made DNR and admitted on comfort care. ? ?Noted previous poor prognosis, previous recommendation for hospice. Now very weak, unsure of when he last ate. Noted ICH on CT and not a candidate for surgical intervention. He is now agreeable to hospice care. ? ?PMT was consulted for [place] discussions.  ? ?Past Medical History:  ?Diagnosis Date  ? Cancer [facility])   ? GERD (gastroesophageal reflux disease)   ? Hyperlipidemia   ? Hypertension   ? ? ?Subjective:  ? ?This NP [place] reviewed medical records, received report from team, assessed the patient and then meet at the patient's bedside to discuss diagnosis, prognosis, GOC, EOL wishes disposition and options. ? ?I met with Today I met with the patient at the bedside along with Grandville Silos, RN (PMT RN).  . ?  ?Concept of Palliative Care was introduced as specialized medical care for  people and their families living with serious illness.  If focuses on providing relief from the symptoms and stress of a serious illness.  The goal is to improve quality of life for both the patient and the family. Values and goals of care important to patient and family were attempted to be elicited. ? ?Created space and opportunity for patient  and family to explore thoughts and feelings regarding current medical situation ?  ?Natural trajectory and current clinical status were discussed. Questions and concerns addressed. Patient  encouraged to call with questions or concerns.   ? ?Patient/Family Understanding of Illness: ?The patient knows he has cancer.  He feels like he is getting worse.  He admits worsening weakness, poor appetite (cannot remember the last time he ate), mostly bedbound and is not sure how he is even using the bathroom, frequent falls.  He notes that a friend comes to check on him at home, but cannot tell me how much.  He states he generally averages about 1 cup of water a day.  When we discussed further he agrees that he is nearing end-of-life.  He feels this personally because of everything that is gotten worse recently. ? ?Life Review: ?The patient currently lives at home with his she had to.  He has no family in the local area, no family to speak  of in general.  He does have a friend who comes to check on him, he thinks once a day. ? ?Goals: ?Hospice care ? ?Today's Discussion: ?The patient appears cachectic, malnourished, weak.  He answers questions in 1-2 word short answers.  He denies any overt pain, distress, nausea, vomiting.  We had a discussion about his cancer, underlying health issues including failure to thrive, and adequate care at home and now unable to care for himself, risks of increasing falls especially with noted ICH.  I asked him if it surprised him to hear that he is nearing end-of-life and he states no.  He knows this because "things have gotten worse".  He is not sure  exactly what hospice is but says his friend recommended it. ? ?We had a discussion about the hospice philosophy including shifting our focus of care from fixing what is "unfixable" and rather focusing our efforts on comfort, dignity, improve quality of life no matter how much time may be left.  I explained that there are medications that can be used to ensure that he is comfortable and at peace as he progresses toward end-of-life.  He agrees that he wants this.  We discussed that this would include no longer having lab tests, imaging tests, life-saving measures and he verbalized understanding and agreement to this. ? ?We also had an extensive discussion about trajectory of illness and his recent rapid decline which is not uncommon in cancer patients. ? ?I explained the settings that hospice can be delivered including home hospice, "home" hospice in a long-term care or assisted living facility, and residential hospice.  He notes that there is nobody to help care for him at home and his friend is only able to check on him once today.  He does not currently reside in a long-term care setting or assisted living facility.  We discussed residential hospice at the hospice home and how it "feels more like home than a facility" where they can provide adequate symptom management.  I recommended, based on his current status and recent rapid decline that he be considered for residential hospice and he agreed. ? ?I provided emotional general support through therapeutic listening, empathy, sharing of stories, and other techniques.  Answered all questions and addressed all concerns to the best of my ability. ? ?Review of Systems  ?Constitutional:   ?     Denies pain in general  ?Gastrointestinal:  Negative for nausea and vomiting.  ? ?Objective:  ? ?Primary Diagnoses: ?Present on Admission: ? ICH (intracerebral hemorrhage) ([place]) ? Squamous cell carcinoma of base of tongue (HCC) ? Liver metastases ([place]) ? ? ?Physical Exam ?Vitals  and nursing note reviewed.  ?Constitutional:   ?   General: He is not in acute distress. ?   Appearance: He is cachectic. He is ill-appearing.  ?   Comments: Appears very frail  ?Pulmonary:  ?   Effort: Pulmonary effort is normal.  ?Skin: ?   General: Skin is warm and dry.  ?Neurological:  ?   General: No focal deficit present.  ?   Mental Status: He is alert.  ?Psychiatric:     ?   Mood and Affect: Mood normal.     ?   Behavior: Behavior normal. Behavior is cooperative.  ? ? ?Vital Signs:  ?BP 136/83 (BP Location: Right Arm)   Pulse (!) 112   Temp 98.1 ?F (36.7 ?C) ([place])   Resp 17   Ht 5' 9"  (1.753 m)   Wt 5.443 kg  SpO2 99%   BMI 1.77 kg/m?  ? ?Palliative Assessment/Data: 20-30% ? ? ? ?Advanced Care Planning:  ? ?Primary Decision Maker: ?PATIENT ? ?Code Status/Advance Care Planning: ?DNR ? ?A discussion was had today regarding advanced directives. Concepts specific to code status, artifical feeding and hydration, continued IV antibiotics and rehospitalization was had.  The difference between a aggressive medical intervention path and a palliative comfort care path for this patient at this time was had.  ? ?Decisions/Changes to ACP: ?None today ? ?Assessment & Plan:  ? ?Impression: ?Very frail, cachectic 71 year old male with recent rapid decline in the setting of squamous cell carcinoma with metastasis to the liver.  He appears quite weak, has had falls lately.  He was found by a friend to be disheveled, no recent exited the home, unsure of last meal.  She recommended he come to the emergency department.  At this point he appears very malnourished, failure to thrive.  He is unable to tell me when his last meal was and notes overall poor appetite; BMI of 17 is consistent with this.  He generally only drinks about 1 glass of water a day (approximately 8 ounces?).  When asked, he is unable to tell me how he uses the bathroom.  He states he is mostly bedbound.  Has had falls recently.  Overall he feels that  he is approaching end-of-life and is now agreeable to hospice care.  Overall prognosis grim both short and long-term. ? ?++ Overall I feel he is appropriate for residential hospice given his rapid recent decline,

## 2021-04-01 NOTE — Assessment & Plan Note (Addendum)
With mets to liver. ?? If brain met causing todays ICH? -> dont plan to pursue further work up / MRI however due to hospice plans. ?See Jan office note from Onc: very poor prognosis, decline, not candidate for further treatments, and they referred him to hospice back in Jan.  He had been reluctant to pursue hospice until today he states. ?Poor performance at home, expressed interest in [facility], sent in to ED today. ?In ED found to have an [place] on top of everything else. ?Putting in [facility] care consult. ?Goals of care = pt comfort. ?

## 2021-04-01 NOTE — Progress Notes (Addendum)
Manufacturing engineer [facility]) Hospital Liaison Note ? ?Received request from [employer] [employer] [place]  for family interest in [facility]. Visited patient at bedside and spoke with  patient & friend/Denise/[phone]  to confirm interest and explain services. ? ?Approval for [facility] is determined by [facility] MD. Patient has been approved and a bed has been offered at [facility]. ? ? MSW has contacted [name] and is awaiting response on willingness to complete consents forms. Per PMT provider/Eric Gordy Levan, patient has the capacity to make decisions/sign consents if necessary.  ? ?Addendum: ? ?Consent forms have been completed by patient via docusign with [place] staff. ? ?PTAR notified of patient D/C and transport arranged. TOC/Ella and Attending Physician/Dr. Dahal also notified of transport arrangement.  ?  ?Please send signed DNR form with patient and RN call report to [phone].  ? ?Please do not hesitate to call with any hospice related questions.  ?  ?Thank you for the opportunity to participate in this patient's care. ? ?Daphene Calamity, MSW ?[facility] Liaison  ?[phone] ? ?

## 2021-04-01 NOTE — Discharge Summary (Signed)
? ?Physician Discharge Summary  ?Jerry Cook JSE:[id number] DOB: 1950-02-22 DOA: 03/31/2021 ? ?PCP: Shirline Frees, MD ? ?Admit date: 03/31/2021 ?Discharge date: 04/01/2021 ? ?Admitted From: home ?Discharge disposition: hospice ? ? ?Brief narrative: ?Jerry Cook is a 71 y.o. 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 ?In January 2023, patient was recommended by oncology for hospice care because of advanced cancer, poor functional status.   ?Patient however is reluctant to pursue hospice and remain at home. ?On 3/15, friend went to check on him and she found him disheveled, with no exiting the house, unsure of when his last meal was.  She brought him to ED with an inclination to send him to a nursing home. ? ?In the ED, patient was afebrile, heart rate 114, blood pressure stable. ?He was alert, awake, slow to respond but oriented x3. ?CT scan of head showed a left temporal lobe hemorrhage along the left lateral ventricle temporal and posterior horn with no mass effect or midline shift. ?Patient was admitted to hospitalist service ?He was agreeable to hospice care. ? ?Subjective: ?Patient was seen and examined this morning.  Pleasant weak elderly Caucasian male.  Alert, awake, slow to respond.  Not in pain or distress.  On low-flow oxygen. ?Chart reviewed ?Tachycardia improved ? ?Principal Problem: ?  ICH (intracerebral hemorrhage) ([place]) ?Active Problems: ?  Squamous cell carcinoma of base of tongue (HCC) ?  Liver metastases ([place]) ?  Goals of care, counseling/discussion ?  Admission for end of life care ?  ? 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  He was recommended hospice at the time which patient was reluctant to and continued living at home  alone. ?-Patient is very weak and unable to take care of himself at home now.  He was not even sure when he last ate ?-Agreeable with hospice services ?-Social work consulted ?-Palliative care consulted ?-Comfort care started here.  IV morphine IV Ativan as needed ? ?ICH (intracerebral hemorrhage) ?-Noted in CT scan of head done in admission. ?-Not a candidate for any surgical intervention ?-Avoid blood thinners ? ?Goals of care ?  Code Status: DNR  ? ?  ?Diet:  ?Diet Order   ? ?       ?  Diet regular Room service appropriate? Yes; Fluid consistency: Thin  Diet effective now       ?  ?  Diet general       ?  ? ?  ?  ? ?  ? ?Wounds:  ?- ?Incision (Closed) 03/13/20 N/A (Active)  ?Date First Assessed/Time First Assessed: 03/13/20 1326   Location: N/A  ?  ?Assessments 03/13/2020  1:45 PM 03/13/2020  2:30 PM  ?Dressing Type None None  ?Site / Wound Assessment -- Other (Comment)  ?   ?No Linked orders to display  ?   ?Incision (Closed) 01/19/21 Chest Right;Upper (Active)  ?Date First Assessed/Time First Assessed: 01/19/21 1505   Location: Chest  Location Orientation: Right;Upper  Present on Admission: No  ?  ?Assessments 01/19/2021  3:05 PM  ?Dressing Type Gauze (Comment);Transparent dressing  ?Dressing Clean;Dry;Intact  ?Site / Wound Assessment Clean;Dry  ?Closure Sutures;Skin glue  ?Drainage Amount None  ?   ?No Linked orders to display  ? ? ?  Discharge Exam:  ? ?Vitals:  ? 04/01/21 0100 04/01/21 0121 04/01/21 0145 04/01/21 0734  ?BP: 135/76  (!) 145/76 136/83  ?Pulse: 69  73 (!) 112  ?Resp: 16   17  ?Temp: 99.1 ?F (37.3 ?C) 99 ?F (37.2 ?C) 97.9 ?F (36.6 ?C) 98.1 ?F (36.7 ?C)  ?TempSrc:  Oral Oral Oral  ?SpO2: 100%  96% 99%  ?Weight:      ?Height:      ? ? ?Body mass index is 1.77 kg/m?.  ?General exam: Pleasant elderly Caucasian male.  Not in physical distress.  Looks weak ?Skin: No rashes, lesions or ulcers. ?HEENT: Atraumatic, normocephalic, no obvious bleeding ?Lungs: Clear to auscultation bilaterally ?CVS: Regular rate  and rhythm, no murmur ?GI/Abd soft, nontender, nondistended, bowel sound present ?CNS: Alert, awake, slow to respond but oriented to place and person ?Psychiatry: Sad affect ?Extremities: No pedal edema, no calf tenderness ? ?Follow ups:  ? ? Follow-up Information   ? ? Shirline Frees, MD Follow up.   ?Specialty: Family Medicine ?Contact information: ?[address] ?[address] ?[place] [address] ?[phone] ? ? ?  ?  ? ?  ?  ? ?  ? ? ?Discharge Instructions:  ? ?Discharge Instructions   ? ? Activity as tolerated - No restrictions   Complete by: As directed ?  ? Call MD for:   Complete by: As directed ?  ? Please get in touch with hospice MD/nurse for any symptom control.  ? Diet general   Complete by: As directed ?  ? If patient is alert and awake enough to eat, can allow luxury feeding.  ? Discharge instructions   Complete by: As directed ?  ? Medicines intended for comfort and pain management prescribed. ?Rest of the care per hospice policy.  ? ?  ? ? ?Discharge Medications:  ? ?Allergies as of 04/01/2021   ?No Known Allergies ?  ? ?  ?Medication List  ?  ? ?STOP taking these medications   ? ?atorvastatin 10 MG tablet ?Commonly known as: LIPITOR ?  ?clonazePAM 2 MG tablet ?Commonly known as: KLONOPIN ?  ?dexamethasone 4 MG tablet ?Commonly known as: DECADRON ?  ?finasteride 5 MG tablet ?Commonly known as: PROSCAR ?  ?fluticasone 50 MCG/ACT nasal spray ?Commonly known as: FLONASE ?  ?lidocaine-prilocaine cream ?Commonly known as: EMLA ?  ?metoprolol tartrate 50 MG tablet ?Commonly known as: LOPRESSOR ?  ?omeprazole 20 MG capsule ?Commonly known as: PRILOSEC ?  ?PARoxetine 20 MG tablet ?Commonly known as: PAXIL ?  ?prochlorperazine 10 MG tablet ?Commonly known as: COMPAZINE ?  ?tamsulosin 0.4 MG Caps capsule ?Commonly known as: FLOMAX ?  ?traZODone 100 MG tablet ?Commonly known as: DESYREL ?  ?triamterene-hydrochlorothiazide 37.5-25 MG tablet ?Commonly known as: MAXZIDE-25 ?  ? ?  ? ?TAKE these medications    ? ?glycopyrrolate 0.2 MG/ML injection ?Commonly known as: ROBINUL ?Inject 1 mL (0.2 mg total) into the vein every 4 (four) hours as needed (excessive secretions). ?  ?HYDROmorphone 1 MG/ML injection ?Commonly known as: DILAUDID ?Inject 0.5-1 mLs (0.5-1 mg total) into the vein every 2 (two) hours as needed for severe pain. ?  ?LORazepam 2 MG/ML injection ?Commonly known as: ATIVAN ?Inject 0.25-0.5 mLs (0.5-1 mg total) into the vein every 4 (four) hours as needed for anxiety (general distress). ?  ?ondansetron 4 MG tablet ?Commonly known as: ZOFRAN ?Take 1 tablet (4 mg total) by mouth every 6 (six) hours as needed for nausea. ?What changed:  ?medication strength ?how  much to take ?when to take this ?reasons to take this ?additional instructions ?  ? ?  ? ? ? ?The results of significant diagnostics from this hospitalization (including imaging, microbiology, ancillary and laboratory) are listed below for reference.   ? ?Procedures and Diagnostic Studies:  ? ?DG Chest 2 View ? ?Result Date: 03/31/2021 ?CLINICAL DATA:  Altered mental status. EXAM: CHEST - 2 VIEW COMPARISON:  Chest radiograph dated 03/03/2020. FINDINGS: Attended Port-A-Cath with tip at the cavoatrial junction. No focal consolidation, pleural effusion or pneumothorax. The cardiac silhouette is within normal limits. Atherosclerotic calcification of the aorta. No acute osseous pathology. IMPRESSION: No active cardiopulmonary disease. Electronically Signed   By: Anner Crete M.D.   On: 03/31/2021 21:09  ? ?CT Head Wo Contrast ? ?Result Date: 03/31/2021 ?CLINICAL DATA:  Delirium EXAM: CT HEAD WITHOUT CONTRAST TECHNIQUE: Contiguous axial images were obtained from the base of the skull through the vertex without intravenous contrast. RADIATION DOSE REDUCTION: This exam was performed according to the departmental dose-optimization program which includes automated exposure control, adjustment of the mA and/or kV according to patient size and/or use of iterative  reconstruction technique. COMPARISON:  None FINDINGS: Brain: There is hemorrhage within the left temporal lobe along the temporal and posterior horn of the left lateral ventricle. Bleed measures 5.3 x 1.5 x 1.5

## 2021-04-13 ENCOUNTER — Encounter: Payer: Self-pay | Admitting: Hematology and Oncology

## 2021-04-15 ENCOUNTER — Encounter: Payer: Self-pay | Admitting: Hematology and Oncology

## 2021-04-17 DEATH — deceased

## 2021-04-18 ENCOUNTER — Encounter: Payer: Self-pay | Admitting: Hematology and Oncology

## 2021-09-30 ENCOUNTER — Encounter: Payer: Self-pay | Admitting: Hematology and Oncology

## 2021-09-30 NOTE — Telephone Encounter (Signed)
No entry 

## 2022-11-06 IMAGING — PT NM PET TUM IMG INITIAL (PI) SKULL BASE T - THIGH
1 series · 17 of 17 positions shown · non-contrast
Comparison: CT neck from 03/03/2020

CLINICAL DATA: Initial treatment strategy for tongue base cancer,
biopsy on 03/13/2020.

EXAM:
NUCLEAR MEDICINE PET SKULL BASE TO THIGH
TECHNIQUE: 8.6 mCi F-18 FDG was injected intravenously. Full-ring PET imaging
was performed from the skull base to thigh after the radiotracer. CT
data was obtained and used for attenuation correction and anatomic
localization.
Fasting blood glucose: 112 mg/dl

[Series 1091: results mm oncology reading · 5.0mm · 0.81mm/px · 17 of 17 slices shown]
[im 1/17]
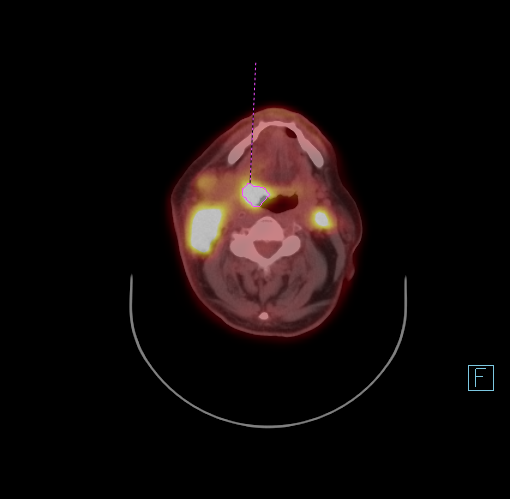
[im 2/17]
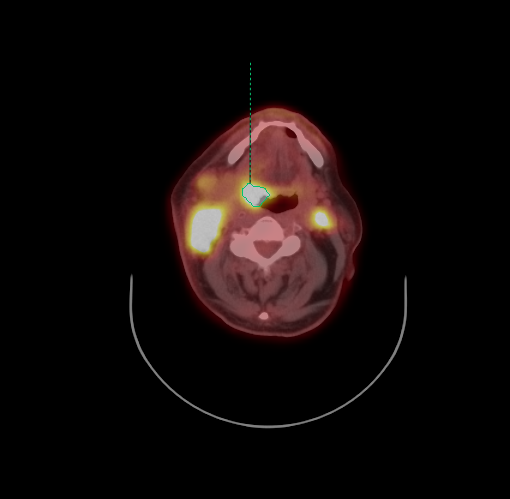
[im 3/17]
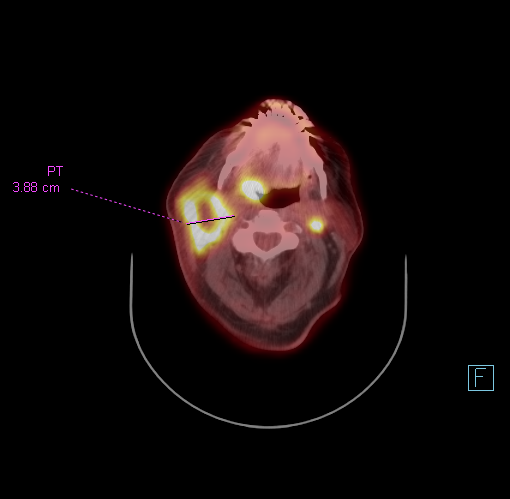
[im 4/17]
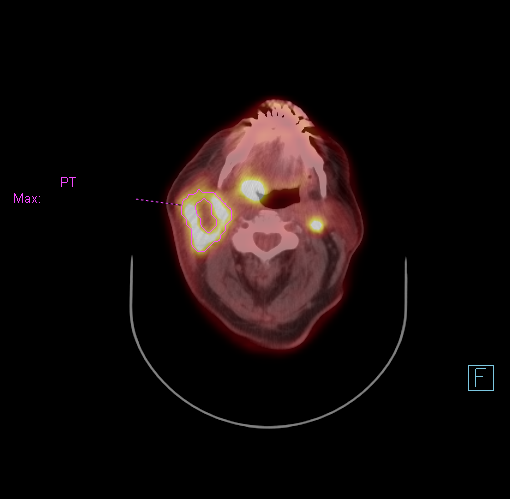
[im 5/17]
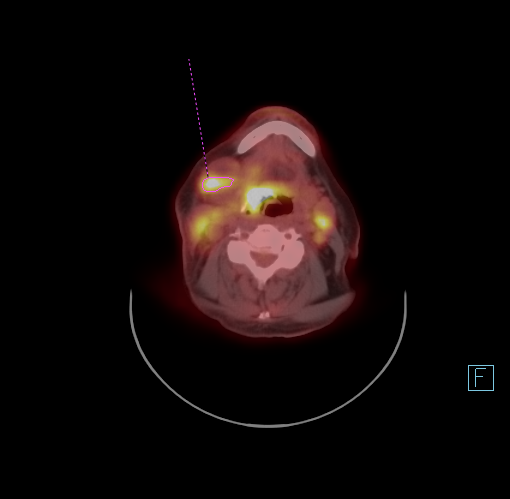
[im 6/17]
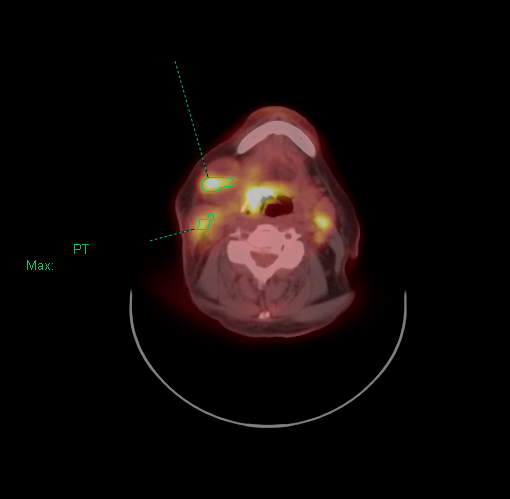
[im 7/17]
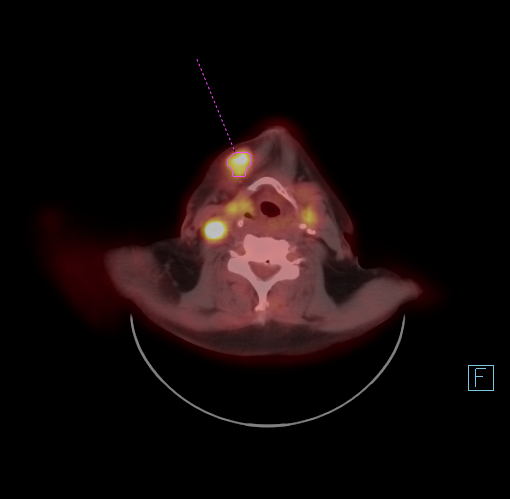
[im 8/17]
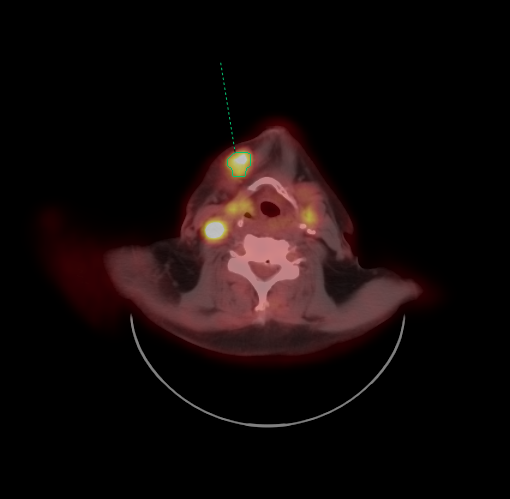
[im 9/17]
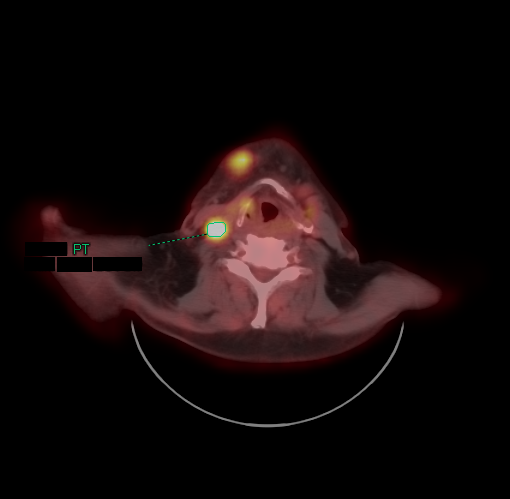
[im 10/17]
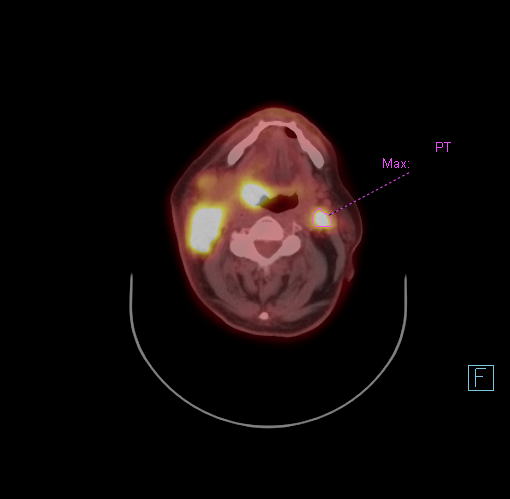
[im 11/17]
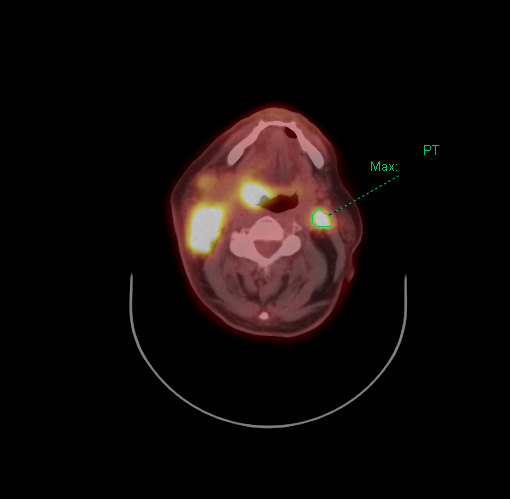
[im 12/17]
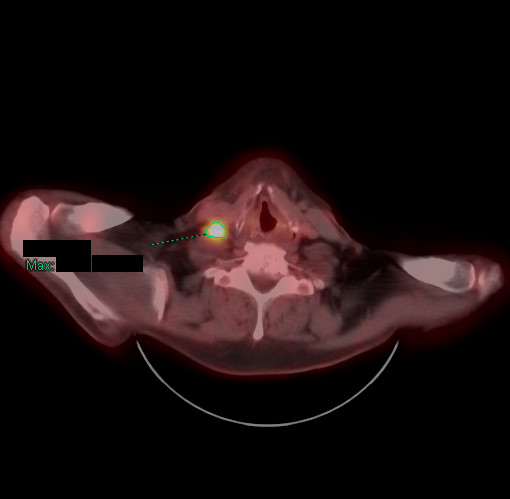
[im 13/17]
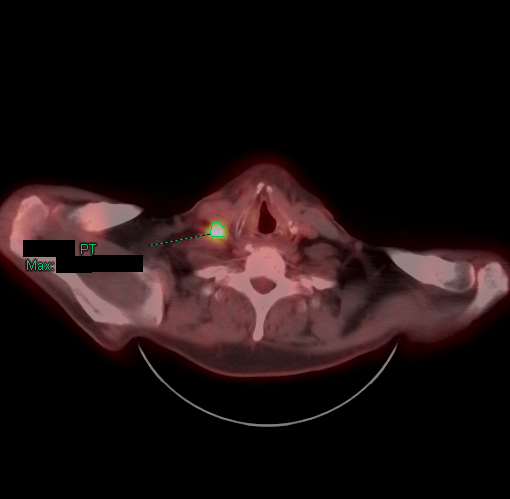
[im 14/17]
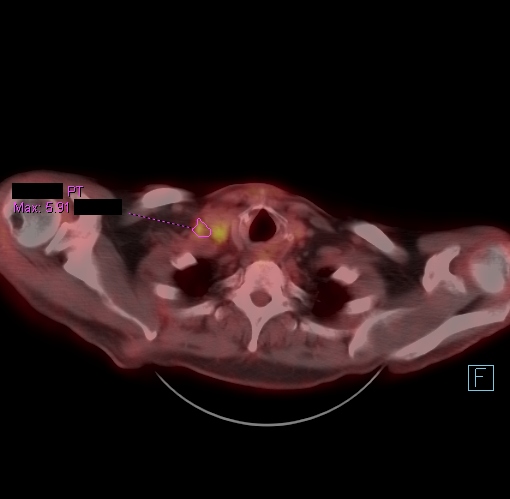
[im 15/17]
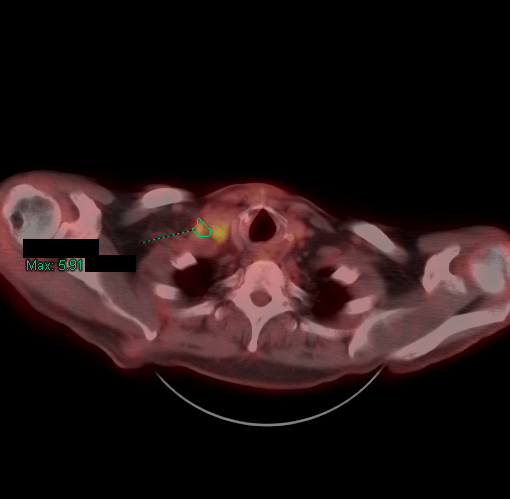
[im 16/17]
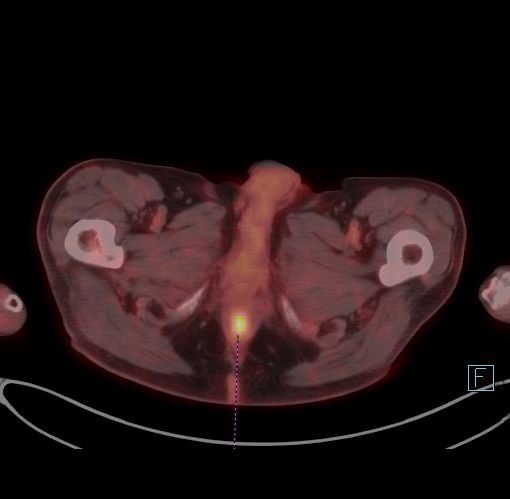
[im 17/17]
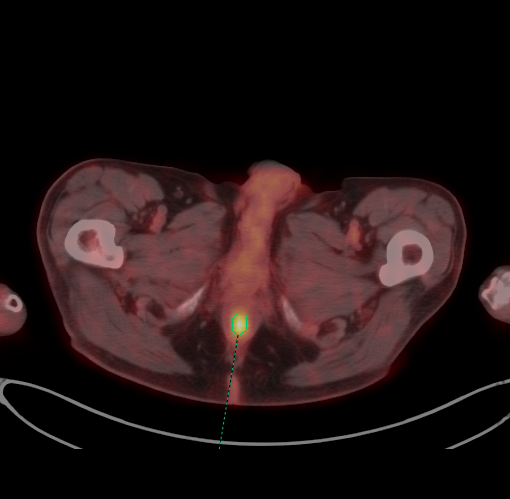

[17 of 17 positions shown; findings below may reference images not displayed]

FINDINGS: Mediastinal blood pool activity: SUV max

Liver activity: SUV max NA

NECK: A mass along the right tongue base and palatine tonsillar
region is observed with maximum SUV 18.6.

A centrally necrotic pathologic right level IIa lymph node has
activity with a short axis diameter of 3.9 cm, and a maximum
standard uptake value of

Right level IB lymph nodes are present with a posterior node
measuring 1.6 cm in short axis on image 40 of series 4 with maximum
SUV of 10.5, and a more anterior node measuring 1.8 cm in short axis
on image 44 of series 4 with a maximum SUV of 10.3.

A right level III lymph node measuring 1.5 cm in short axis on image
46 of series 4 has a maximum SUV of 17.6.

A left level IIa lymph node measuring 1.8 cm in short axis on image
39 series 4 has maximum SUV of 11.8. Other adjacent smaller
hypermetabolic left level II lymph nodes are present. There also
several additional smaller right level II and III lymph nodes

A right level IV lymph node measuring 1.3 cm in short axis on image
51 of series 4 has maximum SUV of 14.3.

Incidental CT findings: Chronic right maxillary sinusitis. Plate and
screw fixators along the maxilla and mandible.

CHEST: Right supraclavicular node measuring 0.7 cm in short axis on
image 56 series 4, maximum SUV 5.9. No other hypermetabolic thoracic
adenopathy.

Incidental CT findings: Atherosclerotic calcification of the
descending thoracic aorta, left main, left anterior descending, and
right coronary arteries. Mild cardiomegaly. Mild biapical
pleuroparenchymal scarring.

ABDOMEN/PELVIS: Along the dome of the right hepatic lobe, there is a
very questionable focus of hyperintensity with maximum SUV of
compared to background liver activity that has a typical SUV more
round 3.4. This could well be artifactual and there is no
corresponding abnormality on the CT data, with this questionable
focus well under 1 cm in diameter.

Accentuated activity in the anorectal region, maximum SUV 10.1, most
likely to be physiologic given the location, although correlation
with colon screening history is recommended.

Incidental CT findings: Postoperative findings at the
gastroesophageal junction. Dependent density in the gallbladder
compatible with sludge or small gallstones. Photopenic right renal
cysts. Aortoiliac atherosclerotic vascular disease. Sigmoid colon
diverticulosis. Prostatomegaly.

SKELETON: Scan accentuated activity in the right antecubital soft
tissues felt to be injection related. No significant bony
hypermetabolic activity.

Incidental CT findings: Lower cervical spondylosis.
IMPRESSION: 1. Hypermetabolic mass of the right tongue base and palatine
tonsillar region, with extensive hypermetabolic right neck
adenopathy and some hypermetabolic left neck adenopathy as well as a
small but hypermetabolic right supraclavicular lymph node as
detailed above.
2. Questionable focus of accentuated activity along the dome of the
liver, probably incidental. If the patient has abnormal liver
enzymes or if otherwise clinically warranted, hepatic protocol MRI
with and without contrast could be utilized to assess for an actual
underlying lesion in this region.
3. Accentuated anorectal activity. This is most commonly
physiologic, but correlate with patient's colon screening history in
assessing whether further workup is indicated.
4. Other imaging findings of potential clinical significance:
Chronic right maxillary sinusitis. Aortic Atherosclerosis
([id number]). Coronary atherosclerosis with mild cardiomegaly.
Prostatomegaly. Sludge versus small gallstones in the gallbladder.
